# Patient Record
Sex: Female | Born: 1935 | Race: White | Hispanic: No | Smoking: Never smoker
Health system: Southern US, Community
[De-identification: ages and names within clinical notes are randomized; demographics above are authoritative.]

## PROBLEM LIST (undated history)

## (undated) DIAGNOSIS — E039 Hypothyroidism, unspecified: Secondary | ICD-10-CM

## (undated) DIAGNOSIS — I5030 Unspecified diastolic (congestive) heart failure: Secondary | ICD-10-CM

## (undated) DIAGNOSIS — N289 Disorder of kidney and ureter, unspecified: Secondary | ICD-10-CM

## (undated) DIAGNOSIS — I779 Disorder of arteries and arterioles, unspecified: Secondary | ICD-10-CM

## (undated) DIAGNOSIS — J45909 Unspecified asthma, uncomplicated: Secondary | ICD-10-CM

## (undated) DIAGNOSIS — I480 Paroxysmal atrial fibrillation: Secondary | ICD-10-CM

## (undated) DIAGNOSIS — E785 Hyperlipidemia, unspecified: Secondary | ICD-10-CM

## (undated) DIAGNOSIS — I10 Essential (primary) hypertension: Secondary | ICD-10-CM

## (undated) DIAGNOSIS — Z974 Presence of external hearing-aid: Secondary | ICD-10-CM

## (undated) HISTORY — PX: APPENDECTOMY: SHX54

## (undated) HISTORY — DX: Paroxysmal atrial fibrillation: I48.0

## (undated) HISTORY — PX: ABDOMINAL HYSTERECTOMY: SHX81

## (undated) HISTORY — PX: OTHER SURGICAL HISTORY: SHX169

## (undated) HISTORY — DX: Unspecified diastolic (congestive) heart failure: I50.30

## (undated) HISTORY — DX: Disorder of arteries and arterioles, unspecified: I77.9

---

## 2001-08-15 ENCOUNTER — Encounter: Payer: Self-pay | Admitting: Neurosurgery

## 2001-08-18 ENCOUNTER — Encounter: Payer: Self-pay | Admitting: Neurosurgery

## 2001-08-18 ENCOUNTER — Inpatient Hospital Stay (HOSPITAL_COMMUNITY): Admission: RE | Admit: 2001-08-18 | Discharge: 2001-08-19 | Payer: Self-pay | Admitting: Neurosurgery

## 2003-09-11 ENCOUNTER — Other Ambulatory Visit: Payer: Self-pay

## 2004-04-23 ENCOUNTER — Ambulatory Visit: Payer: Self-pay | Admitting: Urology

## 2004-04-30 ENCOUNTER — Ambulatory Visit: Payer: Self-pay | Admitting: Urology

## 2004-06-18 ENCOUNTER — Ambulatory Visit: Payer: Self-pay | Admitting: Urology

## 2004-06-25 ENCOUNTER — Ambulatory Visit: Payer: Self-pay | Admitting: Urology

## 2004-10-12 ENCOUNTER — Emergency Department: Payer: Self-pay | Admitting: Unknown Physician Specialty

## 2004-10-22 ENCOUNTER — Ambulatory Visit: Payer: Self-pay | Admitting: Urology

## 2004-10-22 ENCOUNTER — Other Ambulatory Visit: Payer: Self-pay

## 2004-10-29 ENCOUNTER — Ambulatory Visit: Payer: Self-pay | Admitting: Urology

## 2005-06-16 ENCOUNTER — Ambulatory Visit: Payer: Self-pay | Admitting: Urology

## 2006-06-20 ENCOUNTER — Ambulatory Visit: Payer: Self-pay | Admitting: Urology

## 2007-06-19 ENCOUNTER — Ambulatory Visit: Payer: Self-pay | Admitting: Urology

## 2008-06-24 ENCOUNTER — Ambulatory Visit: Payer: Self-pay | Admitting: Urology

## 2009-07-02 ENCOUNTER — Ambulatory Visit: Payer: Self-pay | Admitting: Urology

## 2009-10-22 ENCOUNTER — Ambulatory Visit: Payer: Self-pay | Admitting: Family Medicine

## 2009-10-28 ENCOUNTER — Ambulatory Visit: Payer: Self-pay | Admitting: Internal Medicine

## 2009-11-11 ENCOUNTER — Ambulatory Visit: Payer: Self-pay | Admitting: Vascular Surgery

## 2009-11-19 ENCOUNTER — Inpatient Hospital Stay: Payer: Self-pay | Admitting: Vascular Surgery

## 2009-11-20 LAB — PATHOLOGY REPORT

## 2011-04-21 ENCOUNTER — Ambulatory Visit: Payer: Self-pay | Admitting: Urology

## 2011-10-27 ENCOUNTER — Ambulatory Visit: Payer: Self-pay | Admitting: Urology

## 2014-01-31 ENCOUNTER — Ambulatory Visit: Payer: Self-pay | Admitting: Unknown Physician Specialty

## 2017-07-12 ENCOUNTER — Emergency Department: Payer: Medicare Other

## 2017-07-12 ENCOUNTER — Other Ambulatory Visit: Payer: Self-pay

## 2017-07-12 ENCOUNTER — Observation Stay
Admission: EM | Admit: 2017-07-12 | Discharge: 2017-07-13 | Disposition: A | Discharge status: Home / Self Care | Payer: Medicare Other | Attending: Internal Medicine | Admitting: Internal Medicine

## 2017-07-12 DIAGNOSIS — Z23 Encounter for immunization: Secondary | ICD-10-CM | POA: Diagnosis not present

## 2017-07-12 DIAGNOSIS — I2721 Secondary pulmonary arterial hypertension: Secondary | ICD-10-CM | POA: Insufficient documentation

## 2017-07-12 DIAGNOSIS — I700 Atherosclerosis of aorta: Secondary | ICD-10-CM | POA: Insufficient documentation

## 2017-07-12 DIAGNOSIS — R0602 Shortness of breath: Secondary | ICD-10-CM

## 2017-07-12 DIAGNOSIS — R0902 Hypoxemia: Secondary | ICD-10-CM | POA: Diagnosis not present

## 2017-07-12 DIAGNOSIS — I110 Hypertensive heart disease with heart failure: Principal | ICD-10-CM | POA: Insufficient documentation

## 2017-07-12 DIAGNOSIS — I351 Nonrheumatic aortic (valve) insufficiency: Secondary | ICD-10-CM | POA: Diagnosis not present

## 2017-07-12 DIAGNOSIS — K8020 Calculus of gallbladder without cholecystitis without obstruction: Secondary | ICD-10-CM | POA: Insufficient documentation

## 2017-07-12 DIAGNOSIS — J209 Acute bronchitis, unspecified: Secondary | ICD-10-CM | POA: Insufficient documentation

## 2017-07-12 DIAGNOSIS — J40 Bronchitis, not specified as acute or chronic: Secondary | ICD-10-CM | POA: Diagnosis present

## 2017-07-12 DIAGNOSIS — K449 Diaphragmatic hernia without obstruction or gangrene: Secondary | ICD-10-CM | POA: Insufficient documentation

## 2017-07-12 DIAGNOSIS — E039 Hypothyroidism, unspecified: Secondary | ICD-10-CM | POA: Diagnosis not present

## 2017-07-12 DIAGNOSIS — I5031 Acute diastolic (congestive) heart failure: Secondary | ICD-10-CM | POA: Insufficient documentation

## 2017-07-12 DIAGNOSIS — Z79899 Other long term (current) drug therapy: Secondary | ICD-10-CM | POA: Diagnosis not present

## 2017-07-12 HISTORY — DX: Essential (primary) hypertension: I10

## 2017-07-12 LAB — CBC
HEMATOCRIT: 40.2 % (ref 35.0–47.0)
HEMOGLOBIN: 13.4 g/dL (ref 12.0–16.0)
MCH: 28.2 pg (ref 26.0–34.0)
MCHC: 33.4 g/dL (ref 32.0–36.0)
MCV: 84.4 fL (ref 80.0–100.0)
Platelets: 247 10*3/uL (ref 150–440)
RBC: 4.76 MIL/uL (ref 3.80–5.20)
RDW: 14.6 % — AB (ref 11.5–14.5)
WBC: 10.6 10*3/uL (ref 3.6–11.0)

## 2017-07-12 LAB — TROPONIN I: Troponin I: <0.03 ng/mL (ref <0.03)

## 2017-07-12 LAB — INFLUENZA PANEL BY PCR (TYPE A & B)
Influenza A By PCR: NEGATIVE
Influenza B By PCR: NEGATIVE

## 2017-07-12 LAB — BASIC METABOLIC PANEL
ANION GAP: 8 (ref 5–15)
BUN: 18 mg/dL (ref 6–20)
CO2: 26 mmol/L (ref 22–32)
Calcium: 9 mg/dL (ref 8.9–10.3)
Chloride: 108 mmol/L (ref 101–111)
Creatinine, Ser: 1.25 mg/dL — ABNORMAL HIGH (ref 0.44–1.00)
GFR calc Af Amer: 44 mL/min — ABNORMAL LOW (ref >60)
GFR calc non Af Amer: 38 mL/min — ABNORMAL LOW (ref >60)
GLUCOSE: 130 mg/dL — AB (ref 65–99)
Potassium: 4.1 mmol/L (ref 3.5–5.1)
Sodium: 142 mmol/L (ref 135–145)

## 2017-07-12 LAB — BRAIN NATRIURETIC PEPTIDE
B Natriuretic Peptide: 406 pg/mL — ABNORMAL HIGH (ref 0.0–100.0)
B Natriuretic Peptide: 435 pg/mL — ABNORMAL HIGH (ref 0.0–100.0)

## 2017-07-12 MED ORDER — ACETAMINOPHEN 650 MG RE SUPP: 650.0000 mg | Freq: Four times a day (QID) | RECTAL | Status: DC | PRN

## 2017-07-12 MED ORDER — CEFTRIAXONE SODIUM 1 G IJ SOLR
1.0000 g | Freq: Once | INTRAMUSCULAR | Status: AC
  Administered 2017-07-12: 1 g via INTRAVENOUS
  Filled 2017-07-12: qty 10

## 2017-07-12 MED ORDER — LEVOTHYROXINE SODIUM 100 MCG PO TABS
100.0000 ug | ORAL_TABLET | Freq: Every day | ORAL | Status: DC
  Administered 2017-07-12 – 2017-07-13 (×2): 100 ug via ORAL
  Filled 2017-07-12: qty 1
  Filled 2017-07-12: qty 2

## 2017-07-12 MED ORDER — HYDRALAZINE HCL 50 MG PO TABS
50.0000 mg | ORAL_TABLET | Freq: Three times a day (TID) | ORAL | Status: DC
  Administered 2017-07-12 – 2017-07-13 (×4): 50 mg via ORAL
  Filled 2017-07-12 (×4): qty 1

## 2017-07-12 MED ORDER — GUAIFENESIN ER 600 MG PO TB12
600.0000 mg | ORAL_TABLET | Freq: Two times a day (BID) | ORAL | Status: DC
  Administered 2017-07-12 – 2017-07-13 (×3): 600 mg via ORAL
  Filled 2017-07-12 (×5): qty 1

## 2017-07-12 MED ORDER — ALBUTEROL SULFATE (2.5 MG/3ML) 0.083% IN NEBU: 5.0000 mg | INHALATION_SOLUTION | Freq: Once | RESPIRATORY_TRACT | Status: DC

## 2017-07-12 MED ORDER — AZITHROMYCIN 500 MG PO TABS
250.0000 mg | ORAL_TABLET | Freq: Every day | ORAL | Status: DC
  Administered 2017-07-13: 250 mg via ORAL
  Filled 2017-07-12 (×2): qty 1

## 2017-07-12 MED ORDER — IPRATROPIUM-ALBUTEROL 0.5-2.5 (3) MG/3ML IN SOLN
RESPIRATORY_TRACT | Status: AC
  Administered 2017-07-12: 3 mL via RESPIRATORY_TRACT
  Filled 2017-07-12: qty 6

## 2017-07-12 MED ORDER — PNEUMOCOCCAL VAC POLYVALENT 25 MCG/0.5ML IJ INJ
0.5000 mL | INJECTION | INTRAMUSCULAR | Status: AC
  Administered 2017-07-13: 0.5 mL via INTRAMUSCULAR
  Filled 2017-07-12: qty 0.5

## 2017-07-12 MED ORDER — METHYLPREDNISOLONE SODIUM SUCC 125 MG IJ SOLR
125.0000 mg | Freq: Once | INTRAMUSCULAR | Status: AC
  Administered 2017-07-12: 125 mg via INTRAVENOUS
  Filled 2017-07-12: qty 2

## 2017-07-12 MED ORDER — IPRATROPIUM-ALBUTEROL 0.5-2.5 (3) MG/3ML IN SOLN
3.0000 mL | Freq: Once | RESPIRATORY_TRACT | Status: AC
Start: 2017-07-12 — End: 2017-07-12
  Administered 2017-07-12: 3 mL via RESPIRATORY_TRACT

## 2017-07-12 MED ORDER — ONDANSETRON HCL 4 MG/2ML IJ SOLN: 4.0000 mg | Freq: Four times a day (QID) | INTRAMUSCULAR | Status: DC | PRN

## 2017-07-12 MED ORDER — LISINOPRIL 20 MG PO TABS
40.0000 mg | ORAL_TABLET | Freq: Every day | ORAL | Status: DC
  Administered 2017-07-12 – 2017-07-13 (×2): 40 mg via ORAL
  Filled 2017-07-12: qty 4
  Filled 2017-07-12: qty 2

## 2017-07-12 MED ORDER — AMLODIPINE BESYLATE 10 MG PO TABS
10.0000 mg | ORAL_TABLET | Freq: Every day | ORAL | Status: DC
  Administered 2017-07-12 – 2017-07-13 (×2): 10 mg via ORAL
  Filled 2017-07-12: qty 1
  Filled 2017-07-12: qty 2

## 2017-07-12 MED ORDER — ACETAMINOPHEN 325 MG PO TABS: 650.0000 mg | ORAL_TABLET | Freq: Four times a day (QID) | ORAL | Status: DC | PRN

## 2017-07-12 MED ORDER — ENOXAPARIN SODIUM 40 MG/0.4ML ~~LOC~~ SOLN
40.0000 mg | SUBCUTANEOUS | Status: DC
  Administered 2017-07-12: 22:00:00 40 mg via SUBCUTANEOUS
  Filled 2017-07-12: qty 0.4

## 2017-07-12 MED ORDER — ATORVASTATIN CALCIUM 20 MG PO TABS
40.0000 mg | ORAL_TABLET | Freq: Every day | ORAL | Status: DC
  Administered 2017-07-13: 09:00:00 40 mg via ORAL
  Filled 2017-07-12: qty 2

## 2017-07-12 MED ORDER — ALBUTEROL SULFATE (2.5 MG/3ML) 0.083% IN NEBU: 2.5000 mg | INHALATION_SOLUTION | RESPIRATORY_TRACT | Status: DC | PRN

## 2017-07-12 MED ORDER — FUROSEMIDE 20 MG PO TABS
20.0000 mg | ORAL_TABLET | Freq: Every day | ORAL | Status: DC
  Administered 2017-07-12 – 2017-07-13 (×2): 20 mg via ORAL
  Filled 2017-07-12 (×2): qty 1

## 2017-07-12 MED ORDER — SODIUM CHLORIDE 0.9 % IV SOLN
500.0000 mg | Freq: Once | INTRAVENOUS | Status: AC
  Administered 2017-07-12: 500 mg via INTRAVENOUS
  Filled 2017-07-12: qty 500

## 2017-07-12 MED ORDER — ONDANSETRON HCL 4 MG PO TABS: 4.0000 mg | ORAL_TABLET | Freq: Four times a day (QID) | ORAL | Status: DC | PRN

## 2017-07-12 MED ORDER — SENNOSIDES-DOCUSATE SODIUM 8.6-50 MG PO TABS: 1.0000 | ORAL_TABLET | Freq: Every evening | ORAL | Status: DC | PRN

## 2017-07-12 MED ORDER — IOHEXOL 350 MG/ML SOLN
60.0000 mL | Freq: Once | INTRAVENOUS | Status: AC | PRN
  Administered 2017-07-12: 60 mL via INTRAVENOUS

## 2017-07-12 MED ORDER — CARVEDILOL 25 MG PO TABS
25.0000 mg | ORAL_TABLET | Freq: Two times a day (BID) | ORAL | Status: DC
  Administered 2017-07-12 – 2017-07-13 (×3): 25 mg via ORAL
  Filled 2017-07-12 (×3): qty 1

## 2017-07-12 NOTE — ED Provider Notes (Signed)
[facility] Emergency Department Provider Note    First MD Initiated Contact with Patient 07/12/17 [phone]     (approximate)  I have reviewed the triage vital signs and the nursing notes.   HISTORY  Chief Complaint Shortness of Breath    HPI Joan Willis is a 84 y.o. female presents to the emergency department 3-day history of progressive dyspnea, orthopnea, nonproductive cough nasal congestion.  Patient presents to the emergency department tachypnea, arrival to room the patient's oxygen saturation was 88% on room air 94% on nasal cannula.  Past Medical History Hypertension  There are no active problems to display for this patient.   Past surgical history Appendectomy Hysterectomy  Prior to Admission medications   Not on File    Allergies No known drug allergies No family history on file.  Social History Social History   Tobacco Use  . Smoking status: Not on file  Substance Use Topics  . Alcohol use: Not on file  . Drug use: Not on file    Review of Systems Constitutional: No fever/chills Eyes: No visual changes. ENT: No sore throat. Cardiovascular: Denies chest pain. Respiratory: Positive for shortness of breath. Gastrointestinal: No abdominal pain.  No nausea, no vomiting.  No diarrhea.  No constipation. Genitourinary: Negative for dysuria. Musculoskeletal: Negative for neck pain.  Negative for back pain. Integumentary: Negative for rash. Neurological: Negative for headaches, focal weakness or numbness.   ____________________________________________   PHYSICAL EXAM:  VITAL SIGNS: ED Triage Vitals [07/12/17 0518]  Enc Vitals Group     BP (!) 188/55     Pulse Rate 62     Resp (!) 22     Temp 98.2 F (36.8 C)     Temp Source Oral     SpO2 94 %     Weight 77.1 kg (170 lb)     Height 1.651 m (5\' 5" )     Head Circumference      Peak Flow      Pain Score 0     Pain Loc      Pain Edu?      Excl. in GC?      Constitutional: Alert and oriented. Well appearing and in no acute distress. Eyes: Conjunctivae are normal.  Head: Atraumatic. Mouth/Throat: Mucous membranes are moist.  Oropharynx non-erythematous. Neck: No stridor.   Cardiovascular: Normal rate, regular rhythm. Good peripheral circulation. Grossly normal heart sounds. Respiratory: Tachypnea, positive accessory respiratory muscle use, diffuse rhonchi and wheezing bilaterally. Gastrointestinal: Soft and nontender. No distention.  Musculoskeletal: No lower extremity tenderness nor edema. No gross deformities of extremities.  1+ bilateral lower extremity pitting edema Neurologic:  Normal speech and language. No gross focal neurologic deficits are appreciated.  Skin:  Skin is warm, dry and intact. No rash noted. Psychiatric: Mood and affect are normal. Speech and behavior are normal.  ____________________________________________   LABS (all labs ordered are listed, but only abnormal results are displayed)  Labs Reviewed  BASIC METABOLIC PANEL - Abnormal; Notable for the following components:      Result Value   Glucose, Bld 130 (*)    Creatinine, Ser 1.25 (*)    GFR calc non Af Amer 38 (*)    GFR calc Af Amer 44 (*)    All other components within normal limits  CBC - Abnormal; Notable for the following components:   RDW 14.6 (*)    All other components within normal limits  TROPONIN I  BRAIN NATRIURETIC PEPTIDE  ____________________________________________  EKG  ED ECG REPORT I, [place] Mand[place] N Beyla Loney, the attending physician, personally viewed and interpreted this ECG.   Date: 07/12/2017  EKG Time: 5:39 AM  Rate: 61  Rhythm: Normal sinus rhythm  Axis: Normal  Intervals: Normal  ST&T Change: None  ____________________________________________  RADIOLOGY I, Conroy N Jonesha Tsuchiya, personally viewed and evaluated these images (plain radiographs) as part of my medical decision making, as well as reviewing the written report by  the radiologist.  ED MD interpretation: Interstitial coarsening concerning for possible pneumonia versus bronchitis  Official radiology report(s): Dg Chest 2 View  Result Date: 07/12/2017 CLINICAL DATA:  84 year old female with shortness of breath. EXAM: CHEST - 2 VIEW COMPARISON:  Chest radiograph dated 11/11/2009 FINDINGS: There is diffuse chronic interstitial coarsening, and bronchitic changes. Mild interstitial and peribronchial nodularity noted which may represent superimposed pneumonia. Clinical correlation is recommended. There is no focal consolidation, pleural effusion, or pneumothorax. There is mild cardiomegaly. Atherosclerotic calcification of the aortic arch. No acute osseous pathology. IMPRESSION: Diffuse interstitial coarsening with possible superimposed infection. Clinical correlation is recommended. Electronically Signed   By: Elgie Collard M.D.   On: 07/12/2017 05:48     Procedures   ____________________________________________   INITIAL IMPRESSION / ASSESSMENT AND PLAN / ED COURSE  As part of my medical decision making, I reviewed the following data within the electronic MEDICAL RECORD NUMBER   Patient received 2 DuoNeb's with minimal improvement in dyspnea however patient's oxygen saturation improved currently 96%.  X-ray concerning for pneumonia versus bronchitis also consider the possibility of a pulmonary emboli and as such CT scan of the chest will be performed.  Patient discussed with Dr. Katheren Shams hospitalist on call.  Patient care transferred to Dr. Mayford Knife who will notify hospitalist when CT scan results are finalized.  ____________________________________________  FINAL CLINICAL IMPRESSION(S) / ED DIAGNOSES  Final diagnoses:  SOB (shortness of breath)     MEDICATIONS GIVEN DURING THIS VISIT:  Medications  cefTRIAXone (ROCEPHIN) 1 g in sodium chloride 0.9 % 100 mL IVPB (1 g Intravenous New Bag/Given 07/12/17 0717)  azithromycin (ZITHROMAX) 500 mg in sodium  chloride 0.9 % 250 mL IVPB (has no administration in time range)  ipratropium-albuterol (DUONEB) 0.5-2.5 (3) MG/3ML nebulizer solution 3 mL (3 mLs Nebulization Given 07/12/17 0544)  ipratropium-albuterol (DUONEB) 0.5-2.5 (3) MG/3ML nebulizer solution 3 mL (3 mLs Nebulization Given 07/12/17 0545)  methylPREDNISolone sodium succinate (SOLU-MEDROL) 125 mg/2 mL injection 125 mg (125 mg Intravenous Given 07/12/17 1610)     ED Discharge Orders    None       Note:  This document was prepared using Dragon voice recognition software and may include unintentional dictation errors.    Darci Current, MD 07/12/17 [phone]

## 2017-07-12 NOTE — ED Notes (Signed)
Attempted to call report on pt, per charge RN pt has not been assigned to a nurse, will have RN call me back in 10 minutes

## 2017-07-12 NOTE — Care Management Obs Status (Signed)
MEDICARE OBSERVATION STATUS NOTIFICATION   Patient Details  Name: Christene Lyeauline S Kinslow MRN: [id number] Date of Birth: Jun 02, 1929   Medicare Observation Status Notification Given:  Yes    Collie Siadngela Tawsha Terrero, RN 07/12/2017, 8:44 AM

## 2017-07-12 NOTE — ED Notes (Signed)
Judeth CornfieldStephanie called to take pt to the floor.

## 2017-07-12 NOTE — ED Triage Notes (Signed)
Pt presents to ED with shortness of breath for the past "couple of days". Pt states last night she was unable to lay down. +nasal congestion. Denies cough and fever. Pt states she feels like she has chest congestion.

## 2017-07-12 NOTE — H&P (Addendum)
[facility] [place] [facility]   PATIENT NAME: Joan Willis    MR#:  [id number]  DATE OF BIRTH:  Oct 30, 1929  DATE OF ADMISSION:  07/12/2017  PRIMARY CARE PHYSICIAN: Leanna SatoMiles, Linda M, MD   REQUESTING/REFERRING PHYSICIAN: Dr. Manson PasseyBrown  CHIEF COMPLAINT:   Cough, nasal congestion and shortness of breath for 2 days HISTORY OF PRESENT ILLNESS:  Joan Willis  is a 84 y.o. female with a known history of hypertension, hypothyroidism comes to the emergency room with increasing shortness of breath, nasal congestion and dry cough.  Workup in the ER showed patient was hypoxic at 88% on room air.  Currently during my evaluation she is 96-97% on room air.    Chest x-ray shows changes of acute bronchitis.  CT chest negative for PE.  No pneumonia.  Some bilateral atelectasis noted. Pt is being admitted for malignant HTN (BP 177/54), hypoxia and SOB. BNP and Flu test pending. Patient received a dose of IV Solu-Medrol, Rocephin and Zithromax and duo nebs.  She is feeling a lot better.  PAST MEDICAL HISTORY:   Past Medical History:  Diagnosis Date  . Hypertension     PAST SURGICAL HISTOIRY:  The histories are not reviewed yet. Please review them in the "History" navigator section and refresh this SmartLink.  SOCIAL HISTORY:   Social History   Tobacco Use  . Smoking status: Not on file  Substance Use Topics  . Alcohol use: Not on file    FAMILY HISTORY:  No family history on file.  DRUG ALLERGIES:  No Known Allergies  REVIEW OF SYSTEMS:  Review of Systems  Constitutional: Negative for chills, fever and weight loss.  HENT: Negative for ear discharge, ear pain and nosebleeds.   Eyes: Negative for blurred vision, pain and discharge.  Respiratory: Positive for cough and shortness of breath. Negative for sputum production, wheezing and stridor.   Cardiovascular: Negative for chest pain, palpitations, orthopnea and PND.  Gastrointestinal: Negative for abdominal  pain, diarrhea, nausea and vomiting.  Genitourinary: Negative for frequency and urgency.  Musculoskeletal: Negative for back pain and joint pain.  Neurological: Negative for sensory change, speech change, focal weakness and weakness.  Psychiatric/Behavioral: Negative for depression and hallucinations. The patient is not nervous/anxious.      MEDICATIONS AT HOME:   Prior to Admission medications   Medication Sig Start Date End Date Taking? Authorizing Provider  amLODipine (NORVASC) 10 MG tablet Take 10 mg by mouth daily. 05/04/17  Yes [provider]  atorvastatin (LIPITOR) 40 MG tablet Take 40 mg by mouth daily. 04/18/17  Yes [provider]  carvedilol (COREG) 25 MG tablet Take 25 mg by mouth 2 (two) times daily. 04/19/17  Yes [provider]  furosemide (LASIX) 20 MG tablet Take 20 mg by mouth daily. 04/29/17  Yes [provider]  hydrALAZINE (APRESOLINE) 50 MG tablet Take 50 mg by mouth 3 (three) times daily. 07/05/17  Yes [provider]  levothyroxine (SYNTHROID, LEVOTHROID) 100 MCG tablet Take 100 mcg by mouth daily. 04/29/17  Yes [provider]  lisinopril (PRINIVIL,ZESTRIL) 40 MG tablet Take 40 mg by mouth daily. 06/28/17  Yes [provider]      VITAL SIGNS:  Blood pressure (!) 170/54, pulse 60, temperature 98.2 F (36.8 C), temperature source Oral, resp. rate 15, height 5\' 5"  (1.651 m), weight 77.1 kg (170 lb), SpO2 95 %.  PHYSICAL EXAMINATION:  GENERAL:  84 y.o.-year-old patient lying in the bed with no acute distress.  EYES: Pupils equal, round, reactive to light and accommodation. No scleral icterus. Extraocular muscles intact.  HEENT: Head atraumatic, normocephalic. Oropharynx and nasopharynx clear.  NECK:  Supple, no jugular venous distention. No thyroid enlargement, no tenderness.  LUNGS: Normal breath sounds bilaterally, no wheezing, rales,rhonchi or crepitation. No use of accessory muscles of respiration.   CARDIOVASCULAR: S1, S2 normal. No murmurs, rubs, or gallops.  ABDOMEN: Soft, nontender, nondistended. Bowel sounds present. No organomegaly or mass.  EXTREMITIES: No pedal edema, cyanosis, or clubbing.  NEUROLOGIC: Cranial nerves II through XII are intact. Muscle strength 5/5 in all extremities. Sensation intact. Gait not checked.  PSYCHIATRIC: The patient is alert and oriented x 3.  SKIN: No obvious rash, lesion, or ulcer.   LABORATORY PANEL:   CBC Recent Labs  Lab 07/12/17 0540  WBC 10.6  HGB 13.4  HCT 40.2  PLT 247   ------------------------------------------------------------------------------------------------------------------  Chemistries  Recent Labs  Lab 07/12/17 0540  NA 142  K 4.1  CL 108  CO2 26  GLUCOSE 130*  BUN 18  CREATININE 1.25*  CALCIUM 9.0   ------------------------------------------------------------------------------------------------------------------  Cardiac Enzymes Recent Labs  Lab 07/12/17 0540  TROPONINI <0.03   ------------------------------------------------------------------------------------------------------------------  RADIOLOGY:  Dg Chest 2 View  Result Date: 07/12/2017 CLINICAL DATA:  84 year old female with shortness of breath. EXAM: CHEST - 2 VIEW COMPARISON:  Chest radiograph dated 11/11/2009 FINDINGS: There is diffuse chronic interstitial coarsening, and bronchitic changes. Mild interstitial and peribronchial nodularity noted which may represent superimposed pneumonia. Clinical correlation is recommended. There is no focal consolidation, pleural effusion, or pneumothorax. There is mild cardiomegaly. Atherosclerotic calcification of the aortic arch. No acute osseous pathology. IMPRESSION: Diffuse interstitial coarsening with possible superimposed infection. Clinical correlation is recommended. Electronically Signed   By: Elgie Collard M.D.   On: 07/12/2017 05:48   Ct Angio Chest Pe W And/or Wo Contrast  Result Date:  07/12/2017 CLINICAL DATA:  Shortness of Breath EXAM: CT ANGIOGRAPHY CHEST WITH CONTRAST TECHNIQUE: Multidetector CT imaging of the chest was performed using the standard protocol during bolus administration of intravenous contrast. Multiplanar CT image reconstructions and MIPs were obtained to evaluate the vascular anatomy. CONTRAST:  60mL OMNIPAQUE IOHEXOL 350 MG/ML SOLN COMPARISON:  Chest radiograph July 12, 2017 FINDINGS: Cardiovascular: There is no demonstrable pulmonary embolus. No thoracic aortic aneurysm or dissection evident. There are scattered foci of calcification in the proximal great vessels. There are multiple foci of calcification in the thoracic aorta. There are scattered foci of coronary artery calcification. There is no appreciable pericardial effusion or pericardial thickening. Main pulmonary outflow tract measures 3.3 cm in diameter, a finding concerning for a degree of pulmonary arterial hypertension. Mediastinum/Nodes: Thyroid appears diminutive. No thyroid lesion evident. There is no appreciable thoracic adenopathy. There is a small hiatal hernia. Lungs/Pleura: There is no edema or consolidation. There is atelectatic change in both lower lobes. There are scattered areas of mild scarring bilaterally, most notably in the right upper lobe medially and anteriorly. No appreciable pleural effusion or pleural thickening. Upper Abdomen: There is cholelithiasis. There is aortic atherosclerosis. There is a cyst arising from the upper pole the right kidney measuring 2.7 x 2.7 cm. Musculoskeletal: There is degenerative change in the thoracic spine. There are no blastic or lytic bone lesions. No evident chest wall lesions. Review of the MIP images confirms the above findings. IMPRESSION: 1. No demonstrable pulmonary embolus. No thoracic aortic aneurysm or dissection. There are foci of atherosclerotic calcification in great vessels and scattered foci of coronary artery calcification. There  is aortic  atherosclerosis. 2. Prominence of the main pulmonary outflow tract, felt to represent a degree of pulmonary arterial hypertension. 3. Areas of atelectatic change in both lower lung zones. Scattered areas of scarring bilaterally. No frank edema or consolidation. 4.  No appreciable thoracic adenopathy. 5.  Small hiatal hernia. 6.  Cholelithiasis. Aortic Atherosclerosis (ICD10-I70.0). Electronically Signed   By: Bretta Bang III M.D.   On: 07/12/2017 08:00    EKG:    IMPRESSION AND PLAN:   Joan Willis  is a 84 y.o. female with a known history of hypertension, hypothyroidism comes to the emergency room with increasing shortness of breath, nasal congestion and dry cough.  Workup in the ER showed patient was hypoxic at 88% on room air.   1.  Acute hypoxia with shortness of breath and acute bronchitis -PRN duo nebs, Zithromax, incentive spirometer. -Hold off on further doses of steroid.  Patient has no underlying history of COPD. -Monitor sats as needed oxygen -Check BMP and flu test  2.  Malignant hypertension Continue all home meds- -PRN IV hydralazine as needed  3.  Hypothyroidism -Continue Synthroid  4.  DVT prophylaxis subcu Lovenox  Above Was discussed with patient patient's daughter in the ER   All the records are reviewed and case discussed with ED provider. Management plans discussed with the patient, family and they are in agreement.  CODE STATUS: full TOTAL TIME TAKING CARE OF THIS PATIENT: 45 minutes.    Enedina Finner M.D on 07/12/2017 at 8:58 AM  Between 7am to 6pm - Pager - [phone]  After 6pm go to [email or web address] - password [email or web address] [facility]  [facility]  Office  [phone]  CC: Primary care physician; Leanna Sato, MD

## 2017-07-12 NOTE — Care Management (Signed)
RNCM met with patient, her daughter and her son at bedside in ED.  Patient is resting and doesn't appear to be in distress. She is not requiring supplemental O2 at this time.  I have explained MOON letter and process. She has Medicaid also.  She is independent from home without any ambulatory assistance; drives.  She does not have any DME to assist with ambulatory needs.  Her PCP is Dr. Delight Stare with [facility].  She uses [facility] in [place] for medications and denies problems obtaining her medications.  She has never used Home health services. She denies RNCM needs.

## 2017-07-12 NOTE — Progress Notes (Signed)
Family Meeting Note  Advance Directive:yes  Today a meeting took place with the patient and daughter in the ER  The following were discussed:Patient's diagnosis: , Patient's progosis: Being admitted with hypoxia, bronchitis and malignant hypertension.  CODE STATUS discussed.  She wants to be full code.   Time spent during Vernelle EmeralddiscussionSona Myalynn Lingle, MD 16 mins

## 2017-07-13 ENCOUNTER — Observation Stay (HOSPITAL_BASED_OUTPATIENT_CLINIC_OR_DEPARTMENT_OTHER)
Admit: 2017-07-13 | Discharge: 2017-07-13 | Disposition: A | Discharge status: Home / Self Care | Payer: Medicare Other | Attending: Internal Medicine | Admitting: Internal Medicine

## 2017-07-13 DIAGNOSIS — I351 Nonrheumatic aortic (valve) insufficiency: Secondary | ICD-10-CM | POA: Diagnosis not present

## 2017-07-13 LAB — ECHOCARDIOGRAM COMPLETE
HEIGHTINCHES: 65 in
WEIGHTICAEL: 2777.6 [oz_av]

## 2017-07-13 MED ORDER — AZITHROMYCIN 250 MG PO TABS: 250.0000 mg | ORAL_TABLET | Freq: Every day | ORAL | 0 refills | Status: DC

## 2017-07-13 MED ORDER — FUROSEMIDE 10 MG/ML IJ SOLN
20.0000 mg | Freq: Once | INTRAMUSCULAR | Status: AC
  Administered 2017-07-13: 20 mg via INTRAVENOUS
  Filled 2017-07-13: qty 2

## 2017-07-13 MED ORDER — FUROSEMIDE 10 MG/ML IJ SOLN: 40.0000 mg | Freq: Once | INTRAMUSCULAR | Status: DC

## 2017-07-13 NOTE — Progress Notes (Signed)
Chaplain responded to an OR for and AD. Pt said didn't need on. Her husband had taken care of it 11 years ago.   07/13/17 1100  Clinical Encounter Type  Visited With Patient and family together  Visit Type Initial  Referral From Family;Nurse  Spiritual Encounters  Spiritual Needs Brochure;Prayer

## 2017-07-13 NOTE — Progress Notes (Signed)
Received MD order to discharge patient to home, reviewed homes meds discharge instructions and  And follow up appointments with patient and daughter and both verbalized understanding

## 2017-07-13 NOTE — Discharge Summary (Signed)
[facility] - [place] [facility]   PATIENT NAME: Joan Willis    MR#:  [id number]  DATE OF BIRTH:  [birth year]  DATE OF ADMISSION:  07/12/2017 ADMITTING PHYSICIAN: Joan Finner, MD  DATE OF DISCHARGE: 07/13/2017  PRIMARY CARE PHYSICIAN: Joan Sato, MD    ADMISSION DIAGNOSIS:  SOB (shortness of breath) [R06.02]  DISCHARGE DIAGNOSIS:  Acute hypoxic respiratory failure secondary to CHF exacerbation acute diastolic Mild bronchitis Malignant hypertension improved  SECONDARY DIAGNOSIS:   Past Medical History:  Diagnosis Date  . Hypertension     HOSPITAL COURSE:   Joan Willis  is a 84 y.o. female with a known history of hypertension, hypothyroidism comes to the emergency room with increasing shortness of breath, nasal congestion and dry cough.  Workup in the ER showed patient was hypoxic at 88% on room air.   1.  Acute hypoxia with shortness of breath secondary to acute diastolic congestive heart failure and acute bronchitis -PRN duo nebs, Zithromax, incentive spirometer. -Hold off on further doses of steroid.  Patient has no underlying history of COPD. -Monitor sats as needed oxygen -Influenza negative -Patient received IV Lasix.  She felt much better.  Sats 95% on room air -BNP elevated at 465.  Echo shows EF of 60-65%  2.  Malignant hypertension Continue all home meds- -PRN IV hydralazine as needed  3.  Hypothyroidism -Continue Synthroid  4.  DVT prophylaxis subcu Lovenox  Probably improved.  Sats stable.  Discussed with patient and daughter.  Patient will discharge to home with outpatient follow-up with Joan Willis mild  CONSULTS OBTAINED:    DRUG ALLERGIES:  No Known Allergies  DISCHARGE MEDICATIONS:   Allergies as of 07/13/2017   No Known Allergies     Medication List    TAKE these medications   amLODipine 10 MG tablet Commonly known as:  NORVASC Take 10 mg by mouth daily.   atorvastatin 40 MG tablet Commonly known as:   LIPITOR Take 40 mg by mouth daily.   azithromycin 250 MG tablet Commonly known as:  ZITHROMAX Take 1 tablet (250 mg total) by mouth daily. Start taking on:  07/14/2017   carvedilol 25 MG tablet Commonly known as:  COREG Take 25 mg by mouth 2 (two) times daily.   furosemide 20 MG tablet Commonly known as:  LASIX Take 20 mg by mouth daily.   hydrALAZINE 50 MG tablet Commonly known as:  APRESOLINE Take 50 mg by mouth 3 (three) times daily.   levothyroxine 100 MCG tablet Commonly known as:  SYNTHROID, LEVOTHROID Take 100 mcg by mouth daily.   lisinopril 40 MG tablet Commonly known as:  PRINIVIL,ZESTRIL Take 40 mg by mouth daily.       If you experience worsening of your admission symptoms, develop shortness of breath, life threatening emergency, suicidal or homicidal thoughts you must seek medical attention immediately by calling 911 or calling your MD immediately  if symptoms less severe.  You Must read complete instructions/literature along with all the possible adverse reactions/side effects for all the Medicines you take and that have been prescribed to you. Take any new Medicines after you have completely understood and accept all the possible adverse reactions/side effects.   Please note  You were cared for by a hospitalist during your hospital stay. If you have any questions about your discharge medications or the care you received while you were in the hospital after you are discharged, you can call the unit and asked to speak with  the hospitalist on call if the hospitalist that took care of you is not available. Once you are discharged, your primary care physician will handle any further medical issues. Please note that NO REFILLS for any discharge medications will be authorized once you are discharged, as it is imperative that you return to your primary care physician (or establish a relationship with a primary care physician if you do not have one) for your aftercare needs  so that they can reassess your need for medications and monitor your lab values. Today   SUBJECTIVE  I slept good and I am breathing much better   VITAL SIGNS:  Blood pressure (!) 140/54, pulse 63, temperature 98.6 F (37 C), temperature source Oral, resp. rate 16, height 5\' 5"  (1.651 m), weight 78.7 kg (173 lb 9.6 oz), SpO2 95 %.  I/O:    Intake/Output Summary (Last 24 hours) at 07/13/2017 1306 Last data filed at 07/13/2017 0900 Gross per 24 hour  Intake 720 ml  Output 0 ml  Net 720 ml    PHYSICAL EXAMINATION:  GENERAL:  84 y.o.-year-old patient lying in the bed with no acute distress.  EYES: Pupils equal, round, reactive to light and accommodation. No scleral icterus. Extraocular muscles intact.  HEENT: Head atraumatic, normocephalic. Oropharynx and nasopharynx clear.  NECK:  Supple, no jugular venous distention. No thyroid enlargement, no tenderness.  LUNGS: Normal breath sounds bilaterally, no wheezing, rales,rhonchi or crepitation. No use of accessory muscles of respiration.  CARDIOVASCULAR: S1, S2 normal. No murmurs, rubs, or gallops.  ABDOMEN: Soft, non-tender, non-distended. Bowel sounds present. No organomegaly or mass.  EXTREMITIES: No pedal edema, cyanosis, or clubbing.  NEUROLOGIC: Cranial nerves II through XII are intact. Muscle strength 5/5 in all extremities. Sensation intact. Gait not checked.  PSYCHIATRIC: The patient is alert and oriented x 3.  SKIN: No obvious rash, lesion, or ulcer.   DATA REVIEW:   CBC  Recent Labs  Lab 07/12/17 0540  WBC 10.6  HGB 13.4  HCT 40.2  PLT 247    Chemistries  Recent Labs  Lab 07/12/17 0540  NA 142  K 4.1  CL 108  CO2 26  GLUCOSE 130*  BUN 18  CREATININE 1.25*  CALCIUM 9.0    Microbiology Results   No results found for this or any previous visit (from the past 240 hour(s)).  RADIOLOGY:  Dg Chest 2 View  Result Date: 07/12/2017 CLINICAL DATA:  84 year old female with shortness of breath. EXAM: CHEST - 2  VIEW COMPARISON:  Chest radiograph dated 11/11/2009 FINDINGS: There is diffuse chronic interstitial coarsening, and bronchitic changes. Mild interstitial and peribronchial nodularity noted which may represent superimposed pneumonia. Clinical correlation is recommended. There is no focal consolidation, pleural effusion, or pneumothorax. There is mild cardiomegaly. Atherosclerotic calcification of the aortic arch. No acute osseous pathology. IMPRESSION: Diffuse interstitial coarsening with possible superimposed infection. Clinical correlation is recommended. Electronically Signed   By: Elgie CollardArash  Radparvar M.D.   On: 07/12/2017 05:48   Ct Angio Chest Pe W And/or Wo Contrast  Result Date: 07/12/2017 CLINICAL DATA:  Shortness of Breath EXAM: CT ANGIOGRAPHY CHEST WITH CONTRAST TECHNIQUE: Multidetector CT imaging of the chest was performed using the standard protocol during bolus administration of intravenous contrast. Multiplanar CT image reconstructions and MIPs were obtained to evaluate the vascular anatomy. CONTRAST:  60mL OMNIPAQUE IOHEXOL 350 MG/ML SOLN COMPARISON:  Chest radiograph July 12, 2017 FINDINGS: Cardiovascular: There is no demonstrable pulmonary embolus. No thoracic aortic aneurysm or dissection evident. There are scattered foci  of calcification in the proximal great vessels. There are multiple foci of calcification in the thoracic aorta. There are scattered foci of coronary artery calcification. There is no appreciable pericardial effusion or pericardial thickening. Main pulmonary outflow tract measures 3.3 cm in diameter, a finding concerning for a degree of pulmonary arterial hypertension. Mediastinum/Nodes: Thyroid appears diminutive. No thyroid lesion evident. There is no appreciable thoracic adenopathy. There is a small hiatal hernia. Lungs/Pleura: There is no edema or consolidation. There is atelectatic change in both lower lobes. There are scattered areas of mild scarring bilaterally, most notably  in the right upper lobe medially and anteriorly. No appreciable pleural effusion or pleural thickening. Upper Abdomen: There is cholelithiasis. There is aortic atherosclerosis. There is a cyst arising from the upper pole the right kidney measuring 2.7 x 2.7 cm. Musculoskeletal: There is degenerative change in the thoracic spine. There are no blastic or lytic bone lesions. No evident chest wall lesions. Review of the MIP images confirms the above findings. IMPRESSION: 1. No demonstrable pulmonary embolus. No thoracic aortic aneurysm or dissection. There are foci of atherosclerotic calcification in great vessels and scattered foci of coronary artery calcification. There is aortic atherosclerosis. 2. Prominence of the main pulmonary outflow tract, felt to represent a degree of pulmonary arterial hypertension. 3. Areas of atelectatic change in both lower lung zones. Scattered areas of scarring bilaterally. No frank edema or consolidation. 4.  No appreciable thoracic adenopathy. 5.  Small hiatal hernia. 6.  Cholelithiasis. Aortic Atherosclerosis (ICD10-I70.0). Electronically Signed   By: Bretta Bang III M.D.   On: 07/12/2017 08:00     Management plans discussed with the patient, family and they are in agreement.  CODE STATUS:     Code Status Orders  (From admission, onward)        Start     Ordered   07/12/17 1345  Full code  Continuous     07/12/17 1344    Code Status History    This patient has a current code status but no historical code status.      TOTAL TIME TAKING CARE OF THIS PATIENT: 40 minutes.    Joan Willis M.D on 07/13/2017 at 1:06 PM  Between 7am to 6pm - Pager - [phone] After 6pm go to [email or web address] - Social research officer, government  [facility]  Office  [phone]  CC: Primary care physician; Joan Sato, MD

## 2017-07-13 NOTE — Progress Notes (Signed)
*  PRELIMINARY RESULTS* Echocardiogram 2D Echocardiogram has been performed.  Joan Willis, Joan Willis 07/13/2017, 11:35 AM

## 2017-07-13 NOTE — Plan of Care (Signed)
  Problem: Education: Goal: Knowledge of General Education information will improve Outcome: Progressing   Problem: Health Behavior/Discharge Planning: Goal: Ability to manage health-related needs will improve Outcome: Progressing   Problem: Clinical Measurements: Goal: Ability to maintain clinical measurements within normal limits will improve Outcome: Progressing Goal: Will remain free from infection Outcome: Progressing Goal: Respiratory complications will improve Outcome: Progressing Goal: Cardiovascular complication will be avoided Outcome: Progressing   Problem: Activity: Goal: Risk for activity intolerance will decrease Outcome: Progressing   Problem: Coping: Goal: Level of anxiety will decrease Outcome: Progressing   Problem: Elimination: Goal: Will not experience complications related to urinary retention Outcome: Progressing   Problem: Safety: Goal: Ability to remain free from injury will improve Outcome: Progressing   Problem: Skin Integrity: Goal: Risk for impaired skin integrity will decrease Outcome: Progressing

## 2017-09-15 ENCOUNTER — Emergency Department: Payer: Medicare Other

## 2017-09-15 ENCOUNTER — Encounter: Payer: Self-pay | Admitting: Emergency Medicine

## 2017-09-15 ENCOUNTER — Other Ambulatory Visit: Payer: Self-pay

## 2017-09-15 ENCOUNTER — Inpatient Hospital Stay
Admission: EM | Admit: 2017-09-15 | Discharge: 2017-09-17 | DRG: 202 | Disposition: A | Discharge status: Home / Self Care | Payer: Medicare Other | Attending: Internal Medicine | Admitting: Internal Medicine

## 2017-09-15 DIAGNOSIS — Z90710 Acquired absence of both cervix and uterus: Secondary | ICD-10-CM | POA: Diagnosis not present

## 2017-09-15 DIAGNOSIS — N189 Chronic kidney disease, unspecified: Secondary | ICD-10-CM | POA: Diagnosis present

## 2017-09-15 DIAGNOSIS — Z79890 Hormone replacement therapy (postmenopausal): Secondary | ICD-10-CM

## 2017-09-15 DIAGNOSIS — I5030 Unspecified diastolic (congestive) heart failure: Secondary | ICD-10-CM | POA: Diagnosis present

## 2017-09-15 DIAGNOSIS — I130 Hypertensive heart and chronic kidney disease with heart failure and stage 1 through stage 4 chronic kidney disease, or unspecified chronic kidney disease: Secondary | ICD-10-CM | POA: Diagnosis present

## 2017-09-15 DIAGNOSIS — N179 Acute kidney failure, unspecified: Secondary | ICD-10-CM | POA: Diagnosis present

## 2017-09-15 DIAGNOSIS — Z87891 Personal history of nicotine dependence: Secondary | ICD-10-CM

## 2017-09-15 DIAGNOSIS — J302 Other seasonal allergic rhinitis: Secondary | ICD-10-CM | POA: Diagnosis present

## 2017-09-15 DIAGNOSIS — J45901 Unspecified asthma with (acute) exacerbation: Principal | ICD-10-CM | POA: Diagnosis present

## 2017-09-15 DIAGNOSIS — E039 Hypothyroidism, unspecified: Secondary | ICD-10-CM | POA: Diagnosis present

## 2017-09-15 DIAGNOSIS — R0603 Acute respiratory distress: Secondary | ICD-10-CM

## 2017-09-15 DIAGNOSIS — R062 Wheezing: Secondary | ICD-10-CM | POA: Diagnosis not present

## 2017-09-15 DIAGNOSIS — J40 Bronchitis, not specified as acute or chronic: Secondary | ICD-10-CM

## 2017-09-15 DIAGNOSIS — E785 Hyperlipidemia, unspecified: Secondary | ICD-10-CM | POA: Diagnosis present

## 2017-09-15 HISTORY — DX: Unspecified asthma, uncomplicated: J45.909

## 2017-09-15 HISTORY — DX: Hypothyroidism, unspecified: E03.9

## 2017-09-15 HISTORY — DX: Hyperlipidemia, unspecified: E78.5

## 2017-09-15 LAB — CBC WITH DIFFERENTIAL/PLATELET
BASOS ABS: 0 10*3/uL (ref 0–0.1)
BASOS PCT: 0 %
Eosinophils Absolute: 0 10*3/uL (ref 0–0.7)
Eosinophils Relative: 0 %
HEMATOCRIT: 38.1 % (ref 35.0–47.0)
Hemoglobin: 12.9 g/dL (ref 12.0–16.0)
LYMPHS PCT: 21 %
Lymphs Abs: 2.1 10*3/uL (ref 1.0–3.6)
MCH: 28.5 pg (ref 26.0–34.0)
MCHC: 33.8 g/dL (ref 32.0–36.0)
MCV: 84.5 fL (ref 80.0–100.0)
Monocytes Absolute: 0.7 10*3/uL (ref 0.2–0.9)
Monocytes Relative: 7 %
NEUTROS ABS: 7.2 10*3/uL — AB (ref 1.4–6.5)
Neutrophils Relative %: 72 %
Platelets: 249 10*3/uL (ref 150–440)
RBC: 4.51 MIL/uL (ref 3.80–5.20)
RDW: 15.6 % — AB (ref 11.5–14.5)
WBC: 10 10*3/uL (ref 3.6–11.0)

## 2017-09-15 LAB — BASIC METABOLIC PANEL
ANION GAP: 10 (ref 5–15)
BUN: 29 mg/dL — ABNORMAL HIGH (ref 6–20)
CALCIUM: 8.9 mg/dL (ref 8.9–10.3)
CO2: 23 mmol/L (ref 22–32)
Chloride: 107 mmol/L (ref 101–111)
Creatinine, Ser: 1.43 mg/dL — ABNORMAL HIGH (ref 0.44–1.00)
GFR, EST AFRICAN AMERICAN: 38 mL/min — AB (ref >60)
GFR, EST NON AFRICAN AMERICAN: 32 mL/min — AB (ref >60)
GLUCOSE: 104 mg/dL — AB (ref 65–99)
POTASSIUM: 3.8 mmol/L (ref 3.5–5.1)
Sodium: 140 mmol/L (ref 135–145)

## 2017-09-15 LAB — BLOOD GAS, ARTERIAL
ACID-BASE EXCESS: 2.9 mmol/L — AB (ref 0.0–2.0)
BICARBONATE: 25.7 mmol/L (ref 20.0–28.0)
FIO2: 0.21
MECHANICAL RATE: 14
O2 Saturation: 97.4 %
PATIENT TEMPERATURE: 37
PH ART: 7.5 — AB (ref 7.350–7.450)
pCO2 arterial: 33 mmHg (ref 32.0–48.0)
pO2, Arterial: 87 mmHg (ref 83.0–108.0)

## 2017-09-15 LAB — FIBRIN DERIVATIVES D-DIMER (ARMC ONLY): FIBRIN DERIVATIVES D-DIMER (ARMC): 855.06 ng{FEU}/mL — AB (ref 0.00–499.00)

## 2017-09-15 LAB — PROCALCITONIN

## 2017-09-15 LAB — BRAIN NATRIURETIC PEPTIDE: B Natriuretic Peptide: 266 pg/mL — ABNORMAL HIGH (ref 0.0–100.0)

## 2017-09-15 LAB — TROPONIN I

## 2017-09-15 MED ORDER — IPRATROPIUM-ALBUTEROL 0.5-2.5 (3) MG/3ML IN SOLN
3.0000 mL | Freq: Once | RESPIRATORY_TRACT | Status: AC
  Administered 2017-09-15: 3 mL via RESPIRATORY_TRACT

## 2017-09-15 MED ORDER — SODIUM CHLORIDE 0.9% FLUSH: 3.0000 mL | INTRAVENOUS | Status: DC | PRN

## 2017-09-15 MED ORDER — SODIUM CHLORIDE 0.9% FLUSH
3.0000 mL | Freq: Two times a day (BID) | INTRAVENOUS | Status: DC
  Administered 2017-09-15 – 2017-09-17 (×4): 3 mL via INTRAVENOUS

## 2017-09-15 MED ORDER — ACETAMINOPHEN 650 MG RE SUPP: 650.0000 mg | Freq: Four times a day (QID) | RECTAL | Status: DC | PRN

## 2017-09-15 MED ORDER — METHYLPREDNISOLONE SODIUM SUCC 125 MG IJ SOLR
60.0000 mg | Freq: Four times a day (QID) | INTRAMUSCULAR | Status: DC
  Administered 2017-09-15 – 2017-09-17 (×8): 60 mg via INTRAVENOUS
  Filled 2017-09-15 (×8): qty 2

## 2017-09-15 MED ORDER — ATORVASTATIN CALCIUM 20 MG PO TABS
40.0000 mg | ORAL_TABLET | Freq: Every day | ORAL | Status: DC
  Administered 2017-09-15 – 2017-09-17 (×3): 40 mg via ORAL
  Filled 2017-09-15 (×3): qty 2

## 2017-09-15 MED ORDER — CARVEDILOL 25 MG PO TABS
25.0000 mg | ORAL_TABLET | Freq: Two times a day (BID) | ORAL | Status: DC
  Administered 2017-09-15 – 2017-09-16 (×3): 25 mg via ORAL
  Filled 2017-09-15 (×3): qty 1

## 2017-09-15 MED ORDER — IOPAMIDOL (ISOVUE-370) INJECTION 76%
60.0000 mL | Freq: Once | INTRAVENOUS | Status: AC | PRN
  Administered 2017-09-15: 60 mL via INTRAVENOUS

## 2017-09-15 MED ORDER — GUAIFENESIN ER 600 MG PO TB12
600.0000 mg | ORAL_TABLET | Freq: Two times a day (BID) | ORAL | Status: DC
  Administered 2017-09-15 – 2017-09-17 (×5): 600 mg via ORAL
  Filled 2017-09-15 (×5): qty 1

## 2017-09-15 MED ORDER — MONTELUKAST SODIUM 10 MG PO TABS
10.0000 mg | ORAL_TABLET | Freq: Every day | ORAL | Status: DC
  Administered 2017-09-15 – 2017-09-17 (×3): 10 mg via ORAL
  Filled 2017-09-15 (×3): qty 1

## 2017-09-15 MED ORDER — HYDRALAZINE HCL 50 MG PO TABS
50.0000 mg | ORAL_TABLET | Freq: Three times a day (TID) | ORAL | Status: DC
  Administered 2017-09-15 – 2017-09-17 (×7): 50 mg via ORAL
  Filled 2017-09-15 (×7): qty 1

## 2017-09-15 MED ORDER — SODIUM CHLORIDE 0.9 % IV SOLN: 250.0000 mL | INTRAVENOUS | Status: DC | PRN

## 2017-09-15 MED ORDER — ONDANSETRON HCL 4 MG/2ML IJ SOLN: 4.0000 mg | Freq: Four times a day (QID) | INTRAMUSCULAR | Status: DC | PRN

## 2017-09-15 MED ORDER — DOXYCYCLINE HYCLATE 100 MG PO TABS
100.0000 mg | ORAL_TABLET | Freq: Two times a day (BID) | ORAL | Status: DC
  Administered 2017-09-15 (×2): 100 mg via ORAL
  Filled 2017-09-15 (×3): qty 1

## 2017-09-15 MED ORDER — ENOXAPARIN SODIUM 40 MG/0.4ML ~~LOC~~ SOLN
40.0000 mg | SUBCUTANEOUS | Status: DC
  Administered 2017-09-15: 20:00:00 40 mg via SUBCUTANEOUS
  Filled 2017-09-15: qty 0.4

## 2017-09-15 MED ORDER — ACETAMINOPHEN 325 MG PO TABS: 650.0000 mg | ORAL_TABLET | Freq: Four times a day (QID) | ORAL | Status: DC | PRN

## 2017-09-15 MED ORDER — AMLODIPINE BESYLATE 10 MG PO TABS
10.0000 mg | ORAL_TABLET | Freq: Every day | ORAL | Status: DC
  Administered 2017-09-15 – 2017-09-17 (×3): 10 mg via ORAL
  Filled 2017-09-15 (×3): qty 1

## 2017-09-15 MED ORDER — FUROSEMIDE 20 MG PO TABS
20.0000 mg | ORAL_TABLET | Freq: Every day | ORAL | Status: DC
  Administered 2017-09-15 – 2017-09-16 (×2): 20 mg via ORAL
  Filled 2017-09-15 (×2): qty 1

## 2017-09-15 MED ORDER — METHYLPREDNISOLONE SODIUM SUCC 125 MG IJ SOLR
125.0000 mg | Freq: Once | INTRAMUSCULAR | Status: AC
  Administered 2017-09-15: 125 mg via INTRAVENOUS
  Filled 2017-09-15: qty 2

## 2017-09-15 MED ORDER — ONDANSETRON HCL 4 MG PO TABS: 4.0000 mg | ORAL_TABLET | Freq: Four times a day (QID) | ORAL | Status: DC | PRN

## 2017-09-15 MED ORDER — LISINOPRIL 20 MG PO TABS
40.0000 mg | ORAL_TABLET | Freq: Every day | ORAL | Status: DC
  Administered 2017-09-15 – 2017-09-17 (×3): 40 mg via ORAL
  Filled 2017-09-15 (×3): qty 2

## 2017-09-15 MED ORDER — HYDROCODONE-ACETAMINOPHEN 5-325 MG PO TABS: 1.0000 | ORAL_TABLET | ORAL | Status: DC | PRN

## 2017-09-15 MED ORDER — IPRATROPIUM-ALBUTEROL 0.5-2.5 (3) MG/3ML IN SOLN
3.0000 mL | Freq: Four times a day (QID) | RESPIRATORY_TRACT | Status: DC
  Administered 2017-09-15 – 2017-09-17 (×7): 3 mL via RESPIRATORY_TRACT
  Filled 2017-09-15 (×8): qty 3

## 2017-09-15 MED ORDER — IPRATROPIUM-ALBUTEROL 0.5-2.5 (3) MG/3ML IN SOLN
RESPIRATORY_TRACT | Status: AC
  Administered 2017-09-15: 3 mL via RESPIRATORY_TRACT
  Filled 2017-09-15: qty 6

## 2017-09-15 MED ORDER — LEVOTHYROXINE SODIUM 100 MCG PO TABS
100.0000 ug | ORAL_TABLET | Freq: Every day | ORAL | Status: DC
  Administered 2017-09-16 – 2017-09-17 (×2): 100 ug via ORAL
  Filled 2017-09-15 (×4): qty 1

## 2017-09-15 MED ORDER — BUDESONIDE 0.25 MG/2ML IN SUSP
0.2500 mg | Freq: Two times a day (BID) | RESPIRATORY_TRACT | Status: DC
  Administered 2017-09-15 – 2017-09-17 (×4): 0.25 mg via RESPIRATORY_TRACT
  Filled 2017-09-15 (×4): qty 2

## 2017-09-15 NOTE — ED Triage Notes (Signed)
Patient ambulatory to triage with steady gait, without difficulty, audible wheezing and congested cough; family reports seen recently by PCP and rx prednisone and antbiotics for bronchitis

## 2017-09-15 NOTE — H&P (Addendum)
[facility] - [place] [facility]   PATIENT NAME: Joan Willis    MR#:  [id number]  DATE OF BIRTH:  [birth year]  DATE OF ADMISSION:  09/15/2017  PRIMARY CARE PHYSICIAN: Leanna SatoMiles, Linda M, MD   REQUESTING/REFERRING PHYSICIAN: Darci CurrentBrown, Goodhue N, MD  CHIEF COMPLAINT:   Chief Complaint  Patient presents with  . Cough  . Wheezing    HISTORY OF PRESENT ILLNESS: Joan Baarsauline Edmonds  is a 84 y.o. female with a known history of asthma, essential hypertension presenting with complaint of cough shortness of breath ongoing for the past few days.  Patient states that she was seen by her primary care provider and was given some breathing treatments in the office.  She was prescribed prednisone orally however she states that pharmacy said that that was not prescribed.  She continued to be very short of breath and continued to have wheezing and coughing.      PAST MEDICAL HISTORY:   Past Medical History:  Diagnosis Date  . Asthma   . Hyperlipemia   . Hypertension   . Hypothyroid     PAST SURGICAL HISTORY:  Past Surgical History:  Procedure Laterality Date  . ABDOMINAL HYSTERECTOMY    . APPENDECTOMY    . arm surgery    . cartotid endaractomy      SOCIAL HISTORY:  Social History   Tobacco Use  . Smoking status: Former Games developermoker  . Smokeless tobacco: Former Engineer, waterUser  Substance Use Topics  . Alcohol use: Not on file    FAMILY HISTORY:  Family History  Problem Relation Age of Onset  . Hypertension Mother     DRUG ALLERGIES: No Known Allergies  REVIEW OF SYSTEMS:   CONSTITUTIONAL: No fever, fatigue or weakness.  EYES: No blurred or double vision.  EARS, NOSE, AND THROAT: No tinnitus or ear pain.  RESPIRATORY: Positive cough, positive shortness of breath, positive wheezing or hemoptysis.  CARDIOVASCULAR: No chest pain, orthopnea, edema.  GASTROINTESTINAL: No nausea, vomiting, diarrhea or abdominal pain.  GENITOURINARY: No dysuria, hematuria.  ENDOCRINE: No polyuria,  nocturia,  HEMATOLOGY: No anemia, easy bruising or bleeding SKIN: No rash or lesion. MUSCULOSKELETAL: No joint pain or arthritis.   NEUROLOGIC: No tingling, numbness, weakness.  PSYCHIATRY: No anxiety or depression.   MEDICATIONS AT HOME:  Prior to Admission medications   Medication Sig Start Date End Date Taking? Authorizing Provider  amLODipine (NORVASC) 10 MG tablet Take 10 mg by mouth daily. 05/04/17  Yes [provider]  atorvastatin (LIPITOR) 40 MG tablet Take 40 mg by mouth daily. 04/18/17  Yes [provider]  carvedilol (COREG) 25 MG tablet Take 25 mg by mouth 2 (two) times daily. 04/19/17  Yes [provider]  furosemide (LASIX) 20 MG tablet Take 20 mg by mouth daily. 04/29/17  Yes [provider]  hydrALAZINE (APRESOLINE) 50 MG tablet Take 50 mg by mouth 3 (three) times daily. 07/05/17  Yes [provider]  levofloxacin (LEVAQUIN) 500 MG tablet Take 500 mg by mouth daily. 09/13/17  Yes [provider]  levothyroxine (SYNTHROID, LEVOTHROID) 100 MCG tablet Take 100 mcg by mouth daily. 04/29/17  Yes [provider]  lisinopril (PRINIVIL,ZESTRIL) 40 MG tablet Take 40 mg by mouth daily. 06/28/17  Yes [provider]  montelukast (SINGULAIR) 10 MG tablet Take 10 mg by mouth daily. 07/19/17  Yes [provider]  azithromycin (ZITHROMAX) 250 MG tablet Take 1 tablet (250 mg total) by mouth daily. Patient not taking: Reported on 09/15/2017 07/14/17  Enedina Finner, MD      PHYSICAL EXAMINATION:   VITAL SIGNS: Blood pressure (!) 174/60, pulse (!) 59, temperature 98 F (36.7 C), temperature source Oral, resp. rate 14, height 5\' 5"  (1.651 m), weight 77.1 kg (170 lb), SpO2 98 %.  GENERAL:  84 y.o.-year-old patient lying in the bed with no acute distress.  EYES: Pupils equal, round, reactive to light and accommodation. No scleral icterus. Extraocular muscles intact.  HEENT: Head atraumatic, normocephalic. Oropharynx and  nasopharynx clear.  NECK:  Supple, no jugular venous distention. No thyroid enlargement, no tenderness.  LUNGS: Bilateral wheezing throughout both lungs, no accessory muscle usage CARDIOVASCULAR: S1, S2 normal. No murmurs, rubs, or gallops.  ABDOMEN: Soft, nontender, nondistended. Bowel sounds present. No organomegaly or mass.  EXTREMITIES: No pedal edema, cyanosis, or clubbing.  NEUROLOGIC: Cranial nerves II through XII are intact. Muscle strength 5/5 in all extremities. Sensation intact. Gait not checked.  PSYCHIATRIC: The patient is alert and oriented x 3.  SKIN: No obvious rash, lesion, or ulcer.   LABORATORY PANEL:   CBC Recent Labs  Lab 09/15/17 0609  WBC 10.0  HGB 12.9  HCT 38.1  PLT 249  MCV 84.5  MCH 28.5  MCHC 33.8  RDW 15.6*  LYMPHSABS 2.1  MONOABS 0.7  EOSABS 0.0  BASOSABS 0.0   ------------------------------------------------------------------------------------------------------------------  Chemistries  Recent Labs  Lab 09/15/17 0609  NA 140  K 3.8  CL 107  CO2 23  GLUCOSE 104*  BUN 29*  CREATININE 1.43*  CALCIUM 8.9   ------------------------------------------------------------------------------------------------------------------ estimated creatinine clearance is 29.5 mL/min (A) (by C-G formula based on SCr of 1.43 mg/dL (H)). ------------------------------------------------------------------------------------------------------------------ No results for input(s): TSH, T4TOTAL, T3FREE, THYROIDAB in the last 72 hours.  Invalid input(s): FREET3   Coagulation profile No results for input(s): INR, PROTIME in the last 168 hours. ------------------------------------------------------------------------------------------------------------------- No results for input(s): DDIMER in the last 72 hours. -------------------------------------------------------------------------------------------------------------------  Cardiac Enzymes Recent Labs  Lab  09/15/17 0609  TROPONINI <0.03   ------------------------------------------------------------------------------------------------------------------ Invalid input(s): POCBNP  ---------------------------------------------------------------------------------------------------------------  Urinalysis No results found for: COLORURINE, APPEARANCEUR, LABSPEC, PHURINE, GLUCOSEU, HGBUR, BILIRUBINUR, KETONESUR, PROTEINUR, UROBILINOGEN, NITRITE, LEUKOCYTESUR   RADIOLOGY: Dg Chest 2 View  Result Date: 09/15/2017 CLINICAL DATA:  Acute onset of wheezing and congested cough. EXAM: CHEST - 2 VIEW COMPARISON:  Chest radiograph and CT of the chest performed 07/12/2017 FINDINGS: The lungs are well-aerated. Vascular congestion is noted. Peribronchial thickening is seen. Mild bibasilar scarring is noted. There is no evidence of pleural effusion or pneumothorax. The heart is mildly enlarged. No acute osseous abnormalities are seen. IMPRESSION: Vascular congestion and mild cardiomegaly. Peribronchial thickening seen. Mild bibasilar scarring noted. No focal airspace consolidation identified. Electronically Signed   By: Roanna Raider M.D.   On: 09/15/2017 06:30   Ct Angio Chest Pe W And/or Wo Contrast  Result Date: 09/15/2017 CLINICAL DATA:  Elevated D-dimer.  Dyspnea.  Cough. EXAM: CT ANGIOGRAPHY CHEST WITH CONTRAST TECHNIQUE: Multidetector CT imaging of the chest was performed using the standard protocol during bolus administration of intravenous contrast. Multiplanar CT image reconstructions and MIPs were obtained to evaluate the vascular anatomy. CONTRAST:  60mL ISOVUE-370 IOPAMIDOL (ISOVUE-370) INJECTION 76% COMPARISON:  Chest radiograph from earlier today. 07/12/2017 chest CT angiogram. FINDINGS: Cardiovascular: The study is high quality for the evaluation of pulmonary embolism. There are no filling defects in the central, lobar, segmental or subsegmental pulmonary artery branches to suggest acute pulmonary  embolism. Atherosclerotic nonaneurysmal thoracic aorta. Stable dilated main pulmonary artery (3.5 cm diameter). Stable mild  cardiomegaly. No significant pericardial fluid/thickening. Left anterior descending and right coronary atherosclerosis. Mediastinum/Nodes: No discrete thyroid nodules. Unremarkable esophagus. No pathologically enlarged axillary, mediastinal or hilar lymph nodes. Lungs/Pleura: No pneumothorax. No pleural effusion. Medial right lower lobe 4 mm solid pulmonary nodule, decreased from 7 mm on 07/12/2017. Left upper lobe 3 mm solid pulmonary nodule (series 6/image 27), stable since 07/12/2017. No acute consolidative airspace disease, lung masses or new significant pulmonary nodules. Upper abdomen: Small hiatal hernia. Cholelithiasis. Asymmetric right renal atrophy. Partially visualized exophytic hypodense 2.8 cm renal cortical lesion in posterior upper medial right kidney (series 4/image 99) with density 21 HU, stable since 07/12/2017. Musculoskeletal: No aggressive appearing focal osseous lesions. Moderate thoracic spondylosis. Review of the MIP images confirms the above findings. IMPRESSION: 1. No pulmonary embolism.  No acute pulmonary disease. 2. 2 tiny solid pulmonary nodules, largest 4 mm. No follow-up needed if patient is low-risk (and has no known or suspected primary neoplasm). Non-contrast chest CT can be considered in 12 months if patient is high-risk. This recommendation follows the consensus statement: Guidelines for Management of Incidental Pulmonary Nodules Detected on CT Images:From the Fleischner Society 2017; published online before print (10.1148/radiol.[id number]). 3. Indeterminate exophytic 2.8 cm renal cortical mass in the posterior upper right kidney, stable in size since 07/12/2017 CT. Recommend outpatient MRI (preferred) or CT abdomen without and with IV contrast for further characterization. 4. Mild cardiomegaly, stable. Stable dilated main pulmonary artery, suggesting  chronic pulmonary arterial hypertension. 5. Small hiatal hernia. 6. Cholelithiasis. 7. Two-vessel coronary atherosclerosis. Aortic Atherosclerosis (ICD10-I70.0). Electronically Signed   By: Delbert Phenix M.D.   On: 09/15/2017 08:21    EKG: Orders placed or performed during the hospital encounter of 09/15/17  . ED EKG  . ED EKG  . EKG 12-Lead  . EKG 12-Lead    IMPRESSION AND PLAN: Patient is 84 year old with history of asthma presenting with worsening shortness of breath wheezing  1.  Acute shortness of breath with wheezing This is related to acute asthma exasperation We will treat with nebulizer, steroids, placed on Pulmicort   2.  Essential hypertension continue amlodipine, Coreg and hydralazine   3. Hyperlipidemia continue Lipitor  4.  Seasonal allergies continue Singulair I will add Flonase  5.  Hypothyroidism continue Synthroid  All the records are reviewed and case discussed with ED provider. Management plans discussed with the patient, family and they are in agreement.  CODE STATUS: Code Status History    Date Active Date Inactive Code Status Order ID Comments User Context   07/12/2017 1344 07/13/2017 1900 Full Code [id number]  Enedina Finner, MD Inpatient       TOTAL TIME TAKING CARE OF THIS PATIENT:57minutes.    Auburn Bilberry M.D on 09/15/2017 at 9:35 AM  Between 7am to 6pm - Pager - [phone]  After 6pm go to [email or web address] - password [email or web address]  [facility] Office  [phone]  CC: Primary care physician; Leanna Sato, MD

## 2017-09-15 NOTE — ED Provider Notes (Signed)
She has persistent wheezing and shortness of breath and does not appear well enough to go home.  I will discuss with the hospitalist for admission.   Joan Willis, Mika Griffitts E, MD 09/15/17 [phone]

## 2017-09-15 NOTE — ED Notes (Signed)
Patient transported to CT 

## 2017-09-15 NOTE — ED Provider Notes (Signed)
[facility] Emergency Department Provider Note ______________________________________   First MD Initiated Contact with Patient 09/15/17 [phone]     (approximate)  I have reviewed the triage vital signs and the nursing notes.   HISTORY  Chief Complaint Cough and Wheezing    HPI Joan Willis is a 84 y.o. female with below list of chronic medical conditions presents emergency department with progressive dyspnea, cough times x3 days.  Patient denies any fever.  Patient states that she was seen by her primary care provider 2 days ago at which point IM steroids, antibiotics and multiple breathing treatments were administered with improvement of symptoms.  Patient states that she was prescribed prednisone however the pharmacy did not have 20 mg tablets and stock and as such she has not received any additional steroid treatment since seen her primary care provider.  Patient denies any chest pain.  Patient denies any lower extremity pain or swelling.   Past Medical History:  Diagnosis Date  . Asthma   . Hypertension     Patient Active Problem List   Diagnosis Date Noted  . Bronchitis 07/12/2017    History reviewed. No pertinent surgical history.  Prior to Admission medications   Medication Sig Start Date End Date Taking? Authorizing Provider  amLODipine (NORVASC) 10 MG tablet Take 10 mg by mouth daily. 05/04/17   [provider]  atorvastatin (LIPITOR) 40 MG tablet Take 40 mg by mouth daily. 04/18/17   [provider]  azithromycin (ZITHROMAX) 250 MG tablet Take 1 tablet (250 mg total) by mouth daily. 07/14/17   Enedina Finner, MD  carvedilol (COREG) 25 MG tablet Take 25 mg by mouth 2 (two) times daily. 04/19/17   [provider]  furosemide (LASIX) 20 MG tablet Take 20 mg by mouth daily. 04/29/17   [provider]  hydrALAZINE (APRESOLINE) 50 MG tablet Take 50 mg by mouth 3 (three) times daily. 07/05/17   [provider]    levothyroxine (SYNTHROID, LEVOTHROID) 100 MCG tablet Take 100 mcg by mouth daily. 04/29/17   [provider]  lisinopril (PRINIVIL,ZESTRIL) 40 MG tablet Take 40 mg by mouth daily. 06/28/17   [provider]    Allergies Patient has no known allergies.  No family history on file.  Social History Social History   Tobacco Use  . Smoking status: Former Games developer  . Smokeless tobacco: Former Engineer, water Use Topics  . Alcohol use: Not on file  . Drug use: Not on file    Review of Systems Constitutional: No fever/chills Eyes: No visual changes. ENT: No sore throat. Cardiovascular: Denies chest pain. Respiratory: Positive for shortness of breath and cough. Gastrointestinal: No abdominal pain.  No nausea, no vomiting.  No diarrhea.  No constipation. Genitourinary: Negative for dysuria. Musculoskeletal: Negative for neck pain.  Negative for back pain. Integumentary: Negative for rash. Neurological: Negative for headaches, focal weakness or numbness.   ____________________________________________   PHYSICAL EXAM:  VITAL SIGNS: ED Triage Vitals  Enc Vitals Group     BP 09/15/17 0555 (!) 187/69     Pulse Rate 09/15/17 0555 61     Resp 09/15/17 0555 (!) 24     Temp 09/15/17 0555 98 F (36.7 C)     Temp Source 09/15/17 0555 Oral     SpO2 09/15/17 0555 97 %     Weight 09/15/17 0550 77.1 kg (170 lb)     Height 09/15/17 0550 1.651 m (5\' 5" )     Head Circumference --  Peak Flow --      Pain Score 09/15/17 0550 0     Pain Loc --      Pain Edu? --      Excl. in GC? --     Constitutional: Alert and oriented. Well appearing and in no acute distress. Eyes: Conjunctivae are normal.  Head: Atraumatic. Mouth/Throat: Mucous membranes are moist.  Oropharynx non-erythematous. Neck: No stridor.   Cardiovascular: Normal rate, regular rhythm. Good peripheral circulation. Grossly normal heart sounds. Respiratory: Normal respiratory effort.  No retractions. Lungs  CTAB. Gastrointestinal: Soft and nontender. No distention.  Musculoskeletal: No lower extremity tenderness nor edema. No gross deformities of extremities. Neurologic:  Normal speech and language. No gross focal neurologic deficits are appreciated.  Skin:  Skin is warm, dry and intact. No rash noted. Psychiatric: Mood and affect are normal. Speech and behavior are normal.  ____________________________________________   LABS (all labs ordered are listed, but only abnormal results are displayed)  Labs Reviewed  CBC WITH DIFFERENTIAL/PLATELET - Abnormal; Notable for the following components:      Result Value   RDW 15.6 (*)    Neutro Abs 7.2 (*)    All other components within normal limits  BASIC METABOLIC PANEL  BRAIN NATRIURETIC PEPTIDE  TROPONIN I  BLOOD GAS, ARTERIAL  FIBRIN DERIVATIVES D-DIMER ([facility] ONLY)   ____________________________________________  EKG  ED ECG REPORT I, Manderson N BROWN, the attending physician, personally viewed and interpreted this ECG.   Date: 09/15/2017  EKG Time: 6:18 AM  Rate: 53  Rhythm: Sinus bradycardia  Axis: Normal  Intervals: Normal  ST&T Change: None  ____________________________________________  RADIOLOGY I, [place]al[place] N BROWN, personally viewed and evaluated these images (plain radiographs) as part of my medical decision making, as well as reviewing the written report by the radiologist.  ED MD interpretation: Vascular congestion and mild cardiomegaly along with peribronchial thickening per radiologist  Official radiology report(s): Dg Chest 2 View  Result Date: 09/15/2017 CLINICAL DATA:  Acute onset of wheezing and congested cough. EXAM: CHEST - 2 VIEW COMPARISON:  Chest radiograph and CT of the chest performed 07/12/2017 FINDINGS: The lungs are well-aerated. Vascular congestion is noted. Peribronchial thickening is seen. Mild bibasilar scarring is noted. There is no evidence of pleural effusion or pneumothorax. The heart is mildly  enlarged. No acute osseous abnormalities are seen. IMPRESSION: Vascular congestion and mild cardiomegaly. Peribronchial thickening seen. Mild bibasilar scarring noted. No focal airspace consolidation identified. Electronically Signed   By: Roanna Raider M.D.   On: 09/15/2017 06:30     .Critical Care Performed by: Darci Current, MD Authorized by: Darci Current, MD   Critical care provider statement:    Critical care time (minutes):  30   Critical care time was exclusive of:  Separately billable procedures and treating other patients   Critical care was necessary to treat or prevent imminent or life-threatening deterioration of the following conditions:  Respiratory failure   Critical care was time spent personally by me on the following activities:  Development of treatment plan with patient or surrogate, discussions with consultants, evaluation of patient's response to treatment, examination of patient, obtaining history from patient or surrogate, ordering and performing treatments and interventions, ordering and review of laboratory studies, ordering and review of radiographic studies, pulse oximetry, re-evaluation of patient's condition and review of old charts   I assumed direction of critical care for this patient from another provider in my specialty: no       ____________________________________________  INITIAL IMPRESSION / ASSESSMENT AND PLAN / ED COURSE  As part of my medical decision making, I reviewed the following data within the electronic MEDICAL RECORD NUMBER 84 year old female presented with above-stated history and physical exam secondary to dyspnea and cough.  Concern for possible chronic bronchitis, pneumonia versus CHF as such chest x-ray was performed which revealed above findings of peribronchial thickening secondary to possible bronchitis as well as mild cardiomegaly.  Patient given 2 DuoNeb's and IV Solu-Medrol with improvement of symptoms at this point.   Awaiting additional laboratory data.  Patient's care transferred to Dr. Mayford KnifeWilliams ____________________________________________  FINAL CLINICAL IMPRESSION(S) / ED DIAGNOSES  Final diagnoses:  Acute respiratory distress  Bronchitis  Diastolic congestive heart failure, unspecified HF chronicity (HCC)     MEDICATIONS GIVEN DURING THIS VISIT:  Medications  ipratropium-albuterol (DUONEB) 0.5-2.5 (3) MG/3ML nebulizer solution 3 mL (3 mLs Nebulization Given 09/15/17 0603)  ipratropium-albuterol (DUONEB) 0.5-2.5 (3) MG/3ML nebulizer solution 3 mL (3 mLs Nebulization Given 09/15/17 0603)  methylPREDNISolone sodium succinate (SOLU-MEDROL) 125 mg/2 mL injection 125 mg (125 mg Intravenous Given 09/15/17 [id number])     ED Discharge Orders    None       Note:  This document was prepared using Dragon voice recognition software and may include unintentional dictation errors.    Darci CurrentBrown, [place] N, MD 09/16/17 [phone]

## 2017-09-15 NOTE — Progress Notes (Signed)
Advanced care plan.  Purpose of the Encounter: CODE STATUS  Parties in Attendance:patient and daughter  Patient's Decision Capacity:intact  Subjective/Patient's story: Patient is a 84 year old female with asthma presenting with worsening shortness of breath cough and asthma exasperation   Objective/Medical story  I discussed with the patient regarding CODE STATUS explained her what involve CPR and intubation.  Patient wishes to be a full code  Goals of care determination:   Full code  CODE STATUS: Full code   Time spent discussing advanced care planning: 16 minutes

## 2017-09-15 NOTE — ED Notes (Signed)
Pt ambulated to the bathroom without difficulty.  

## 2017-09-15 NOTE — ED Provider Notes (Signed)
CT results IMPRESSION: 1. No pulmonary embolism.  No acute pulmonary disease. 2. 2 tiny solid pulmonary nodules, largest 4 mm. No follow-up needed if patient is low-risk (and has no known or suspected primary neoplasm). Non-contrast chest CT can be considered in 12 months if patient is high-risk. This recommendation follows the consensus statement: Guidelines for Management of Incidental Pulmonary Nodules Detected on CT Images:From the Fleischner Society 2017; published online before print (10.1148/[id number]). 3. Indeterminate exophytic 2.8 cm renal cortical mass in the posterior upper right kidney, stable in size since 07/12/2017 CT. Recommend outpatient MRI (preferred) or CT abdomen without and with IV contrast for further characterization. 4. Mild cardiomegaly, stable. Stable dilated main pulmonary artery, suggesting chronic pulmonary arterial hypertension. 5. Small hiatal hernia. 6. Cholelithiasis. 7. Two-vessel coronary atherosclerosis.  Aortic Atherosclerosis (ICD10-I70.0).     Emily FilbertWilliams, Jonathan E, MD 09/15/17 [phone]

## 2017-09-16 LAB — BASIC METABOLIC PANEL
Anion gap: 10 (ref 5–15)
BUN: 36 mg/dL — ABNORMAL HIGH (ref 6–20)
CALCIUM: 8.7 mg/dL — AB (ref 8.9–10.3)
CO2: 23 mmol/L (ref 22–32)
CREATININE: 1.63 mg/dL — AB (ref 0.44–1.00)
Chloride: 107 mmol/L (ref 101–111)
GFR calc Af Amer: 32 mL/min — ABNORMAL LOW (ref >60)
GFR calc non Af Amer: 28 mL/min — ABNORMAL LOW (ref >60)
GLUCOSE: 155 mg/dL — AB (ref 65–99)
Potassium: 4.1 mmol/L (ref 3.5–5.1)
Sodium: 140 mmol/L (ref 135–145)

## 2017-09-16 LAB — CBC
HCT: 37.6 % (ref 35.0–47.0)
Hemoglobin: 12.6 g/dL (ref 12.0–16.0)
MCH: 28.2 pg (ref 26.0–34.0)
MCHC: 33.5 g/dL (ref 32.0–36.0)
MCV: 84.3 fL (ref 80.0–100.0)
Platelets: 245 10*3/uL (ref 150–440)
RBC: 4.46 MIL/uL (ref 3.80–5.20)
RDW: 15.4 % — AB (ref 11.5–14.5)
WBC: 13.4 10*3/uL — ABNORMAL HIGH (ref 3.6–11.0)

## 2017-09-16 MED ORDER — CARVEDILOL 25 MG PO TABS
25.0000 mg | ORAL_TABLET | Freq: Two times a day (BID) | ORAL | Status: DC
  Administered 2017-09-16 – 2017-09-17 (×2): 25 mg via ORAL
  Filled 2017-09-16 (×2): qty 1

## 2017-09-16 MED ORDER — ENOXAPARIN SODIUM 30 MG/0.3ML ~~LOC~~ SOLN
30.0000 mg | SUBCUTANEOUS | Status: DC
  Administered 2017-09-16: 30 mg via SUBCUTANEOUS
  Filled 2017-09-16: qty 0.3

## 2017-09-16 MED ORDER — DOXYCYCLINE HYCLATE 100 MG PO TABS
100.0000 mg | ORAL_TABLET | Freq: Two times a day (BID) | ORAL | Status: DC
  Administered 2017-09-16 (×2): 100 mg via ORAL
  Filled 2017-09-16 (×4): qty 1

## 2017-09-16 NOTE — Progress Notes (Signed)
Lovenox dose adjustment. Patient ordered lovenox 40 mg subq daily. CrCl 29.6 ml/min  Will decrease lovenox to 30 mg subq daily per patient's renal function.  Thomasene Rippleavid Ladarren Steiner, PharmD, BCPS Clinical Pharmacist 09/16/2017

## 2017-09-16 NOTE — Progress Notes (Signed)
[employer] - [place] [facility]                                                                                                                                                                                  Patient Demographics   Joan Willis, is a 84 y.o. female, DOB - 10/02/1929, EAV:[id number]  Admit date - 09/15/2017   Admitting Physician Auburn BilberryShreyang Jenna Ardoin, MD  Outpatient Primary MD for the patient is Leanna SatoMiles, Linda M, MD   LOS - 1  Subjective: Patient still having cough and wheezing shortness of breath improved    Review of Systems:   CONSTITUTIONAL: No documented fever. No fatigue, weakness. No weight gain, no weight loss.  EYES: No blurry or double vision.  ENT: No tinnitus. No postnasal drip. No redness of the oropharynx.  RESPIRATORY: Positive cough, positive wheeze, no hemoptysis.  Positive dyspnea.  CARDIOVASCULAR: No chest pain. No orthopnea. No palpitations. No syncope.  GASTROINTESTINAL: No nausea, no vomiting or diarrhea. No abdominal pain. No melena or hematochezia.  GENITOURINARY: No dysuria or hematuria.  ENDOCRINE: No polyuria or nocturia. No heat or cold intolerance.  HEMATOLOGY: No anemia. No bruising. No bleeding.  INTEGUMENTARY: No rashes. No lesions.  MUSCULOSKELETAL: No arthritis. No swelling. No gout.  NEUROLOGIC: No numbness, tingling, or ataxia. No seizure-type activity.  PSYCHIATRIC: No anxiety. No insomnia. No ADD.    Vitals:   Vitals:   09/15/17 1931 09/15/17 2123 09/16/17 0511 09/16/17 0744  BP:  (!) 142/53 (!) 164/66   Pulse: (!) 54 (!) 55 65   Resp: 18 15 17    Temp:  98.6 F (37 C) 98.7 F (37.1 C)   TempSrc:  Oral Oral   SpO2: 94% 96% 95% 94%  Weight:      Height:        Wt Readings from Last 3 Encounters:  09/15/17 77.2 kg (170 lb 4.8 oz)  07/12/17 78.7 kg (173 lb 9.6 oz)    No intake or output data in the 24 hours ending 09/16/17 1203  Physical Exam:   GENERAL: Pleasant-appearing in no apparent distress.   HEAD, EYES, EARS, NOSE AND THROAT: Atraumatic, normocephalic. Extraocular muscles are intact. Pupils equal and reactive to light. Sclerae anicteric. No conjunctival injection. No oro-pharyngeal erythema.  NECK: Supple. There is no jugular venous distention. No bruits, no lymphadenopathy, no thyromegaly.  HEART: Regular rate and rhythm,. No murmurs, no rubs, no clicks.  LUNGS: Bilateral wheezing throughout both lungs no accessory muscle use.  ABDOMEN: Soft, flat, nontender, nondistended. Has good bowel sounds. No hepatosplenomegaly appreciated.  EXTREMITIES: No evidence of any cyanosis, clubbing, or peripheral edema.  +2 pedal and radial pulses bilaterally.  NEUROLOGIC:  The patient is alert, awake, and oriented x3 with no focal motor or sensory deficits appreciated bilaterally.  SKIN: Moist and warm with no rashes appreciated.  Psych: Not anxious, depressed LN: No inguinal LN enlargement    Antibiotics   Anti-infectives (From admission, onward)   Start     Dose/Rate Route Frequency Ordered Stop   09/16/17 0900  doxycycline (VIBRA-TABS) tablet 100 mg     100 mg Oral 2 times daily with meals 09/16/17 0848     09/15/17 1000  doxycycline (VIBRA-TABS) tablet 100 mg  Status:  Discontinued     100 mg Oral Every 12 hours 09/15/17 0924 09/16/17 0848      Medications   Scheduled Meds: . amLODipine  10 mg Oral Daily  . atorvastatin  40 mg Oral Daily  . budesonide (PULMICORT) nebulizer solution  0.25 mg Nebulization BID  . carvedilol  25 mg Oral BID WC  . doxycycline  100 mg Oral BID WC  . enoxaparin (LOVENOX) injection  30 mg Subcutaneous Q24H  . guaiFENesin  600 mg Oral BID  . hydrALAZINE  50 mg Oral TID  . ipratropium-albuterol  3 mL Nebulization Q6H  . levothyroxine  100 mcg Oral Daily  . lisinopril  40 mg Oral Daily  . methylPREDNISolone (SOLU-MEDROL) injection  60 mg Intravenous Q6H  . montelukast  10 mg Oral Daily  . sodium chloride flush  3 mL Intravenous Q12H   Continuous  Infusions: . sodium chloride     PRN Meds:.sodium chloride, acetaminophen **OR** acetaminophen, HYDROcodone-acetaminophen, ondansetron **OR** ondansetron (ZOFRAN) IV, sodium chloride flush   Data Review:   Micro Results No results found for this or any previous visit (from the past 240 hour(s)).  Radiology Reports Dg Chest 2 View  Result Date: 09/15/2017 CLINICAL DATA:  Acute onset of wheezing and congested cough. EXAM: CHEST - 2 VIEW COMPARISON:  Chest radiograph and CT of the chest performed 07/12/2017 FINDINGS: The lungs are well-aerated. Vascular congestion is noted. Peribronchial thickening is seen. Mild bibasilar scarring is noted. There is no evidence of pleural effusion or pneumothorax. The heart is mildly enlarged. No acute osseous abnormalities are seen. IMPRESSION: Vascular congestion and mild cardiomegaly. Peribronchial thickening seen. Mild bibasilar scarring noted. No focal airspace consolidation identified. Electronically Signed   By: Roanna Raider M.D.   On: 09/15/2017 06:30   Ct Angio Chest Pe W And/or Wo Contrast  Result Date: 09/15/2017 CLINICAL DATA:  Elevated D-dimer.  Dyspnea.  Cough. EXAM: CT ANGIOGRAPHY CHEST WITH CONTRAST TECHNIQUE: Multidetector CT imaging of the chest was performed using the standard protocol during bolus administration of intravenous contrast. Multiplanar CT image reconstructions and MIPs were obtained to evaluate the vascular anatomy. CONTRAST:  60mL ISOVUE-370 IOPAMIDOL (ISOVUE-370) INJECTION 76% COMPARISON:  Chest radiograph from earlier today. 07/12/2017 chest CT angiogram. FINDINGS: Cardiovascular: The study is high quality for the evaluation of pulmonary embolism. There are no filling defects in the central, lobar, segmental or subsegmental pulmonary artery branches to suggest acute pulmonary embolism. Atherosclerotic nonaneurysmal thoracic aorta. Stable dilated main pulmonary artery (3.5 cm diameter). Stable mild cardiomegaly. No significant  pericardial fluid/thickening. Left anterior descending and right coronary atherosclerosis. Mediastinum/Nodes: No discrete thyroid nodules. Unremarkable esophagus. No pathologically enlarged axillary, mediastinal or hilar lymph nodes. Lungs/Pleura: No pneumothorax. No pleural effusion. Medial right lower lobe 4 mm solid pulmonary nodule, decreased from 7 mm on 07/12/2017. Left upper lobe 3 mm solid pulmonary nodule (series 6/image 27), stable since 07/12/2017. No acute consolidative airspace disease, lung masses or new  significant pulmonary nodules. Upper abdomen: Small hiatal hernia. Cholelithiasis. Asymmetric right renal atrophy. Partially visualized exophytic hypodense 2.8 cm renal cortical lesion in posterior upper medial right kidney (series 4/image 99) with density 21 HU, stable since 07/12/2017. Musculoskeletal: No aggressive appearing focal osseous lesions. Moderate thoracic spondylosis. Review of the MIP images confirms the above findings. IMPRESSION: 1. No pulmonary embolism.  No acute pulmonary disease. 2. 2 tiny solid pulmonary nodules, largest 4 mm. No follow-up needed if patient is low-risk (and has no known or suspected primary neoplasm). Non-contrast chest CT can be considered in 12 months if patient is high-risk. This recommendation follows the consensus statement: Guidelines for Management of Incidental Pulmonary Nodules Detected on CT Images:From the Fleischner Society 2017; published online before print (10.1148/radiol.[id number]). 3. Indeterminate exophytic 2.8 cm renal cortical mass in the posterior upper right kidney, stable in size since 07/12/2017 CT. Recommend outpatient MRI (preferred) or CT abdomen without and with IV contrast for further characterization. 4. Mild cardiomegaly, stable. Stable dilated main pulmonary artery, suggesting chronic pulmonary arterial hypertension. 5. Small hiatal hernia. 6. Cholelithiasis. 7. Two-vessel coronary atherosclerosis. Aortic Atherosclerosis  (ICD10-I70.0). Electronically Signed   By: Delbert Phenix M.D.   On: 09/15/2017 08:21     CBC Recent Labs  Lab 09/15/17 0609 09/16/17 0558  WBC 10.0 13.4*  HGB 12.9 12.6  HCT 38.1 37.6  PLT 249 245  MCV 84.5 84.3  MCH 28.5 28.2  MCHC 33.8 33.5  RDW 15.6* 15.4*  LYMPHSABS 2.1  --   MONOABS 0.7  --   EOSABS 0.0  --   BASOSABS 0.0  --     Chemistries  Recent Labs  Lab 09/15/17 0609 09/16/17 0558  NA 140 140  K 3.8 4.1  CL 107 107  CO2 23 23  GLUCOSE 104* 155*  BUN 29* 36*  CREATININE 1.43* 1.63*  CALCIUM 8.9 8.7*   ------------------------------------------------------------------------------------------------------------------ estimated creatinine clearance is 25.9 mL/min (A) (by C-G formula based on SCr of 1.63 mg/dL (H)). ------------------------------------------------------------------------------------------------------------------ No results for input(s): HGBA1C in the last 72 hours. ------------------------------------------------------------------------------------------------------------------ No results for input(s): CHOL, HDL, LDLCALC, TRIG, CHOLHDL, LDLDIRECT in the last 72 hours. ------------------------------------------------------------------------------------------------------------------ No results for input(s): TSH, T4TOTAL, T3FREE, THYROIDAB in the last 72 hours.  Invalid input(s): FREET3 ------------------------------------------------------------------------------------------------------------------ No results for input(s): VITAMINB12, FOLATE, FERRITIN, TIBC, IRON, RETICCTPCT in the last 72 hours.  Coagulation profile No results for input(s): INR, PROTIME in the last 168 hours.  No results for input(s): DDIMER in the last 72 hours.  Cardiac Enzymes Recent Labs  Lab 09/15/17 0609  TROPONINI <0.03   ------------------------------------------------------------------------------------------------------------------ Invalid input(s):  POCBNP    Assessment & Plan   IMPRESSION AND PLAN: Patient is 84 year old with history of asthma presenting with worsening shortness of breath wheezing  1.  Acute shortness of breath with wheezing This is related to acute asthma exasperation Continue therapy with nebulizers steroids and oral antibiotics Patient with some improvement  2.  Essential hypertension continue amlodipine, Coreg and hydralazine  3. Hyperlipidemia continue Lipitor  4.  Seasonal allergies continue Singulair I will add Flonase  5.  Hypothyroidism continue Synthroid  6.  Acute kidney injury on chronic kidney disease Lasix monitor renal function     Code Status Orders  (From admission, onward)        Start     Ordered   09/15/17 1115  Full code  Continuous     09/15/17 1114    Code Status History    Date Active Date Inactive Code Status Order  ID Comments User Context   07/12/2017 1344 07/13/2017 1900 Full Code [id number]  Enedina Finner, MD Inpatient    Advance Directive Documentation     Most Recent Value  Type of Advance Directive  Healthcare Power of Attorney, Living will  Pre-existing out of facility DNR order (yellow form or pink MOST form)  -  "MOST" Form in Place?  -           Consults none  DVT Prophylaxis  Lovenox  Lab Results  Component Value Date   PLT 245 09/16/2017     Time Spent in minutes 35-minute greater than 50% of time spent in care coordination and counseling patient regarding the condition and plan of care.   Auburn Bilberry M.D on 09/16/2017 at 12:03 PM  Between 7am to 6pm - Pager - [phone]  After 6pm go to [email or web address] - Social research officer, government  [employer]   Office  [phone]

## 2017-09-17 LAB — BASIC METABOLIC PANEL
Anion gap: 7 (ref 5–15)
BUN: 44 mg/dL — ABNORMAL HIGH (ref 6–20)
CALCIUM: 8.3 mg/dL — AB (ref 8.9–10.3)
CO2: 23 mmol/L (ref 22–32)
CREATININE: 1.48 mg/dL — AB (ref 0.44–1.00)
Chloride: 107 mmol/L (ref 101–111)
GFR, EST AFRICAN AMERICAN: 36 mL/min — AB (ref >60)
GFR, EST NON AFRICAN AMERICAN: 31 mL/min — AB (ref >60)
Glucose, Bld: 138 mg/dL — ABNORMAL HIGH (ref 65–99)
Potassium: 4.3 mmol/L (ref 3.5–5.1)
Sodium: 137 mmol/L (ref 135–145)

## 2017-09-17 MED ORDER — PREDNISONE 10 MG (21) PO TBPK: ORAL_TABLET | ORAL | 0 refills | Status: DC

## 2017-09-17 MED ORDER — DOXYCYCLINE HYCLATE 100 MG PO TABS: 100.0000 mg | ORAL_TABLET | Freq: Two times a day (BID) | ORAL | 0 refills | Status: AC

## 2017-09-17 NOTE — Discharge Summary (Signed)
[facility] - [place] at [facility]   PATIENT NAME: Joan Willis    MR#:  [id number]  DATE OF BIRTH:  1930-02-10  DATE OF ADMISSION:  09/15/2017 ADMITTING PHYSICIAN: Auburn Bilberry, MD  DATE OF DISCHARGE: 09/17/2017   PRIMARY CARE PHYSICIAN: Leanna Sato, MD    ADMISSION DIAGNOSIS:  Acute respiratory distress [R06.03] Bronchitis [J40] Diastolic congestive heart failure, unspecified HF chronicity (HCC) [I50.30]  DISCHARGE DIAGNOSIS:  Active Problems:   Asthma flare   SECONDARY DIAGNOSIS:   Past Medical History:  Diagnosis Date  . Asthma   . Hyperlipemia   . Hypertension   . Hypothyroid     HOSPITAL COURSE:   Patient is 84 year old with history of asthma presenting with worsening shortness of breath wheezing  1.Acute shortness of breath with wheezing This is related to acute asthma exasperation Continue therapy with nebulizers steroids and oral antibiotics Patient with much improvement, walked in room without any oxygen or wheezing. D/c with oral taper steroids and Abx,.  2.Essential hypertension continue amlodipine, Coreg and hydralazine  3.Hyperlipidemia continue Lipitor  4.Seasonal allergies continue Singulair add Flonase  5.Hypothyroidism continue Synthroid  6.  Acute kidney injury on chronic kidney disease Lasix monitor renal function  7. Incidental findings on CT- Lung nodules and possible renal mass   Advised to repeat CT lung in 1 year - discuss this with PMD and Pulm clinic- educated daughter in room also.   Renal mass- As per daughter, pt have long standing hx of renal issues.She will discuss this finding with PMD and do further plans.   DISCHARGE CONDITIONS:   Stable.  CONSULTS OBTAINED:    DRUG ALLERGIES:  No Known Allergies  DISCHARGE MEDICATIONS:   Allergies as of 09/17/2017   No Known Allergies     Medication List    STOP taking these medications   azithromycin 250 MG tablet Commonly  known as:  ZITHROMAX   levofloxacin 500 MG tablet Commonly known as:  LEVAQUIN     TAKE these medications   amLODipine 10 MG tablet Commonly known as:  NORVASC Take 10 mg by mouth daily.   atorvastatin 40 MG tablet Commonly known as:  LIPITOR Take 40 mg by mouth daily.   carvedilol 25 MG tablet Commonly known as:  COREG Take 25 mg by mouth 2 (two) times daily.   doxycycline 100 MG tablet Commonly known as:  VIBRA-TABS Take 1 tablet (100 mg total) by mouth 2 (two) times daily with a meal for 3 days.   furosemide 20 MG tablet Commonly known as:  LASIX Take 20 mg by mouth daily.   hydrALAZINE 50 MG tablet Commonly known as:  APRESOLINE Take 50 mg by mouth 3 (three) times daily.   levothyroxine 100 MCG tablet Commonly known as:  SYNTHROID, LEVOTHROID Take 100 mcg by mouth daily.   lisinopril 40 MG tablet Commonly known as:  PRINIVIL,ZESTRIL Take 40 mg by mouth daily.   montelukast 10 MG tablet Commonly known as:  SINGULAIR Take 10 mg by mouth daily.   predniSONE 10 MG (21) Tbpk tablet Commonly known as:  STERAPRED UNI-PAK 21 TAB Take 6 tabs first day, 5 tab on day 2, then 4 on day 3rd, 3 tabs on day 4th , 2 tab on day 5th, and 1 tab on 6th day.        DISCHARGE INSTRUCTIONS:    Follow with PMD in 1 week,.  If you experience worsening of your admission symptoms, develop shortness of breath, life threatening emergency,  suicidal or homicidal thoughts you must seek medical attention immediately by calling 911 or calling your MD immediately  if symptoms less severe.  You Must read complete instructions/literature along with all the possible adverse reactions/side effects for all the Medicines you take and that have been prescribed to you. Take any new Medicines after you have completely understood and accept all the possible adverse reactions/side effects.   Please note  You were cared for by a hospitalist during your hospital stay. If you have any questions about  your discharge medications or the care you received while you were in the hospital after you are discharged, you can call the unit and asked to speak with the hospitalist on call if the hospitalist that took care of you is not available. Once you are discharged, your primary care physician will handle any further medical issues. Please note that NO REFILLS for any discharge medications will be authorized once you are discharged, as it is imperative that you return to your primary care physician (or establish a relationship with a primary care physician if you do not have one) for your aftercare needs so that they can reassess your need for medications and monitor your lab values.    Today   CHIEF COMPLAINT:   Chief Complaint  Patient presents with  . Cough  . Wheezing    HISTORY OF PRESENT ILLNESS:  Joan Willis  is a 84 y.o. female with a known history of asthma, essential hypertension presenting with complaint of cough shortness of breath ongoing for the past few days.  Patient states that she was seen by her primary care provider and was given some breathing treatments in the office.  She was prescribed prednisone orally however she states that pharmacy said that that was not prescribed.  She continued to be very short of breath and continued to have wheezing and coughing.   VITAL SIGNS:  Blood pressure (!) 155/62, pulse 61, temperature 98.1 F (36.7 C), temperature source Oral, resp. rate 18, height 5\' 5"  (1.651 m), weight 77.2 kg (170 lb 4.8 oz), SpO2 93 %.  I/O:    Intake/Output Summary (Last 24 hours) at 09/17/2017 1002 Last data filed at 09/16/2017 1700 Gross per 24 hour  Intake 600 ml  Output -  Net 600 ml    PHYSICAL EXAMINATION:  GENERAL:  84 y.o.-year-old patient lying in the bed with no acute distress.  EYES: Pupils equal, round, reactive to light and accommodation. No scleral icterus. Extraocular muscles intact.  HEENT: Head atraumatic, normocephalic. Oropharynx and  nasopharynx clear.  NECK:  Supple, no jugular venous distention. No thyroid enlargement, no tenderness.  LUNGS: Normal breath sounds bilaterally, no wheezing, rales,rhonchi or crepitation. No use of accessory muscles of respiration.  CARDIOVASCULAR: S1, S2 normal. No murmurs, rubs, or gallops.  ABDOMEN: Soft, non-tender, non-distended. Bowel sounds present. No organomegaly or mass.  EXTREMITIES: No pedal edema, cyanosis, or clubbing.  NEUROLOGIC: Cranial nerves II through XII are intact. Muscle strength 5/5 in all extremities. Sensation intact. Gait not checked.  PSYCHIATRIC: The patient is alert and oriented x 3.  SKIN: No obvious rash, lesion, or ulcer.   DATA REVIEW:   CBC Recent Labs  Lab 09/16/17 0558  WBC 13.4*  HGB 12.6  HCT 37.6  PLT 245    Chemistries  Recent Labs  Lab 09/17/17 0421  NA 137  K 4.3  CL 107  CO2 23  GLUCOSE 138*  BUN 44*  CREATININE 1.48*  CALCIUM 8.3*  Cardiac Enzymes Recent Labs  Lab 09/15/17 0609  TROPONINI <0.03    Microbiology Results  No results found for this or any previous visit.  RADIOLOGY:  No results found.  EKG:   Orders placed or performed during the hospital encounter of 09/15/17  . ED EKG  . ED EKG  . EKG 12-Lead  . EKG 12-Lead      Management plans discussed with the patient, family and they are in agreement.  CODE STATUS:     Code Status Orders  (From admission, onward)        Start     Ordered   09/15/17 1115  Full code  Continuous     09/15/17 1114    Code Status History    Date Active Date Inactive Code Status Order ID Comments User Context   07/12/2017 1344 07/13/2017 1900 Full Code [id number]  Enedina Finner, MD Inpatient    Advance Directive Documentation     Most Recent Value  Type of Advance Directive  Healthcare Power of Attorney, Living will  Pre-existing out of facility DNR order (yellow form or pink MOST form)  -  "MOST" Form in Place?  -      TOTAL TIME TAKING CARE OF THIS PATIENT:  35 minutes.    Altamese Dilling M.D on 09/17/2017 at 10:02 AM  Between 7am to 6pm - Pager - [phone]  After 6pm go to [email or web address] - password [email or web address]  [facility]  Office  [phone]  CC: Primary care physician; Leanna Sato, MD   Note: This dictation was prepared with Dragon dictation along with smaller phrase technology. Any transcriptional errors that result from this process are unintentional.

## 2017-09-17 NOTE — Progress Notes (Signed)
Patient discharged with daughter. Patient verbalized understanding of education. Patient with no complaints.

## 2017-09-26 ENCOUNTER — Other Ambulatory Visit: Payer: Self-pay | Admitting: Family Medicine

## 2017-09-26 DIAGNOSIS — N2889 Other specified disorders of kidney and ureter: Secondary | ICD-10-CM

## 2017-10-11 ENCOUNTER — Ambulatory Visit: Payer: Medicare Other | Admitting: Pulmonary Disease

## 2017-10-11 ENCOUNTER — Encounter: Payer: Self-pay | Admitting: Pulmonary Disease

## 2017-10-11 VITALS — BP 140/86 | HR 51 | Resp 16 | Ht 65.0 in | Wt 174.0 lb

## 2017-10-11 DIAGNOSIS — I5032 Chronic diastolic (congestive) heart failure: Secondary | ICD-10-CM

## 2017-10-11 DIAGNOSIS — R0609 Other forms of dyspnea: Secondary | ICD-10-CM

## 2017-10-11 DIAGNOSIS — J4530 Mild persistent asthma, uncomplicated: Secondary | ICD-10-CM

## 2017-10-11 MED ORDER — ALBUTEROL SULFATE HFA 108 (90 BASE) MCG/ACT IN AERS: 2.0000 | INHALATION_SPRAY | Freq: Four times a day (QID) | RESPIRATORY_TRACT | 3 refills | Status: AC | PRN

## 2017-10-11 MED ORDER — FLOVENT HFA 220 MCG/ACT IN AERO
2.0000 | INHALATION_SPRAY | Freq: Every day | RESPIRATORY_TRACT | 3 refills | Status: DC
Start: 2017-10-11 — End: 2018-01-17

## 2017-10-11 NOTE — Progress Notes (Signed)
PULMONARY CONSULT NOTE  Requesting MD/Service: [facility] hospitalists Date of initial consultation: 10/11/17 Reason for consultation: Post hospitalization for acute asthmatic bronchitis  PT PROFILE: 84 y.o. female never smoker hospitalized 06/06-06/08/19 with respiratory distress and DC dx of asthma exacerbation  DATA: CTA chest 07/12/17: No acute pulmonary problems. There is a cyst arising from the upper pole the right kidney measuring 2.7 x 2.7 cm. CTA chest 09/15/17: No acute pulmonary problems. Small hiatal hernia. Cholelithiasis. Partially visualized exophytic hypodense 2.8 cm renal cortical lesion in posterior upper medial right kidney stable since 07/12/2017  INTERVAL:  HPI:  Recent hospitalization as documented above. Was discharged home with a diagnosis of asthma flare. Had similar presentation in April when she was discharged with a dx of CHF. She has never smoked. She was employed in [employer], [employer] until retirement in 1998. She has no prior diagnosis of asthma. She does report prior episodes of "bronchitis" without prior hospitalizations. She was most recently discharged on Flovent and PRN albuterol which she has rarely used. Presently, she reports minimal DOE and denies significant cough, CP, sputum, hemoptysis. She has chronic LE edema.   Past Medical History:  Diagnosis Date  . Asthma   . Hyperlipemia   . Hypertension   . Hypothyroid     Past Surgical History:  Procedure Laterality Date  . ABDOMINAL HYSTERECTOMY    . APPENDECTOMY    . arm surgery    . cartotid endaractomy      MEDICATIONS: I have reviewed all medications and confirmed regimen as documented  Social History   Socioeconomic History  . Marital status: Widowed    Spouse name: Not on file  . Number of children: Not on file  . Years of education: Not on file  . Highest education level: Not on file  Occupational History  . Not on file  Social Needs  . Financial resource strain: Not on file  . Food  insecurity:    Worry: Not on file    Inability: Not on file  . Transportation needs:    Medical: Not on file    Non-medical: Not on file  Tobacco Use  . Smoking status: Never Smoker  . Smokeless tobacco: Never Used  . Tobacco comment: pt states she has never smoked  Substance and Sexual Activity  . Alcohol use: Never    Frequency: Never  . Drug use: Never  . Sexual activity: Not on file  Lifestyle  . Physical activity:    Days per week: Not on file    Minutes per session: Not on file  . Stress: Not on file  Relationships  . Social connections:    Talks on phone: Not on file    Gets together: Not on file    Attends religious service: Not on file    Active member of club or organization: Not on file    Attends meetings of clubs or organizations: Not on file    Relationship status: Not on file  . Intimate partner violence:    Fear of current or ex partner: Not on file    Emotionally abused: Not on file    Physically abused: Not on file    Forced sexual activity: Not on file  Other Topics Concern  . Not on file  Social History Narrative  . Not on file    Family History  Problem Relation Age of Onset  . Hypertension Mother     ROS: No fever, myalgias/arthralgias, unexplained weight loss or weight gain No new focal  weakness or sensory deficits No otalgia, hearing loss, visual changes, nasal and sinus symptoms, mouth and throat problems No neck pain or adenopathy No abdominal pain, N/V/D, diarrhea, change in bowel pattern No dysuria, change in urinary pattern   Vitals:   10/11/17 1410 10/11/17 1414  BP:  140/86  Pulse:  (!) 51  Resp: 16   SpO2:  97%  Weight: 174 lb (78.9 kg)   Height: 5\' 5"  (1.651 m)   RA   EXAM:  Gen: WDWN, No overt respiratory distress HEENT: NCAT, sclera white, oropharynx normal Neck: Supple without LAN, thyromegaly, JVD Lungs: breath sounds full, percussion normal, adventitious sounds: nnone Cardiovascular: RRR, no murmurs  noted Abdomen: Soft, nontender, normal BS Ext: without clubbing, cyanosis. 1+ symmetric ankle edema Neuro: CNs grossly intact, motor and sensory intact Skin: Limited exam, no lesions noted  DATA:   BMP Latest Ref Rng & Units 09/17/2017 09/16/2017 09/15/2017  Glucose 65 - 99 mg/dL 914(N138(H) 829(F155(H) 621(H104(H)  BUN 6 - 20 mg/dL 08(M44(H) 57(Q36(H) 46(N29(H)  Creatinine 0.44 - 1.00 mg/dL 6.29(B1.48(H) 2.84(X1.63(H) 3.24(M1.43(H)  Sodium 135 - 145 mmol/L 137 140 140  Potassium 3.5 - 5.1 mmol/L 4.3 4.1 3.8  Chloride 101 - 111 mmol/L 107 107 107  CO2 22 - 32 mmol/L 23 23 23   Calcium 8.9 - 10.3 mg/dL 8.3(L) 8.7(L) 8.9    CBC Latest Ref Rng & Units 09/16/2017 09/15/2017 07/12/2017  WBC 3.6 - 11.0 K/uL 13.4(H) 10.0 10.6  Hemoglobin 12.0 - 16.0 g/dL 01.012.6 27.212.9 53.613.4  Hematocrit 35.0 - 47.0 % 37.6 38.1 40.2  Platelets 150 - 440 K/uL 245 249 247    CXR:  Vascular congestion and mild cardiomegaly. Peribronchial thickening seen. Mild bibasilar scarring noted. No focal airspace consolidation identified  I have personally reviewed all chest radiographs reported above including CXRs and CT chest unless otherwise indicated  IMPRESSION:     ICD-10-CM   1. Probable mild persistent asthma without complication J45.30 Pulmonary Function Test [facility] Only  2. Episodic dyspnea R06.09 Pulmonary Function Test [facility] Only  3. Suspect a component of chronic diastolic congestive heart failure I50.32      PLAN:  Continue Flovent inhaler, 1 inhalation twice a day.  Rinse mouth after use  Continue albuterol inhaler as needed for increased shortness of breath, wheezing, chest tightness, cough  Continue nebulizer as second line rescue medicine if albuterol inhaler does not provide relief  Follow-up in 3 months with lung function tests (PFTs) prior to that visit  If at any time in the interim she develops acute respiratory or breathing problems, she is instructed to contact us.  If she is hospitalized for any respiratory problems, pulmonary medicine service  should be involved to help discern the precise diagnosis   Billy Fischeravid Simonds, MD PCCM service Mobile [phone] Pager [phone] 10/11/2017 2:42 PM

## 2017-10-11 NOTE — Patient Instructions (Signed)
Continue Flovent inhaler, 1 inhalation twice a day.  Rinse mouth after use  Continue albuterol inhaler as needed for increased shortness of breath, wheezing, chest tightness, cough  Continue nebulizer as second line rescue medicine if albuterol inhaler does not provide relief  Follow-up in 3 months with lung function tests (PFTs) prior to that visit  If at any time in the interim you develop acute respiratory or breathing problems.  Please contact us.  If you are hospitalized for any breathing problems, ask the hospital doctors to get pulmonary medicine service involved

## 2018-01-10 ENCOUNTER — Ambulatory Visit: Payer: Medicare Other | Attending: Pulmonary Disease

## 2018-01-10 DIAGNOSIS — J4530 Mild persistent asthma, uncomplicated: Secondary | ICD-10-CM | POA: Diagnosis not present

## 2018-01-10 DIAGNOSIS — J449 Chronic obstructive pulmonary disease, unspecified: Secondary | ICD-10-CM | POA: Insufficient documentation

## 2018-01-10 DIAGNOSIS — R0609 Other forms of dyspnea: Secondary | ICD-10-CM | POA: Diagnosis not present

## 2018-01-10 MED ORDER — ALBUTEROL SULFATE (2.5 MG/3ML) 0.083% IN NEBU
2.5000 mg | INHALATION_SOLUTION | Freq: Once | RESPIRATORY_TRACT | Status: AC
  Administered 2018-01-10: 2.5 mg via RESPIRATORY_TRACT
  Filled 2018-01-10: qty 3

## 2018-01-17 ENCOUNTER — Encounter: Payer: Self-pay | Admitting: Pulmonary Disease

## 2018-01-17 ENCOUNTER — Ambulatory Visit: Payer: Medicare Other | Admitting: Pulmonary Disease

## 2018-01-17 VITALS — BP 132/72 | HR 96 | Ht 60.0 in | Wt 176.4 lb

## 2018-01-17 DIAGNOSIS — J4540 Moderate persistent asthma, uncomplicated: Secondary | ICD-10-CM | POA: Diagnosis not present

## 2018-01-17 MED ORDER — FLOVENT HFA 220 MCG/ACT IN AERO
2.0000 | INHALATION_SPRAY | Freq: Every day | RESPIRATORY_TRACT | 10 refills | Status: AC
Start: 2018-01-17 — End: ?

## 2018-01-17 NOTE — Patient Instructions (Signed)
Continue Flovent 220 mcg inhaler.  You may continue to use this 1 puff twice a day or 2 puffs once a day, whichever is simpler for you  Continue albuterol inhaler as needed  Follow-up as needed

## 2018-01-19 NOTE — Progress Notes (Signed)
PULMONARY CONSULT NOTE  Primary provider: Darreld Mclean, [facility] health Date of initial consultation: 10/11/17 Reason for consultation: Post hospitalization for acute asthmatic bronchitis  PT PROFILE: 84 y.o. female never smoker hospitalized 06/06-06/08/19 with respiratory distress and DC dx of asthma exacerbation  DATA: CTA chest 07/12/17: No acute pulmonary problems. There is a cyst arising from the upper pole the right kidney measuring 2.7 x 2.7 cm. CTA chest 09/15/17: No acute pulmonary problems. Small hiatal hernia. Cholelithiasis. Partially visualized exophytic hypodense 2.8 cm renal cortical lesion in posterior upper medial right kidney stable since 07/12/2017 PFTs 01/10/18: FVC: 1.47 > 1.64 L (58 > 65 %pred), FEV1: 1.07 > 1.19 L (56 > 63 %pred), FEV1/FVC: 73%, TLC: 4.22 L (81 %pred), DLCO 78 %pred    INTERVAL: Last visit 10/11/17.  No major events since that time.  SUBJ:. This is a scheduled follow-up for persistent asthma to review results of PFTs.  These are documented above..  She has no new complaints.  She states that she is "a whole lot better".  She reports that she has "no wheezing".  She remains on Flovent inhaler and is using it compliantly.  She is rarely requiring albuterol rescue inhaler.  She has no new complaints.  She denies CP, fever, purulent sputum, hemoptysis, LE edema and calf tenderness.    Vitals:   01/17/18 1040  BP: 132/72  Pulse: 96  SpO2: (!) 46%  Weight: 176 lb 6.4 oz (80 kg)  Height: 5' (1.524 m)  RA   EXAM:  Gen: NAD HEENT: NCAT, sclera white Neck: No JVD Lungs: breath sounds full, no wheezes or other adventitious sounds Cardiovascular: RRR, no murmurs Abdomen: Soft, nontender, normal BS Ext: without clubbing, cyanosis, edema Neuro: grossly intact Skin: Limited exam, no lesions noted   DATA:   BMP Latest Ref Rng & Units 09/17/2017 09/16/2017 09/15/2017  Glucose 65 - 99 mg/dL 161(W) 960(A) 540(J)  BUN 6 - 20 mg/dL 81(X) 91(Y) 78(G)   Creatinine 0.44 - 1.00 mg/dL 9.56(O) 1.30(Q) 6.57(Q)  Sodium 135 - 145 mmol/L 137 140 140  Potassium 3.5 - 5.1 mmol/L 4.3 4.1 3.8  Chloride 101 - 111 mmol/L 107 107 107  CO2 22 - 32 mmol/L 23 23 23   Calcium 8.9 - 10.3 mg/dL 8.3(L) 8.7(L) 8.9    CBC Latest Ref Rng & Units 09/16/2017 09/15/2017 07/12/2017  WBC 3.6 - 11.0 K/uL 13.4(H) 10.0 10.6  Hemoglobin 12.0 - 16.0 g/dL 46.9 62.9 52.8  Hematocrit 35.0 - 47.0 % 37.6 38.1 40.2  Platelets 150 - 440 K/uL 245 249 247    CXR: No new film  I have personally reviewed all chest radiographs reported above including CXRs and CT chest unless otherwise indicated  IMPRESSION:     ICD-10-CM   1. Mild-moderate persistent asthma, well controlled J45.40    Her PFTs demonstrate mild to moderate obstruction.  Her asthma control is excellent on ICS only  PLAN:  Continue Flovent 220 mcg inhaler.  She is instructed that she may continue to use this 1 puff twice a day or 2 puffs once a day, whichever is simpler for her  Continue albuterol inhaler as needed  Follow-up as needed for any pulmonary or respiratory problems  Further prescriptions for Flovent can be filled through her primary care provider.   Billy Fischer, MD PCCM service Mobile [phone] Pager [phone] 01/19/2018 11:38 AM

## 2018-12-31 ENCOUNTER — Observation Stay
Admission: EM | Admit: 2018-12-31 | Discharge: 2019-01-01 | Disposition: A | Discharge status: Home / Self Care | Payer: Medicare Other | Attending: Internal Medicine | Admitting: Internal Medicine

## 2018-12-31 ENCOUNTER — Other Ambulatory Visit: Payer: Self-pay

## 2018-12-31 ENCOUNTER — Emergency Department: Payer: Medicare Other

## 2018-12-31 DIAGNOSIS — I4891 Unspecified atrial fibrillation: Secondary | ICD-10-CM | POA: Diagnosis not present

## 2018-12-31 DIAGNOSIS — E039 Hypothyroidism, unspecified: Secondary | ICD-10-CM | POA: Diagnosis not present

## 2018-12-31 DIAGNOSIS — I083 Combined rheumatic disorders of mitral, aortic and tricuspid valves: Secondary | ICD-10-CM | POA: Diagnosis not present

## 2018-12-31 DIAGNOSIS — Z20828 Contact with and (suspected) exposure to other viral communicable diseases: Secondary | ICD-10-CM | POA: Diagnosis not present

## 2018-12-31 DIAGNOSIS — I420 Dilated cardiomyopathy: Secondary | ICD-10-CM | POA: Insufficient documentation

## 2018-12-31 DIAGNOSIS — Z23 Encounter for immunization: Secondary | ICD-10-CM | POA: Insufficient documentation

## 2018-12-31 DIAGNOSIS — Z90710 Acquired absence of both cervix and uterus: Secondary | ICD-10-CM | POA: Diagnosis not present

## 2018-12-31 DIAGNOSIS — E785 Hyperlipidemia, unspecified: Secondary | ICD-10-CM | POA: Diagnosis present

## 2018-12-31 DIAGNOSIS — Z7951 Long term (current) use of inhaled steroids: Secondary | ICD-10-CM | POA: Insufficient documentation

## 2018-12-31 DIAGNOSIS — J45909 Unspecified asthma, uncomplicated: Secondary | ICD-10-CM | POA: Insufficient documentation

## 2018-12-31 DIAGNOSIS — R7989 Other specified abnormal findings of blood chemistry: Secondary | ICD-10-CM | POA: Diagnosis not present

## 2018-12-31 DIAGNOSIS — Z8249 Family history of ischemic heart disease and other diseases of the circulatory system: Secondary | ICD-10-CM | POA: Insufficient documentation

## 2018-12-31 DIAGNOSIS — I110 Hypertensive heart disease with heart failure: Secondary | ICD-10-CM | POA: Diagnosis not present

## 2018-12-31 DIAGNOSIS — R0602 Shortness of breath: Secondary | ICD-10-CM | POA: Diagnosis present

## 2018-12-31 DIAGNOSIS — I5032 Chronic diastolic (congestive) heart failure: Secondary | ICD-10-CM | POA: Insufficient documentation

## 2018-12-31 DIAGNOSIS — Z79890 Hormone replacement therapy (postmenopausal): Secondary | ICD-10-CM | POA: Insufficient documentation

## 2018-12-31 DIAGNOSIS — Z79899 Other long term (current) drug therapy: Secondary | ICD-10-CM | POA: Diagnosis not present

## 2018-12-31 DIAGNOSIS — I10 Essential (primary) hypertension: Secondary | ICD-10-CM | POA: Diagnosis present

## 2018-12-31 DIAGNOSIS — R079 Chest pain, unspecified: Secondary | ICD-10-CM

## 2018-12-31 LAB — CBC WITH DIFFERENTIAL/PLATELET
Abs Immature Granulocytes: 0.03 10*3/uL (ref 0.00–0.07)
Basophils Absolute: 0.1 10*3/uL (ref 0.0–0.1)
Basophils Relative: 1 %
Eosinophils Absolute: 0.3 10*3/uL (ref 0.0–0.5)
Eosinophils Relative: 4 %
HCT: 41.6 % (ref 36.0–46.0)
Hemoglobin: 13.4 g/dL (ref 12.0–15.0)
Immature Granulocytes: 0 %
Lymphocytes Relative: 25 %
Lymphs Abs: 2 10*3/uL (ref 0.7–4.0)
MCH: 28.5 pg (ref 26.0–34.0)
MCHC: 32.2 g/dL (ref 30.0–36.0)
MCV: 88.5 fL (ref 80.0–100.0)
Monocytes Absolute: 0.5 10*3/uL (ref 0.1–1.0)
Monocytes Relative: 6 %
Neutro Abs: 5.2 10*3/uL (ref 1.7–7.7)
Neutrophils Relative %: 64 %
Platelets: 243 10*3/uL (ref 150–400)
RBC: 4.7 MIL/uL (ref 3.87–5.11)
RDW: 14.6 % (ref 11.5–15.5)
WBC: 8.2 10*3/uL (ref 4.0–10.5)
nRBC: 0 % (ref 0.0–0.2)

## 2018-12-31 LAB — URINALYSIS, COMPLETE (UACMP) WITH MICROSCOPIC
Bacteria, UA: NONE SEEN
Bilirubin Urine: NEGATIVE
Glucose, UA: NEGATIVE mg/dL
Hgb urine dipstick: NEGATIVE
Ketones, ur: NEGATIVE mg/dL
Leukocytes,Ua: NEGATIVE
Nitrite: NEGATIVE
Protein, ur: 30 mg/dL — AB
Specific Gravity, Urine: 1.009 (ref 1.005–1.030)
pH: 8 (ref 5.0–8.0)

## 2018-12-31 LAB — APTT: aPTT: 29 seconds (ref 24–36)

## 2018-12-31 LAB — BASIC METABOLIC PANEL
Anion gap: 9 (ref 5–15)
BUN: 26 mg/dL — ABNORMAL HIGH (ref 8–23)
CO2: 27 mmol/L (ref 22–32)
Calcium: 8.7 mg/dL — ABNORMAL LOW (ref 8.9–10.3)
Chloride: 106 mmol/L (ref 98–111)
Creatinine, Ser: 1.71 mg/dL — ABNORMAL HIGH (ref 0.44–1.00)
GFR calc Af Amer: 31 mL/min — ABNORMAL LOW (ref >60)
GFR calc non Af Amer: 27 mL/min — ABNORMAL LOW (ref >60)
Glucose, Bld: 192 mg/dL — ABNORMAL HIGH (ref 70–99)
Potassium: 3.9 mmol/L (ref 3.5–5.1)
Sodium: 142 mmol/L (ref 135–145)

## 2018-12-31 LAB — PROTIME-INR
INR: 1 (ref 0.8–1.2)
Prothrombin Time: 13 seconds (ref 11.4–15.2)

## 2018-12-31 LAB — BRAIN NATRIURETIC PEPTIDE: B Natriuretic Peptide: 357 pg/mL — ABNORMAL HIGH (ref 0.0–100.0)

## 2018-12-31 LAB — TROPONIN I (HIGH SENSITIVITY): Troponin I (High Sensitivity): 11 ng/L (ref <18)

## 2018-12-31 LAB — TSH: TSH: 2.805 u[IU]/mL (ref 0.350–4.500)

## 2018-12-31 LAB — MAGNESIUM: Magnesium: 2.2 mg/dL (ref 1.7–2.4)

## 2018-12-31 MED ORDER — HEPARIN (PORCINE) 25000 UT/250ML-% IV SOLN
1000.0000 [IU]/h | INTRAVENOUS | Status: DC
  Administered 2018-12-31: 1000 [IU]/h via INTRAVENOUS
  Filled 2018-12-31: qty 250

## 2018-12-31 MED ORDER — METOPROLOL TARTRATE 5 MG/5ML IV SOLN
5.0000 mg | INTRAVENOUS | Status: DC | PRN
  Administered 2018-12-31 (×2): 5 mg via INTRAVENOUS
  Filled 2018-12-31 (×2): qty 5

## 2018-12-31 MED ORDER — METOPROLOL TARTRATE 25 MG PO TABS
25.0000 mg | ORAL_TABLET | Freq: Once | ORAL | Status: AC
  Administered 2018-12-31: 25 mg via ORAL
  Filled 2018-12-31: qty 1

## 2018-12-31 MED ORDER — HEPARIN BOLUS VIA INFUSION
3650.0000 [IU] | Freq: Once | INTRAVENOUS | Status: AC
  Administered 2018-12-31: 3650 [IU] via INTRAVENOUS
  Filled 2018-12-31: qty 3650

## 2018-12-31 NOTE — H&P (Signed)
[facility] -  at [facility]   PATIENT NAME: Joan Willis    MR#:  [id number]  DATE OF BIRTH:  Aug 07, 1929  DATE OF ADMISSION:  12/31/2018  PRIMARY CARE PHYSICIAN: Leanna SatoMiles, Linda M, MD   REQUESTING/REFERRING PHYSICIAN: Larinda ButteryJessup, MD  CHIEF COMPLAINT:   Chief Complaint  Patient presents with  . Chest Pain    HISTORY OF PRESENT ILLNESS:  Joan Baarsauline Jacquet  is a 85 y.o. female who presents with chief complaint as above.  Patient presents the ED with a complaint of chest discomfort and some shortness of breath.  She states that she did not have overt palpitations at any time.  She was at home when she developed some progressive shortness of breath and then some chest tightness.  Here in the ED she was found to be in A. fib with RVR, with initial rate up into the 140s.  She was given beta-blockers which brought her rate down fairly well, and her symptoms improved.  She does not have a prior diagnosis of A. fib, and hospitalist were called for admission  PAST MEDICAL HISTORY:   Past Medical History:  Diagnosis Date  . Asthma   . Hyperlipemia   . Hypertension   . Hypothyroid      PAST SURGICAL HISTORY:   Past Surgical History:  Procedure Laterality Date  . ABDOMINAL HYSTERECTOMY    . APPENDECTOMY    . arm surgery    . cartotid endaractomy       SOCIAL HISTORY:   Social History   Tobacco Use  . Smoking status: Never Smoker  . Smokeless tobacco: Never Used  . Tobacco comment: pt states she has never smoked  Substance Use Topics  . Alcohol use: Never    Frequency: Never     FAMILY HISTORY:   Family History  Problem Relation Age of Onset  . Hypertension Mother      DRUG ALLERGIES:  No Known Allergies  MEDICATIONS AT HOME:   Prior to Admission medications   Medication Sig Start Date End Date Taking? Authorizing Provider  albuterol (PROVENTIL) (2.5 MG/3ML) 0.083% nebulizer solution Inhale 2.5 mLs into the lungs every 4 (four) hours as  needed for wheezing or shortness of breath.  09/13/17  Yes [provider]  amLODipine (NORVASC) 10 MG tablet Take 10 mg by mouth daily. 05/04/17  Yes [provider]  atorvastatin (LIPITOR) 40 MG tablet Take 40 mg by mouth daily. 04/18/17  Yes [provider]  carvedilol (COREG) 25 MG tablet Take 25 mg by mouth 2 (two) times daily. 04/19/17  Yes [provider]  FLOVENT HFA 220 MCG/ACT inhaler Inhale 2 puffs into the lungs daily. 01/17/18  Yes Merwyn KatosSimonds, Miyoshi Ligas B, MD  furosemide (LASIX) 20 MG tablet Take 20 mg by mouth daily. 04/29/17  Yes [provider]  hydrALAZINE (APRESOLINE) 50 MG tablet Take 50 mg by mouth 3 (three) times daily. 07/05/17  Yes [provider]  levothyroxine (SYNTHROID, LEVOTHROID) 100 MCG tablet Take 100 mcg by mouth daily. 04/29/17  Yes [provider]  lisinopril (PRINIVIL,ZESTRIL) 40 MG tablet Take 40 mg by mouth daily. 06/28/17  Yes [provider]  montelukast (SINGULAIR) 10 MG tablet Take 10 mg by mouth daily. 07/19/17  Yes [provider]  albuterol (PROVENTIL HFA;VENTOLIN HFA) 108 (90 Base) MCG/ACT inhaler Inhale 2 puffs into the lungs every 6 (six) hours as needed for wheezing or shortness of breath. 10/11/17   Merwyn KatosSimonds, Hanalei Glace B, MD    REVIEW  OF SYSTEMS:  Review of Systems  Constitutional: Negative for chills, fever, malaise/fatigue and weight loss.  HENT: Negative for ear pain, hearing loss and tinnitus.   Eyes: Negative for blurred vision, double vision, pain and redness.  Respiratory: Positive for shortness of breath. Negative for cough and hemoptysis.   Cardiovascular: Positive for chest pain. Negative for palpitations, orthopnea and leg swelling.  Gastrointestinal: Negative for abdominal pain, constipation, diarrhea, nausea and vomiting.  Genitourinary: Negative for dysuria, frequency and hematuria.  Musculoskeletal: Negative for back pain, joint pain and neck pain.  Skin:       No acne, rash,  or lesions  Neurological: Negative for dizziness, tremors, focal weakness and weakness.  Endo/Heme/Allergies: Negative for polydipsia. Does not bruise/bleed easily.  Psychiatric/Behavioral: Negative for depression. The patient is not nervous/anxious and does not have insomnia.      VITAL SIGNS:   Vitals:   12/31/18 2051 12/31/18 2100 12/31/18 2130 12/31/18 2145  BP: (!) 157/82 136/89 125/71 130/63  Pulse: (!) 123 100    Resp: 18 18 (!) 21 18  Temp:      TempSrc:      SpO2: 95% 94%    Weight:      Height:       Wt Readings from Last 3 Encounters:  12/31/18 80.1 kg  01/17/18 80 kg  10/11/17 78.9 kg    PHYSICAL EXAMINATION:  Physical Exam  Vitals reviewed. Constitutional: She is oriented to person, place, and time. She appears well-developed and well-nourished. No distress.  HENT:  Head: Normocephalic and atraumatic.  Mouth/Throat: Oropharynx is clear and moist.  Eyes: Pupils are equal, round, and reactive to light. Conjunctivae and EOM are normal. No scleral icterus.  Neck: Normal range of motion. Neck supple. No JVD present. No thyromegaly present.  Cardiovascular: Intact distal pulses. Exam reveals no gallop and no friction rub.  No murmur heard. Tachycardic, irregular rhythm  Respiratory: Effort normal and breath sounds normal. No respiratory distress. She has no wheezes. She has no rales.  GI: Soft. Bowel sounds are normal. She exhibits no distension. There is no abdominal tenderness.  Musculoskeletal: Normal range of motion.        General: No edema.     Comments: No arthritis, no gout  Lymphadenopathy:    She has no cervical adenopathy.  Neurological: She is alert and oriented to person, place, and time. No cranial nerve deficit.  No dysarthria, no aphasia  Skin: Skin is warm and dry. No rash noted. No erythema.  Psychiatric: She has a normal mood and affect. Her behavior is normal. Judgment and thought content normal.    LABORATORY PANEL:   CBC Recent Labs   Lab 12/31/18 2046  WBC 8.2  HGB 13.4  HCT 41.6  PLT 243   ------------------------------------------------------------------------------------------------------------------  Chemistries  Recent Labs  Lab 12/31/18 2046  NA 142  K 3.9  CL 106  CO2 27  GLUCOSE 192*  BUN 26*  CREATININE 1.71*  CALCIUM 8.7*  MG 2.2   ------------------------------------------------------------------------------------------------------------------  Cardiac Enzymes No results for input(s): TROPONINI in the last 168 hours. ------------------------------------------------------------------------------------------------------------------  RADIOLOGY:  Dg Chest Portable 1 View  Result Date: 12/31/2018 CLINICAL DATA:  Chest pain EXAM: PORTABLE CHEST 1 VIEW COMPARISON:  September 15, 2017 FINDINGS: There is cardiomegaly. Pulmonary vascular congestion and mildly increased interstitial markings seen throughout both lungs. No pleural effusion. There is hyperinflation of the lung zones. No acute osseous abnormality. IMPRESSION: Cardiomegaly with pulmonary vascular congestion/interstitial edema. Electronically Signed   By: Kandis Fantasia  Avutu M.D.   On: 12/31/2018 21:18    EKG:   Orders placed or performed during the hospital encounter of 12/31/18  . EKG 12-Lead  . EKG 12-Lead    IMPRESSION AND PLAN:  Principal Problem:   Atrial fibrillation with RVR (HCC) -patient achieved relatively decent rate control with a few doses of IV push Lopressor, followed by p.o. Lopressor.  Will admit her to telemetry, with cardiac monitoring, with echocardiogram and cardiology consult in the morning.  We have started on a heparin drip Active Problems:   HTN (hypertension) -home dose antihypertensives   HLD (hyperlipidemia) -home dose antilipid   Hypothyroidism -home dose thyroid replacement  Chart review performed and case discussed with ED provider. Labs, imaging and/or ECG reviewed by provider and discussed with  patient/family. Management plans discussed with the patient and/or family.  COVID-19 status: Pending  DVT PROPHYLAXIS: Systemic anticoagulation  GI PROPHYLAXIS:  None  ADMISSION STATUS: Observation  CODE STATUS: Full Code Status History    Date Active Date Inactive Code Status Order ID Comments User Context   09/15/2017 1115 09/17/2017 1724 Full Code [id number]  Dustin Flock, MD Inpatient   07/12/2017 1344 07/13/2017 1900 Full Code [id number]  Fritzi Mandes, MD Inpatient   Advance Care Planning Activity    Advance Directive Documentation     Most Recent Value  Type of Advance Directive  Healthcare Power of [place], Living will  Pre-existing out of facility DNR order (yellow form or pink MOST form)  -  "MOST" Form in Place?  -      TOTAL TIME TAKING CARE OF THIS PATIENT: 40 minutes.   This patient was evaluated in the context of the global COVID-19 pandemic, which necessitated consideration that the patient might be at risk for infection with the SARS-CoV-2 virus that causes COVID-19. Institutional protocols and algorithms that pertain to the evaluation of patients at risk for COVID-19 are in a state of rapid change based on information released by regulatory bodies including the CDC and federal and state organizations. These policies and algorithms were followed to the best of this provider's knowledge to date during the patient's care at this facility.  Ethlyn Daniels 12/31/2018, 10:55 PM  [facility]  Office  [phone]  CC: Primary care physician; Marguerita Merles, MD  Note:  This document was prepared using Dragon voice recognition software and may include unintentional dictation errors.

## 2018-12-31 NOTE — Consult Note (Signed)
ANTICOAGULATION CONSULT NOTE   Pharmacy Consult for Heparin dosing  Indication: atrial fibrillation  No Known Allergies  Patient Measurements: Height: 5\' 5"  (165.1 cm) Weight: 176 lb 8 oz (80.1 kg) IBW/kg (Calculated) : 57 Heparin Dosing Weight: 73.9 kg   Vital Signs: Temp: 98.2 F (36.8 C) (09/20 2043) Temp Source: Oral (09/20 2043) BP: 170/71 (09/20 2043) Pulse Rate: 129 (09/20 2043)  Labs: Recent Labs    12/31/18 2046  HGB 13.4  HCT 41.6  PLT 243  CREATININE 1.71*  TROPONINIHS 11    Estimated Creatinine Clearance: 24.7 mL/min (A) (by C-G formula based on SCr of 1.71 mg/dL (H)).   Medications:  No PTA anticoagulants confirmed with patient.   Assessment: Pharmacy consulted for heparin dosing for atrial fibrillation in a patient who presented with CP that radiated to left arm.    Goal of Therapy:  Heparin level 0.3-0.7 units/ml Monitor platelets by anticoagulation protocol: Yes   Plan:  Baseline labs have been ordered  Heparin DW: 73.9 kg  Give 3650 units bolus x 1 Start heparin infusion at 1000 units/hr Check anti-Xa level in 8 hours and daily while on heparin, per protocol. Continue to monitor H&H and platelets   Joan Willis 12/31/2018,9:49 PM

## 2018-12-31 NOTE — ED Triage Notes (Signed)
Reports chest pain that radiates into left arm for couple hours.  Patient denies any shortness of breath or sweatiness.

## 2018-12-31 NOTE — ED Provider Notes (Addendum)
[facility] Emergency Department Provider Note   ____________________________________________   First MD Initiated Contact with Patient 12/31/18 2039     (approximate)  I have reviewed the triage vital signs and the nursing notes.   HISTORY  Chief Complaint Chest Pain    HPI Joan Willis is a 85 y.o. female with past medical history of hypertension, asthma, diastolic CHF who presents to the ED complaining of chest pain.  Patient reports a couple hours ago she had acute onset of feeling of tightness in her chest along with difficulty catching her breath.  She thought it might be her asthma and took a couple puffs off of her inhaler with no improvement.  Symptoms have been constant since then and not exacerbated or alleviated by anything.  She was sitting still with onset of symptoms.  She denies any palpitations, has not had any nausea or vomiting.  She states she was feeling well earlier in the day with no recent fevers, chills, cough, dysuria, or hematuria.        Past Medical History:  Diagnosis Date  . Asthma   . Hyperlipemia   . Hypertension   . Hypothyroid     Patient Active Problem List   Diagnosis Date Noted  . Atrial fibrillation with RVR (HCC) 12/31/2018  . HTN (hypertension) 12/31/2018  . HLD (hyperlipidemia) 12/31/2018  . Hypothyroidism 12/31/2018  . Asthma flare 09/15/2017  . Bronchitis 07/12/2017    Past Surgical History:  Procedure Laterality Date  . ABDOMINAL HYSTERECTOMY    . APPENDECTOMY    . arm surgery    . cartotid endaractomy      Prior to Admission medications   Medication Sig Start Date End Date Taking? Authorizing Provider  albuterol (PROVENTIL HFA;VENTOLIN HFA) 108 (90 Base) MCG/ACT inhaler Inhale 2 puffs into the lungs every 6 (six) hours as needed for wheezing or shortness of breath. 10/11/17  Yes Merwyn Katos, MD  albuterol (PROVENTIL) (2.5 MG/3ML) 0.083% nebulizer solution Inhale 2.5 mLs into the lungs  every 4 (four) hours as needed for wheezing or shortness of breath.  09/13/17  Yes [provider]  amLODipine (NORVASC) 10 MG tablet Take 10 mg by mouth daily. 05/04/17  Yes [provider]  atorvastatin (LIPITOR) 40 MG tablet Take 40 mg by mouth daily. 04/18/17  Yes [provider]  carvedilol (COREG) 25 MG tablet Take 25 mg by mouth 2 (two) times daily. 04/19/17  Yes [provider]  FLOVENT HFA 220 MCG/ACT inhaler Inhale 2 puffs into the lungs daily. 01/17/18  Yes Merwyn Katos, MD  furosemide (LASIX) 20 MG tablet Take 20 mg by mouth daily. 04/29/17  Yes [provider]  hydrALAZINE (APRESOLINE) 50 MG tablet Take 50 mg by mouth 3 (three) times daily. 07/05/17  Yes [provider]  levothyroxine (SYNTHROID, LEVOTHROID) 100 MCG tablet Take 100 mcg by mouth daily. 04/29/17  Yes [provider]  lisinopril (PRINIVIL,ZESTRIL) 40 MG tablet Take 40 mg by mouth daily. 06/28/17  Yes [provider]  montelukast (SINGULAIR) 10 MG tablet Take 10 mg by mouth daily. 07/19/17  Yes [provider]    Allergies Patient has no known allergies.  Family History  Problem Relation Age of Onset  . Hypertension Mother     Social History Social History   Tobacco Use  . Smoking status: Never Smoker  . Smokeless tobacco: Never Used  . Tobacco comment: pt states she has never smoked  Substance Use Topics  .  Alcohol use: Never    Frequency: Never  . Drug use: Never    Review of Systems  Constitutional: No fever/chills Eyes: No visual changes. ENT: No sore throat. Cardiovascular: Positive for chest pain. Respiratory: Positive for shortness of breath. Gastrointestinal: No abdominal pain.  No nausea, no vomiting.  No diarrhea.  No constipation. Genitourinary: Negative for dysuria. Musculoskeletal: Negative for back pain. Skin: Negative for rash. Neurological: Negative for headaches, focal weakness or numbness.   ____________________________________________   PHYSICAL EXAM:  VITAL SIGNS: ED Triage Vitals  Enc Vitals Group     BP --      Pulse --      Resp --      Temp --      Temp src --      SpO2 --      Weight 12/31/18 2039 176 lb 8 oz (80.1 kg)     Height 12/31/18 2039 5\' 5"  (1.651 m)     Head Circumference --      Peak Flow --      Pain Score 12/31/18 2038 6     Pain Loc --      Pain Edu? --      Excl. in GC? --     Constitutional: Alert and oriented. Eyes: Conjunctivae are normal. Head: Atraumatic. Nose: No congestion/rhinnorhea. Mouth/Throat: Mucous membranes are moist. Neck: Normal ROM Cardiovascular: Tachycardic, irregularly irregular. Grossly normal heart sounds. Respiratory: Normal respiratory effort.  No retractions. Lungs CTAB. Gastrointestinal: Soft and nontender. No distention. Genitourinary: deferred Musculoskeletal: No lower extremity tenderness nor edema. Neurologic:  Normal speech and language. No gross focal neurologic deficits are appreciated. Skin:  Skin is warm, dry and intact. No rash noted. Psychiatric: Mood and affect are normal. Speech and behavior are normal.  ____________________________________________   LABS (all labs ordered are listed, but only abnormal results are displayed)  Labs Reviewed  BASIC METABOLIC PANEL - Abnormal; Notable for the following components:      Result Value   Glucose, Bld 192 (*)    BUN 26 (*)    Creatinine, Ser 1.71 (*)    Calcium 8.7 (*)    GFR calc non Af Amer 27 (*)    GFR calc Af Amer 31 (*)    All other components within normal limits  BRAIN NATRIURETIC PEPTIDE - Abnormal; Notable for the following components:   B Natriuretic Peptide 357.0 (*)    All other components within normal limits  URINALYSIS, COMPLETE (UACMP) WITH MICROSCOPIC - Abnormal; Notable for the following components:   Color, Urine STRAW (*)    APPearance HAZY (*)    Protein, ur 30 (*)    All other components within normal limits  SARS  CORONAVIRUS 2 (TAT 6-24 HRS)  CBC WITH DIFFERENTIAL/PLATELET  MAGNESIUM  TSH  APTT  PROTIME-INR  CBC  HEPARIN LEVEL (UNFRACTIONATED)  BASIC METABOLIC PANEL  TROPONIN I (HIGH SENSITIVITY)  TROPONIN I (HIGH SENSITIVITY)   ____________________________________________  EKG  ED ECG REPORT I, Chesley Noonharles Alayna Mabe, the attending physician, personally viewed and interpreted this ECG.   Date: 12/31/2018  EKG Time: 40:40  Rate: 123  Rhythm: atrial fibrillation, rate 123  Axis: LAD  Intervals:none  ST&T Change: ST depressions laterally    PROCEDURES  Procedure(s) performed (including Critical Care):  .Critical Care Performed by: Chesley NoonJessup, Myrical Andujo, MD Authorized by: Chesley NoonJessup, Mayrene Bastarache, MD   Critical care provider statement:    Critical care time (minutes):  45   Critical care time was exclusive of:  Separately billable procedures  and treating other patients and teaching time   Critical care was necessary to treat or prevent imminent or life-threatening deterioration of the following conditions:  Circulatory failure   Critical care was time spent personally by me on the following activities:  Discussions with consultants, evaluation of patient's response to treatment, examination of patient, ordering and performing treatments and interventions, ordering and review of laboratory studies, ordering and review of radiographic studies, pulse oximetry, re-evaluation of patient's condition, obtaining history from patient or surrogate and review of old charts   I assumed direction of critical care for this patient from another provider in my specialty: no       ____________________________________________   INITIAL IMPRESSION / ASSESSMENT AND PLAN / ED COURSE       85 year old female presents to the ED with approximately 2 hours of chest tightness and difficulty catching her breath.  She is in no respiratory distress on initial evaluation, maintaining O2 sats on room air.  She does appear to be  in atrial fibrillation with rapid ventricular response, which is new for her.  Given her history of CHF, will attempt to control rate with metoprolol.  She does have some apparent ST depressions on EKG, no ST elevation.  This may be rate related, will check troponin.  Patient's rate now improved following 2 IV doses of metoprolol will give p.o. dose of metoprolol as well.  Troponin within normal limits, suspect her chest pain is related to atrial fibrillation rather than ACS.  Case discussed with hospitalist, who accepts patient for admission.  Given new onset atrial fibrillation, will start patient on heparin drip until she is able to be transitioned to oral anticoagulation.      ____________________________________________   FINAL CLINICAL IMPRESSION(S) / ED DIAGNOSES  Final diagnoses:  Atrial fibrillation, unspecified type ([place])  Chest pain, unspecified type  Shortness of breath     ED Discharge Orders    None       Note:  This document was prepared using Dragon voice recognition software and may include unintentional dictation errors.   Blake Divine, MD 01/01/19 Lynnell Catalan    Blake Divine, MD 01/01/19 [phone]

## 2019-01-01 ENCOUNTER — Telehealth: Payer: Self-pay | Admitting: Cardiovascular Disease

## 2019-01-01 ENCOUNTER — Observation Stay (HOSPITAL_BASED_OUTPATIENT_CLINIC_OR_DEPARTMENT_OTHER)
Admit: 2019-01-01 | Discharge: 2019-01-01 | Disposition: A | Discharge status: Home / Self Care | Payer: Medicare Other | Attending: Internal Medicine | Admitting: Internal Medicine

## 2019-01-01 ENCOUNTER — Observation Stay (HOSPITAL_BASED_OUTPATIENT_CLINIC_OR_DEPARTMENT_OTHER): Payer: Medicare Other

## 2019-01-01 DIAGNOSIS — I4891 Unspecified atrial fibrillation: Secondary | ICD-10-CM | POA: Diagnosis not present

## 2019-01-01 DIAGNOSIS — R0789 Other chest pain: Secondary | ICD-10-CM

## 2019-01-01 DIAGNOSIS — E876 Hypokalemia: Secondary | ICD-10-CM

## 2019-01-01 DIAGNOSIS — I361 Nonrheumatic tricuspid (valve) insufficiency: Secondary | ICD-10-CM | POA: Diagnosis not present

## 2019-01-01 DIAGNOSIS — I10 Essential (primary) hypertension: Secondary | ICD-10-CM | POA: Diagnosis not present

## 2019-01-01 DIAGNOSIS — R7989 Other specified abnormal findings of blood chemistry: Secondary | ICD-10-CM | POA: Diagnosis not present

## 2019-01-01 DIAGNOSIS — R0602 Shortness of breath: Secondary | ICD-10-CM | POA: Diagnosis not present

## 2019-01-01 DIAGNOSIS — I340 Nonrheumatic mitral (valve) insufficiency: Secondary | ICD-10-CM

## 2019-01-01 DIAGNOSIS — I5030 Unspecified diastolic (congestive) heart failure: Secondary | ICD-10-CM

## 2019-01-01 LAB — CBC
HCT: 39 % (ref 36.0–46.0)
Hemoglobin: 12.6 g/dL (ref 12.0–15.0)
MCH: 28.6 pg (ref 26.0–34.0)
MCHC: 32.3 g/dL (ref 30.0–36.0)
MCV: 88.6 fL (ref 80.0–100.0)
Platelets: 220 10*3/uL (ref 150–400)
RBC: 4.4 MIL/uL (ref 3.87–5.11)
RDW: 14.6 % (ref 11.5–15.5)
WBC: 8.3 10*3/uL (ref 4.0–10.5)
nRBC: 0 % (ref 0.0–0.2)

## 2019-01-01 LAB — BASIC METABOLIC PANEL
Anion gap: 7 (ref 5–15)
BUN: 20 mg/dL (ref 8–23)
CO2: 25 mmol/L (ref 22–32)
Calcium: 8.4 mg/dL — ABNORMAL LOW (ref 8.9–10.3)
Chloride: 110 mmol/L (ref 98–111)
Creatinine, Ser: 1.17 mg/dL — ABNORMAL HIGH (ref 0.44–1.00)
GFR calc Af Amer: 49 mL/min — ABNORMAL LOW (ref >60)
GFR calc non Af Amer: 42 mL/min — ABNORMAL LOW (ref >60)
Glucose, Bld: 103 mg/dL — ABNORMAL HIGH (ref 70–99)
Potassium: 3.4 mmol/L — ABNORMAL LOW (ref 3.5–5.1)
Sodium: 142 mmol/L (ref 135–145)

## 2019-01-01 LAB — NM MYOCAR MULTI W/SPECT W/WALL MOTION / EF
Estimated workload: 1 METS
Exercise duration (min): 0 min
Exercise duration (sec): 0 s
LV dias vol: 64 mL (ref 46–106)
LV sys vol: 24 mL
MPHR: 134 {beats}/min
Peak HR: 134 {beats}/min
Percent HR: 61 %
Rest HR: 59 {beats}/min
SDS: 1
SRS: 4
SSS: 8
TID: 0.89

## 2019-01-01 LAB — TROPONIN I (HIGH SENSITIVITY): Troponin I (High Sensitivity): 163 ng/L (ref <18)

## 2019-01-01 LAB — HEMOGLOBIN A1C
Hgb A1c MFr Bld: 5.6 % (ref 4.8–5.6)
Mean Plasma Glucose: 114.02 mg/dL

## 2019-01-01 LAB — ECHOCARDIOGRAM COMPLETE
Height: 65 in
Weight: 2824 oz

## 2019-01-01 LAB — HEPARIN LEVEL (UNFRACTIONATED): Heparin Unfractionated: 0.68 IU/mL (ref 0.30–0.70)

## 2019-01-01 LAB — SARS CORONAVIRUS 2 (TAT 6-24 HRS): SARS Coronavirus 2: NEGATIVE

## 2019-01-01 MED ORDER — FUROSEMIDE 20 MG PO TABS
20.0000 mg | ORAL_TABLET | Freq: Every day | ORAL | Status: DC
  Administered 2019-01-01: 20 mg via ORAL
  Filled 2019-01-01: qty 1

## 2019-01-01 MED ORDER — HYDRALAZINE HCL 50 MG PO TABS
50.0000 mg | ORAL_TABLET | Freq: Three times a day (TID) | ORAL | Status: DC
  Administered 2019-01-01: 50 mg via ORAL
  Filled 2019-01-01: qty 1

## 2019-01-01 MED ORDER — LISINOPRIL 20 MG PO TABS
40.0000 mg | ORAL_TABLET | Freq: Every day | ORAL | Status: DC
  Administered 2019-01-01: 40 mg via ORAL
  Filled 2019-01-01: qty 2

## 2019-01-01 MED ORDER — ONDANSETRON HCL 4 MG PO TABS: 4.0000 mg | ORAL_TABLET | Freq: Four times a day (QID) | ORAL | Status: DC | PRN

## 2019-01-01 MED ORDER — INFLUENZA VAC A&B SA ADJ QUAD 0.5 ML IM PRSY
0.5000 mL | PREFILLED_SYRINGE | INTRAMUSCULAR | Status: AC
  Administered 2019-01-01: 0.5 mL via INTRAMUSCULAR
  Filled 2019-01-01 (×2): qty 0.5

## 2019-01-01 MED ORDER — REGADENOSON 0.4 MG/5ML IV SOLN
0.4000 mg | Freq: Once | INTRAVENOUS | Status: AC
  Administered 2019-01-01: 0.4 mg via INTRAVENOUS

## 2019-01-01 MED ORDER — LISINOPRIL 20 MG PO TABS: 40.0000 mg | ORAL_TABLET | Freq: Every day | ORAL | Status: DC

## 2019-01-01 MED ORDER — ACETAMINOPHEN 650 MG RE SUPP: 650.0000 mg | Freq: Four times a day (QID) | RECTAL | Status: DC | PRN

## 2019-01-01 MED ORDER — BUDESONIDE 0.5 MG/2ML IN SUSP
0.5000 mg | Freq: Two times a day (BID) | RESPIRATORY_TRACT | Status: DC
  Administered 2019-01-01: 0.5 mg via RESPIRATORY_TRACT

## 2019-01-01 MED ORDER — APIXABAN 5 MG PO TABS: 5.0000 mg | ORAL_TABLET | Freq: Two times a day (BID) | ORAL | 2 refills | Status: DC

## 2019-01-01 MED ORDER — APIXABAN 5 MG PO TABS
5.0000 mg | ORAL_TABLET | Freq: Two times a day (BID) | ORAL | Status: DC
  Administered 2019-01-01: 5 mg via ORAL
  Filled 2019-01-01 (×2): qty 1

## 2019-01-01 MED ORDER — FLUTICASONE PROPIONATE HFA 220 MCG/ACT IN AERO: 2.0000 | INHALATION_SPRAY | Freq: Every day | RESPIRATORY_TRACT | Status: DC

## 2019-01-01 MED ORDER — CARVEDILOL 12.5 MG PO TABS: 12.5000 mg | ORAL_TABLET | Freq: Two times a day (BID) | ORAL | 2 refills | Status: DC

## 2019-01-01 MED ORDER — MONTELUKAST SODIUM 10 MG PO TABS
10.0000 mg | ORAL_TABLET | Freq: Every day | ORAL | Status: DC
  Administered 2019-01-01: 10 mg via ORAL
  Filled 2019-01-01 (×2): qty 1

## 2019-01-01 MED ORDER — METOPROLOL TARTRATE 5 MG/5ML IV SOLN: 5.0000 mg | Freq: Four times a day (QID) | INTRAVENOUS | Status: DC | PRN

## 2019-01-01 MED ORDER — ATORVASTATIN CALCIUM 20 MG PO TABS
40.0000 mg | ORAL_TABLET | Freq: Every day | ORAL | Status: DC
  Administered 2019-01-01: 40 mg via ORAL
  Filled 2019-01-01 (×2): qty 2

## 2019-01-01 MED ORDER — TECHNETIUM TC 99M TETROFOSMIN IV KIT
32.6200 | PACK | Freq: Once | INTRAVENOUS | Status: AC | PRN
  Administered 2019-01-01: 32.62 via INTRAVENOUS

## 2019-01-01 MED ORDER — LEVOTHYROXINE SODIUM 100 MCG PO TABS
100.0000 ug | ORAL_TABLET | Freq: Every day | ORAL | Status: DC
  Administered 2019-01-01: 100 ug via ORAL
  Filled 2019-01-01: qty 1

## 2019-01-01 MED ORDER — INFLUENZA VAC A&B SA ADJ QUAD 0.5 ML IM PRSY: 0.5000 mL | PREFILLED_SYRINGE | INTRAMUSCULAR | Status: DC

## 2019-01-01 MED ORDER — ACETAMINOPHEN 325 MG PO TABS: 650.0000 mg | ORAL_TABLET | Freq: Four times a day (QID) | ORAL | Status: DC | PRN

## 2019-01-01 MED ORDER — CARVEDILOL 12.5 MG PO TABS: 12.5000 mg | ORAL_TABLET | Freq: Two times a day (BID) | ORAL | Status: DC

## 2019-01-01 MED ORDER — AMLODIPINE BESYLATE 10 MG PO TABS
10.0000 mg | ORAL_TABLET | Freq: Every day | ORAL | Status: DC
  Administered 2019-01-01: 10 mg via ORAL
  Filled 2019-01-01: qty 1

## 2019-01-01 MED ORDER — POTASSIUM CHLORIDE CRYS ER 20 MEQ PO TBCR
40.0000 meq | EXTENDED_RELEASE_TABLET | Freq: Once | ORAL | Status: AC
  Administered 2019-01-01: 40 meq via ORAL
  Filled 2019-01-01: qty 2

## 2019-01-01 MED ORDER — ONDANSETRON HCL 4 MG/2ML IJ SOLN: 4.0000 mg | Freq: Four times a day (QID) | INTRAMUSCULAR | Status: DC | PRN

## 2019-01-01 MED ORDER — TECHNETIUM TC 99M TETROFOSMIN IV KIT
10.0000 | PACK | Freq: Once | INTRAVENOUS | Status: AC | PRN
  Administered 2019-01-01: 10.26 via INTRAVENOUS

## 2019-01-01 MED ORDER — CARVEDILOL 25 MG PO TABS
25.0000 mg | ORAL_TABLET | Freq: Two times a day (BID) | ORAL | Status: DC
  Filled 2019-01-01: qty 1

## 2019-01-01 NOTE — Consult Note (Signed)
ANTICOAGULATION CONSULT NOTE   Pharmacy Consult for Heparin dosing  Indication: atrial fibrillation  No Known Allergies  Patient Measurements: Height: 5\' 5"  (165.1 cm) Weight: 176 lb 8 oz (80.1 kg) IBW/kg (Calculated) : 57 Heparin Dosing Weight: 73.9 kg   Vital Signs: Temp: 97.7 F (36.5 C) (09/21 0357) Temp Source: Oral (09/21 0357) BP: 156/70 (09/21 0357) Pulse Rate: 58 (09/21 0357)  Labs: Recent Labs    12/31/18 2046 01/01/19 0520  HGB 13.4 12.6  HCT 41.6 39.0  PLT 243 220  APTT 29  --   LABPROT 13.0  --   INR 1.0  --   HEPARINUNFRC  --  0.68  CREATININE 1.71* 1.17*  TROPONINIHS 11  --     Estimated Creatinine Clearance: 36.1 mL/min (A) (by C-G formula based on SCr of 1.17 mg/dL (H)).   Medications:  No PTA anticoagulants confirmed with patient.   Assessment: Pharmacy consulted for heparin dosing for atrial fibrillation in a patient who presented with CP that radiated to left arm.   9/21 @ 0520 HL = 0.68, therapeutic x 1 (drawn 70 minutes early)  Goal of Therapy:  Heparin level 0.3-0.7 units/ml Monitor platelets by anticoagulation protocol: Yes   Plan:  Baseline labs have been ordered  Heparin DW: 73.9 kg  Continue heparin infusion at 1000 units/hr Check anti-Xa level in 8 hours to confirm and daily while on heparin, per protocol. Continue to monitor H&H and platelets   [place] Crane, Jodi Criscuolo A 01/01/2019,6:30 AM

## 2019-01-01 NOTE — TOC Initial Note (Signed)
Transition of Care [facility]) - Initial/Assessment Note    Patient Details  Name: Joan Willis MRN: [id number] Date of Birth: [birth year]  Transition of Care [facility]) CM/SW Contact:    Elza Rafter, RN Phone Number: 01/01/2019, 2:58 PM  Clinical Narrative:                 Independent in all adls, denies issues accessing medical care, obtaining medications or with transportation.  Current with PCP.  30 day free Eliquis coupon given-pt. States she has prescription coverage.  No discharge needs identified at present by care manager or members of care team          Patient Goals and CMS Choice        Expected Discharge Plan and Services                                                Prior Living Arrangements/Services                       Activities of Daily Living Home Assistive Devices/Equipment: None ADL Screening (condition at time of admission) Patient's cognitive ability adequate to safely complete daily activities?: Yes Is the patient deaf or have difficulty hearing?: Yes Does the patient have difficulty seeing, even when wearing glasses/contacts?: No Does the patient have difficulty concentrating, remembering, or making decisions?: No Patient able to express need for assistance with ADLs?: Yes Does the patient have difficulty dressing or bathing?: No Independently performs ADLs?: Yes (appropriate for developmental age) Does the patient have difficulty walking or climbing stairs?: Yes Weakness of Legs: None Weakness of Arms/Hands: None  Permission Sought/Granted                  Emotional Assessment              Admission diagnosis:  Shortness of breath [R06.02] Atrial fibrillation, unspecified type ([place]) [I48.91] Chest pain, unspecified type [R07.9] Patient Active Problem List   Diagnosis Date Noted  . Atrial fibrillation with RVR ([place]) 12/31/2018  . HTN (hypertension) 12/31/2018  . HLD (hyperlipidemia) 12/31/2018  .  Hypothyroidism 12/31/2018  . Asthma flare 09/15/2017  . Bronchitis 07/12/2017   PCP:  Marguerita Merles, MD Pharmacy:   [id number] - [place], [place] - [address] [place] [address] Phone: [phone] Fax: [phone]  [id number] - [place], [place] [address] [place] [address] Phone: [phone] Fax: [phone]     Social Determinants of Health (SDOH) Interventions    Readmission Risk Interventions No flowsheet data found.

## 2019-01-01 NOTE — Consult Note (Signed)
[place] for apixaban Indication: atrial fibrillation  No Known Allergies  Patient Measurements: Height: 5\' 5"  (165.1 cm) Weight: 176 lb 8 oz (80.1 kg) IBW/kg (Calculated) : 57  Vital Signs: Temp: 97.6 F (36.4 C) (09/21 0746) Temp Source: Oral (09/21 0746) BP: 165/64 (09/21 0746) Pulse Rate: 57 (09/21 0746)  Labs: Recent Labs    12/31/18 2046 01/01/19 0520  HGB 13.4 12.6  HCT 41.6 39.0  PLT 243 220  APTT 29  --   LABPROT 13.0  --   INR 1.0  --   HEPARINUNFRC  --  0.68  CREATININE 1.71* 1.17*  TROPONINIHS 11 163*    Estimated Creatinine Clearance: 36.1 mL/min (A) (by C-G formula based on SCr of 1.17 mg/dL (H)).   Medical History: Past Medical History:  Diagnosis Date  . Asthma   . Hyperlipemia   . Hypertension   . Hypothyroid     Medications:  Medications Prior to Admission  Medication Sig Dispense Refill Last Dose  . albuterol (PROVENTIL HFA;VENTOLIN HFA) 108 (90 Base) MCG/ACT inhaler Inhale 2 puffs into the lungs every 6 (six) hours as needed for wheezing or shortness of breath. 1 Inhaler 3 prn at prn  . albuterol (PROVENTIL) (2.5 MG/3ML) 0.083% nebulizer solution Inhale 2.5 mLs into the lungs every 4 (four) hours as needed for wheezing or shortness of breath.   3 prn at prn  . amLODipine (NORVASC) 10 MG tablet Take 10 mg by mouth daily.  4 12/31/2018 at Unknown time  . atorvastatin (LIPITOR) 40 MG tablet Take 40 mg by mouth daily.  3 12/31/2018 at Unknown time  . carvedilol (COREG) 25 MG tablet Take 25 mg by mouth 2 (two) times daily.  4 12/31/2018 at 1800  . FLOVENT HFA 220 MCG/ACT inhaler Inhale 2 puffs into the lungs daily. 1 Inhaler 10 12/31/2018 at Unknown time  . furosemide (LASIX) 20 MG tablet Take 20 mg by mouth daily.  4 12/31/2018 at Unknown time  . hydrALAZINE (APRESOLINE) 50 MG tablet Take 50 mg by mouth 3 (three) times daily.  2 12/31/2018 at 1800  . levothyroxine (SYNTHROID, LEVOTHROID) 100 MCG tablet Take 100  mcg by mouth daily.  4 12/31/2018 at Unknown time  . lisinopril (PRINIVIL,ZESTRIL) 40 MG tablet Take 40 mg by mouth daily.  1 12/31/2018 at Unknown time  . montelukast (SINGULAIR) 10 MG tablet Take 10 mg by mouth daily.  4 12/31/2018 at Unknown time   Scheduled:  . atorvastatin  40 mg Oral Daily  . budesonide (PULMICORT) nebulizer solution  0.5 mg Nebulization BID  . carvedilol  25 mg Oral BID  . [START ON 01/02/2019] influenza vaccine adjuvanted  0.5 mL Intramuscular Tomorrow-1000  . levothyroxine  100 mcg Oral Daily  . montelukast  10 mg Oral Daily  . potassium chloride  40 mEq Oral Once   Infusions:   PRN: acetaminophen **OR** acetaminophen, metoprolol tartrate, ondansetron **OR** ondansetron (ZOFRAN) IV Anti-infectives (From admission, onward)   None      Assessment: Pharmacy was consulted to start apixaban for afib. Pt was on heparin gtt - d/c'ed. CHADSVASc score of 3.   Goal of Therapy:  Monitor platelets by anticoagulation protocol: Yes   Plan:  Will start apixaban 5 mg BID. Pt does not qualify for a dose reduction at this point. If Scr > 1.5 to Wt < 60 kg - decrease dose to 2.5 mg BID.   Oswald Hillock, PharmD, BCPS 01/01/2019,8:50 AM

## 2019-01-01 NOTE — Consult Note (Signed)
Cardiology Consultation:   Patient ID: Joan Willis MRN: [id number]; DOB: [birth year]  Admit date: 12/31/2018 Date of Consult: 01/01/2019  Primary Care Provider: Leanna SatoMiles, Linda M, MD [facility], Dr. Kirke CorinArida Primary Electrophysiologist:  None    Patient Profile:   Joan Willis is a 85 y.o. female with a hx of hypertension, asthma, diastolic CHF, s/p carotid endaractomy and who is being seen today for the evaluation of chest pain in the setting of new onset atrial fibrillation at the request of Dr. Nemiah CommanderKalisetti.  History of Present Illness:   Joan Willis is an 85 yo female with PMH as above and no previously known history of heart disease. She does not drink and has no past history of smoking or illegal drug use. No significant family history of heart disease other than her mother than passed away from heart disease in her mid 870s.  On 12/31/2018, she reportedly had some vegetable soup and within 1 hour felt substernal chest pressure that radiated down the left arm and lasted approximately 2 hours and until the [facility] department and received 2 doses of IV metoprolol, resulting in rate control.  Associated symptoms included shortness of breath.  She denied any feelings of racing heart rate, palpitations, diaphoresis, nausea, emesis, presyncope, or syncope.  She reportedly initially thought that her pressure and SOB was 2/2 breathing and attempted to use her inhalers without relief. She then thought that her symptoms might be due to indigestion and attempted to have a sip of seltzer water without relief. She lives with her daughter and, per patient, her daughter suggested she [facility] when she was still experiencing chest pressure. In the emergency department, EKG significant for atrial fibrillation with rapid ventricular response. Vitals significant for BP elevated at 170/71 with rates initially elevated into the high 120s to low 130s. HS Tn mildly  elevated 11  163. As above, patient reportely felt relief with administration of IV metoprolol and further rate control. She converted to SR on her own early this AM and around 5:40AM.  Heart Pathway Score:     Past Medical History:  Diagnosis Date   Asthma    Hyperlipemia    Hypertension    Hypothyroid     Past Surgical History:  Procedure Laterality Date   ABDOMINAL HYSTERECTOMY     APPENDECTOMY     arm surgery     cartotid endaractomy       Home Medications:  Prior to Admission medications   Medication Sig Start Date End Date Taking? Authorizing Provider  albuterol (PROVENTIL HFA;VENTOLIN HFA) 108 (90 Base) MCG/ACT inhaler Inhale 2 puffs into the lungs every 6 (six) hours as needed for wheezing or shortness of breath. 10/11/17  Yes Merwyn KatosSimonds, David B, MD  albuterol (PROVENTIL) (2.5 MG/3ML) 0.083% nebulizer solution Inhale 2.5 mLs into the lungs every 4 (four) hours as needed for wheezing or shortness of breath.  09/13/17  Yes [provider]  amLODipine (NORVASC) 10 MG tablet Take 10 mg by mouth daily. 05/04/17  Yes [provider]  atorvastatin (LIPITOR) 40 MG tablet Take 40 mg by mouth daily. 04/18/17  Yes [provider]  carvedilol (COREG) 25 MG tablet Take 25 mg by mouth 2 (two) times daily. 04/19/17  Yes [provider]  FLOVENT HFA 220 MCG/ACT inhaler Inhale 2 puffs into the lungs daily. 01/17/18  Yes Merwyn KatosSimonds, David B, MD  furosemide (LASIX) 20 MG tablet Take 20 mg by mouth daily. 04/29/17  Yes [provider]  hydrALAZINE (APRESOLINE) 50 MG tablet Take 50 mg by mouth 3 (three) times daily. 07/05/17  Yes [provider]  levothyroxine (SYNTHROID, LEVOTHROID) 100 MCG tablet Take 100 mcg by mouth daily. 04/29/17  Yes [provider]  lisinopril (PRINIVIL,ZESTRIL) 40 MG tablet Take 40 mg by mouth daily. 06/28/17  Yes [provider]  montelukast (SINGULAIR) 10 MG tablet Take 10 mg by mouth daily. 07/19/17  Yes  [provider]    Inpatient Medications: Scheduled Meds:  atorvastatin  40 mg Oral Daily   budesonide (PULMICORT) nebulizer solution  0.5 mg Nebulization BID   carvedilol  25 mg Oral BID   [START ON 01/02/2019] influenza vaccine adjuvanted  0.5 mL Intramuscular Tomorrow-1000   levothyroxine  100 mcg Oral Daily   montelukast  10 mg Oral Daily   Continuous Infusions:  heparin 1,000 Units/hr (12/31/18 2215)   PRN Meds: acetaminophen **OR** acetaminophen, metoprolol tartrate, ondansetron **OR** ondansetron (ZOFRAN) IV  Allergies:   No Known Allergies  Social History:   Social History   Socioeconomic History   Marital status: Widowed    Spouse name: Not on file   Number of children: Not on file   Years of education: Not on file   Highest education level: Not on file  Occupational History   Not on file  Social Needs   Financial resource strain: Not on file   Food insecurity    Worry: Not on file    Inability: Not on file   Transportation needs    Medical: Not on file    Non-medical: Not on file  Tobacco Use   Smoking status: Never Smoker   Smokeless tobacco: Never Used   Tobacco comment: pt states she has never smoked  Substance and Sexual Activity   Alcohol use: Never    Frequency: Never   Drug use: Never   Sexual activity: Not on file  Lifestyle   Physical activity    Days per week: Not on file    Minutes per session: Not on file   Stress: Not on file  Relationships   Social connections    Talks on phone: Not on file    Gets together: Not on file    Attends religious service: Not on file    Active member of club or organization: Not on file    Attends meetings of clubs or organizations: Not on file    Relationship status: Not on file   Intimate partner violence    Fear of current or ex partner: Not on file    Emotionally abused: Not on file    Physically abused: Not on file    Forced sexual activity: Not on file  Other  Topics Concern   Not on file  Social History Narrative   Not on file    Family History:     Family History  Problem Relation Age of Onset   Hypertension Mother      ROS:  Please see the history of present illness.  Review of Systems  Constitutional: Negative for diaphoresis.  Respiratory: Positive for shortness of breath. Negative for cough.   Cardiovascular: Positive for chest pain. Negative for palpitations, orthopnea, claudication and leg swelling.  Gastrointestinal: Negative for abdominal pain, blood in stool, constipation, diarrhea, melena, nausea and vomiting.  Genitourinary: Negative for hematuria.  Musculoskeletal: Negative for falls.  Neurological: Negative for dizziness.    All other ROS reviewed and negative.     Physical Exam/Data:  Vitals:   12/31/18 2315 12/31/18 2359 01/01/19 0357 01/01/19 0746  BP: 130/74 (!) 141/75 (!) 156/70 (!) 165/64  Pulse:  (!) 48 (!) 58 (!) 57  Resp: 14 17 17    Temp:  98.4 F (36.9 C) 97.7 F (36.5 C) 97.6 F (36.4 C)  TempSrc:  Oral Oral Oral  SpO2:  97% 97% 96%  Weight:      Height:        Intake/Output Summary (Last 24 hours) at 01/01/2019 0811 Last data filed at 01/01/2019 0358 Gross per 24 hour  Intake 31.67 ml  Output 600 ml  Net -568.33 ml   Last 3 Weights 12/31/2018 01/17/2018 10/11/2017  Weight (lbs) 176 lb 8 oz 176 lb 6.4 oz 174 lb  Weight (kg) 80.06 kg 80.015 kg 78.926 kg     Body mass index is 29.37 kg/m.  General:  Well nourished, well developed, in no acute distress. Getting echo performed HEENT: normal Neck: no JVD Vascular: No carotid bruits; radial pulses 2+ bilaterally Cardiac:  normal S1, S2; RRR; no murmur  Lungs:  clear to auscultation bilaterally, no wheezing, rhonchi or rales  Abd: soft, nontender, no hepatomegaly  Ext: no edema Musculoskeletal:  No deformities, BUE and BLE strength normal and equal Skin: warm and dry  Neuro:  No focal abnormalities noted Psych:  Normal affect   EKG:  The  EKG was personally reviewed and demonstrates: Atrial fibrillation with rapid ventricular response, ventricular rate 123 bpm, left axis deviation, IVCD, no acute ST or T changes. Telemetry:  Telemetry was personally reviewed and demonstrates:  IRIR / Afib and converted around 5:40AM-6AM this morning. Now SR to SB with intermittent ectopy  Relevant CV Studies:  Pending updated echo  Echo 07/14/2018 - Left ventricle: The cavity size was at the upper limits of   normal. Wall thickness was increased in a pattern of moderate   LVH. Systolic function was normal. The estimated ejection   fraction was in the range of 60% to 65%. Features are consistent   with a pseudonormal left ventricular filling pattern, with   concomitant abnormal relaxation and increased filling pressure   (grade 2 diastolic dysfunction). Doppler parameters are   consistent with high ventricular filling pressure. - Aortic valve: Poorly visualized. Sclerosis without stenosis.   There was mild regurgitation. - Mitral valve: Calcified annulus. Mildly thickened leaflets . - Left atrium: The atrium was mildly dilated. - Right ventricle: The cavity size was at the upper limits of   normal. Wall thickness was increased. Systolic function was   normal. - Right atrium: The atrium was mildly dilated. - Pulmonary arteries: Systolic pressure was mildly to moderately   increased, in the range of 35 mm Hg to 40 mm Hg plus central   venous/right atrial pressure.   Laboratory Data:  High Sensitivity Troponin:   Recent Labs  Lab 12/31/18 2046 01/01/19 0520  TROPONINIHS 11 163*     Cardiac EnzymesNo results for input(s): TROPONINI in the last 168 hours. No results for input(s): TROPIPOC in the last 168 hours.  Chemistry Recent Labs  Lab 12/31/18 2046 01/01/19 0520  NA 142 142  K 3.9 3.4*  CL 106 110  CO2 27 25  GLUCOSE 192* 103*  BUN 26* 20  CREATININE 1.71* 1.17*  CALCIUM 8.7* 8.4*  GFRNONAA 27* 42*  GFRAA 31* 49*   ANIONGAP 9 7    No results for input(s): PROT, ALBUMIN, AST, ALT, ALKPHOS, BILITOT in the last 168 hours. Hematology Recent Labs  Lab 12/31/18 2046 01/01/19 0520  WBC 8.2 8.3  RBC 4.70 4.40  HGB 13.4 12.6  HCT 41.6 39.0  MCV 88.5 88.6  MCH 28.5 28.6  MCHC 32.2 32.3  RDW 14.6 14.6  PLT 243 220   BNP Recent Labs  Lab 12/31/18 2046  BNP 357.0*    DDimer No results for input(s): DDIMER in the last 168 hours.   Radiology/Studies:  Dg Chest Portable 1 View  Result Date: 12/31/2018 CLINICAL DATA:  Chest pain EXAM: PORTABLE CHEST 1 VIEW COMPARISON:  September 15, 2017 FINDINGS: There is cardiomegaly. Pulmonary vascular congestion and mildly increased interstitial markings seen throughout both lungs. No pleural effusion. There is hyperinflation of the lung zones. No acute osseous abnormality. IMPRESSION: Cardiomegaly with pulmonary vascular congestion/interstitial edema. Electronically Signed   By: Prudencio Pair M.D.   On: 12/31/2018 21:18    Assessment and Plan:   Atypical chest pain, likely supply demand ischemia --Currently no chest pain. Chest pressure that started shortly after eating 9/20 and persisted until receiving IV metoprolol in the ED.  --HS Tn minimally elevated in the setting of rapid rate / new onset atrial fibrillation with RVR 11  163 and more consistent with supply demand ischemia than ACS. EKG without any acute ST/T changes. Risks factors for cardiac etiology include HTN, HLD. Pending A1C. --Scheduled for a stress test this AM. If stress test low risk and echo without any significant decrease in EF, can likely be discharged home and plan for follow-up in the clinic.   New Onset Atrial fibrillation with RVR --Currently in SR. Earlier Afib with RVR and converted around 5:40AM this morning. New onset with unclear trigger, likely occurring shortly after dinner. --CHA2DS2VASc score of at least  (CHF, HTN, agex2, vascular/carotid, female) 6 with recommendation for long term  [place].  --Continue Eliquis 5mg  BID, started this admission. Patient denies any history consistent with s/sx of acute bleeding leading up to admission. No recent falls. --Continue PTA Coreg for rate control.  --Follow-up in the office as above.   HTN --BP suboptimally controlled. PTA antihypertensives and diuretics including hydralazine, lisinopril, and lasix held at admission due to elevated Cr to 1.71, which has now improved to Cr 1.17 and at baseline.  Suspect improvement in BP with restart of these medications.  --Restart PTA amlodipine, lasix, hydralazine, and lisinopril as tolerated. In the future, could consider consolidation of lasix and hydralazine to prevent electrolyte abnormalities. Will restart lisinopril 40mg  daily for now and after stress test.   Hypokalemia --K 3.4. Replete with goal 4.0. --Check Mg. Goal 2.0.  Chronic HFpEF --Euvolemic on exam. BNP as above and not significantly elevated at 357.0. Pending updated echo with most recent echo as above and showing mildly to moderately elevated PASP. Further recommendations pending echo. Of note, she does have PTA lasix 20mg  daily, hydralazine, amlodipine, and lisinopril that have not been continued this admission and will need restarted prior to her discharge. Restarted lisinopril.   HLD --Continue statin therapy.  For questions or updates, please contact [place] Please consult [email or web address] for contact info under     Signed, Arvil Chaco, PA-C  01/01/2019 8:11 AM

## 2019-01-01 NOTE — Plan of Care (Signed)
  Problem: Education: Goal: Knowledge of General Education information will improve Description: Including pain rating scale, medication(s)/side effects and non-pharmacologic comfort measures Outcome: Progressing   Problem: Pain Managment: Goal: General experience of comfort will improve Outcome: Progressing   Problem: Safety: Goal: Ability to remain free from injury will improve Outcome: Progressing   

## 2019-01-01 NOTE — Progress Notes (Signed)
Pt belongings was taken home by pt daughter Seymour Bars ( daughter was called states she does have the pt belonging with her). Will continue to monitor.

## 2019-01-01 NOTE — Progress Notes (Signed)
PT Cancellation Note  Patient Details Name: Joan Willis MRN: [id number] DOB: 12-15-1929   Cancelled Treatment:    Reason Eval/Treat Not Completed: Medical issues which prohibited therapy(Consult received and chart reviewed.  Patient noted with elevated high-sensitivity troponin (163), pending cardiology consult at this time.  Will hold at this time until consult complete and patient cleared for exertional activity.)   Rachael Ferrie H. Owens Shark, PT, DPT, NCS 01/01/19, 9:29 AM [phone]

## 2019-01-01 NOTE — Progress Notes (Signed)
Lab called and reported a critical troponin of 163. Notify prime and talked to Dr. Marcille Blanco. No new order was place. Will continue to monitor.

## 2019-01-01 NOTE — Telephone Encounter (Signed)
TCM- call patient on 01/02/19 Patient is scheduled for a lexiscan and echo today.

## 2019-01-01 NOTE — TOC Benefit Eligibility Note (Signed)
Transition of Care [facility]) Benefit Eligibility Note    Patient Details  Name: [name] MRN: [id number] Date of Birth: 1930/03/17   Medication/Dose: Eliquis 5mg  BID  Covered?: Yes  Tier: 3 Drug  Prescription Coverage Preferred Pharmacy: [facility]  Spoke with Person/Company/Phone Number:: Lizzie with [employer] at [phone]  Co-Pay: $47.00 estimated copay for 30 day supply, $131.00 estimated copay for 90 day supply.  Estimates apply for both retail and mail order.  Prior Approval: No  Deductible: Unmet($50 deductible, unmet as of time of call.)    Dannette Barbara Phone Number: [phone] or [phone] 01/01/2019, 3:28 PM

## 2019-01-01 NOTE — Progress Notes (Signed)
[place] [facility] [place] NAME: Joan Willis    MR#:  [id number]  DATE OF BIRTH:  [birth year]  SUBJECTIVE:  CHIEF COMPLAINT:   Chief Complaint  Patient presents with  . Chest Pain   -Patient admitted with chest pain and noted to be in A. fib RVR.  Converted to normal sinus rhythm this morning.  Going for stress test.  REVIEW OF SYSTEMS:  Review of Systems  Constitutional: Negative for chills, fever and malaise/fatigue.  HENT: Negative for congestion, ear discharge, hearing loss and nosebleeds.   Eyes: Negative for blurred vision and double vision.  Respiratory: Negative for cough, shortness of breath and wheezing.   Cardiovascular: Negative for chest pain, palpitations and leg swelling.  Gastrointestinal: Negative for abdominal pain, constipation, diarrhea, nausea and vomiting.  Genitourinary: Negative for dysuria.  Musculoskeletal: Negative for myalgias.  Neurological: Negative for dizziness, focal weakness, seizures, weakness and headaches.  Psychiatric/Behavioral: Negative for depression.    DRUG ALLERGIES:  No Known Allergies  VITALS:  Blood pressure (!) 173/69, pulse (!) 49, temperature 97.6 F (36.4 C), temperature source Oral, resp. rate 17, height 5\' 5"  (1.651 m), weight 80.1 kg, SpO2 96 %.  PHYSICAL EXAMINATION:  Physical Exam   GENERAL:  85 y.o.-year-old elderly patient sitting in the recliner with no acute distress.  EYES: Pupils equal, round, reactive to light and accommodation. No scleral icterus. Extraocular muscles intact.  HEENT: Head atraumatic, normocephalic. Oropharynx and nasopharynx clear.  NECK:  Supple, no jugular venous distention. No thyroid enlargement, no tenderness.  LUNGS: Normal breath sounds bilaterally, no wheezing, rales,rhonchi or crepitation. No use of accessory muscles of respiration.  CARDIOVASCULAR: S1, S2 normal. No  rubs, or gallops.  2/6 systolic murmur is present ABDOMEN: Soft, nontender,  nondistended. Bowel sounds present. No organomegaly or mass.  EXTREMITIES: No pedal edema, cyanosis, or clubbing.  NEUROLOGIC: Cranial nerves II through XII are intact. Muscle strength 5/5 in all extremities. Sensation intact. Gait not checked.  PSYCHIATRIC: The patient is alert and oriented x 3.  SKIN: No obvious rash, lesion, or ulcer.    LABORATORY PANEL:   CBC Recent Labs  Lab 01/01/19 0520  WBC 8.3  HGB 12.6  HCT 39.0  PLT 220   ------------------------------------------------------------------------------------------------------------------  Chemistries  Recent Labs  Lab 12/31/18 2046 01/01/19 0520  NA 142 142  K 3.9 3.4*  CL 106 110  CO2 27 25  GLUCOSE 192* 103*  BUN 26* 20  CREATININE 1.71* 1.17*  CALCIUM 8.7* 8.4*  MG 2.2  --    ------------------------------------------------------------------------------------------------------------------  Cardiac Enzymes No results for input(s): TROPONINI in the last 168 hours. ------------------------------------------------------------------------------------------------------------------  RADIOLOGY:  Dg Chest Portable 1 View  Result Date: 12/31/2018 CLINICAL DATA:  Chest pain EXAM: PORTABLE CHEST 1 VIEW COMPARISON:  September 15, 2017 FINDINGS: There is cardiomegaly. Pulmonary vascular congestion and mildly increased interstitial markings seen throughout both lungs. No pleural effusion. There is hyperinflation of the lung zones. No acute osseous abnormality. IMPRESSION: Cardiomegaly with pulmonary vascular congestion/interstitial edema. Electronically Signed   By: Prudencio Pair M.D.   On: 12/31/2018 21:18    EKG:   Orders placed or performed during the hospital encounter of 12/31/18  . EKG 12-Lead  . EKG 12-Lead    ASSESSMENT AND PLAN:   85 year old female with past medical history significant for hypertension, hypothyroidism, hyperlipidemia who is independent at baseline comes from home secondary to chest pressure  that started yesterday.  1.  New onset atrial fibrillation with  rapid ventricular response-converted back to normal sinus rhythm. -Patient on carvedilol at home.  Will continue that -Appreciate cardiology consult. -Discontinue IV heparin and started on Eliquis for stroke prophylaxis.  2.  Elevated troponin-demand ischemia from elevated heart rate on presentation.  Stabilized. -Myoview either as inpatient or outpatient.  3.  Hypertension-restart home medications.  Patient on Norvasc, lisinopril, Coreg and hydralazine  4.  Hypothyroidism-Synthroid  5.  DVT prophylaxis-started on Eliquis  Independent at baseline.  PT consult pending. -Anticipate discharge later today if stress test is negative     All the records are reviewed and case discussed with Care Management/Social Workerr. Management plans discussed with the patient, family and they are in agreement.  CODE STATUS: Full code  TOTAL TIME TAKING CARE OF THIS PATIENT: 38 minutes.   POSSIBLE D/C 1-2 DAYS, DEPENDING ON CLINICAL CONDITION.   Enid Baas M.D on 01/01/2019 at 11:51 AM  Between 7am to 6pm - Pager - [phone]  After 6pm go to [email or web address] - Social research officer, government  [facility]  Office  [phone]  CC: Primary care physician; Leanna Sato, MD

## 2019-01-01 NOTE — Telephone Encounter (Signed)
-----   Message from [place], MD sent at 01/01/2019  3:29 PM EDT ----- TCM follow up needed in 1-2 weeks ( Afib and chest pain)

## 2019-01-01 NOTE — Plan of Care (Signed)
  Problem: Education: Goal: Knowledge of General Education information will improve Description: Including pain rating scale, medication(s)/side effects and non-pharmacologic comfort measures 01/01/2019 1641 by Chriss Czar, Illene Bolus, RN Outcome: Adequate for Discharge 01/01/2019 1641 by Chriss Czar, Illene Bolus, RN Outcome: Adequate for Discharge   Problem: Health Behavior/Discharge Planning: Goal: Ability to manage health-related needs will improve 01/01/2019 1641 by Chriss Czar, Illene Bolus, RN Outcome: Adequate for Discharge 01/01/2019 1641 by Chriss Czar, Illene Bolus, RN Outcome: Adequate for Discharge   Problem: Clinical Measurements: Goal: Ability to maintain clinical measurements within normal limits will improve 01/01/2019 1641 by Feliberto Gottron, RN Outcome: Adequate for Discharge 01/01/2019 1641 by Chriss Czar, Illene Bolus, RN Outcome: Adequate for Discharge Goal: Will remain free from infection 01/01/2019 1641 by Feliberto Gottron, RN Outcome: Adequate for Discharge 01/01/2019 1641 by Chriss Czar, Illene Bolus, RN Outcome: Adequate for Discharge Goal: Diagnostic test results will improve 01/01/2019 1641 by Feliberto Gottron, RN Outcome: Adequate for Discharge 01/01/2019 1641 by Chriss Czar, Illene Bolus, RN Outcome: Adequate for Discharge Goal: Respiratory complications will improve 01/01/2019 1641 by Feliberto Gottron, RN Outcome: Adequate for Discharge 01/01/2019 1641 by Chriss Czar, Illene Bolus, RN Outcome: Adequate for Discharge Goal: Cardiovascular complication will be avoided 01/01/2019 1641 by Feliberto Gottron, RN Outcome: Adequate for Discharge 01/01/2019 1641 by Feliberto Gottron, RN Outcome: Adequate for Discharge   Problem: Activity: Goal: Risk for activity intolerance will decrease 01/01/2019 1641 by Chriss Czar, Illene Bolus, RN Outcome: Adequate for Discharge 01/01/2019 1641 by Chriss Czar, Illene Bolus, RN Outcome:  Adequate for Discharge   Problem: Nutrition: Goal: Adequate nutrition will be maintained 01/01/2019 1641 by Chriss Czar, Illene Bolus, RN Outcome: Adequate for Discharge 01/01/2019 1641 by Chriss Czar, Illene Bolus, RN Outcome: Adequate for Discharge   Problem: Coping: Goal: Level of anxiety will decrease 01/01/2019 1641 by Feliberto Gottron, RN Outcome: Adequate for Discharge 01/01/2019 1641 by Chriss Czar, Illene Bolus, RN Outcome: Adequate for Discharge   Problem: Elimination: Goal: Will not experience complications related to bowel motility 01/01/2019 1641 by Feliberto Gottron, RN Outcome: Adequate for Discharge 01/01/2019 1641 by Chriss Czar, Illene Bolus, RN Outcome: Adequate for Discharge Goal: Will not experience complications related to urinary retention 01/01/2019 1641 by Feliberto Gottron, RN Outcome: Adequate for Discharge 01/01/2019 1641 by Chriss Czar, Illene Bolus, RN Outcome: Adequate for Discharge   Problem: Pain Managment: Goal: General experience of comfort will improve 01/01/2019 1641 by Feliberto Gottron, RN Outcome: Adequate for Discharge 01/01/2019 1641 by Chriss Czar, Illene Bolus, RN Outcome: Adequate for Discharge   Problem: Safety: Goal: Ability to remain free from injury will improve 01/01/2019 1641 by Feliberto Gottron, RN Outcome: Adequate for Discharge 01/01/2019 1641 by Chriss Czar, Illene Bolus, RN Outcome: Adequate for Discharge   Problem: Skin Integrity: Goal: Risk for impaired skin integrity will decrease 01/01/2019 1641 by Feliberto Gottron, RN Outcome: Adequate for Discharge 01/01/2019 1641 by Feliberto Gottron, RN Outcome: Adequate for Discharge

## 2019-01-01 NOTE — Progress Notes (Signed)
*  PRELIMINARY RESULTS* Echocardiogram 2D Echocardiogram has been performed.  Sherrie Sport 01/01/2019, 9:15 AM

## 2019-01-01 NOTE — Telephone Encounter (Signed)
TCM....  Patient is being discharged   They saw Fletcher Anon   They are scheduled to see Ryan 10/5 at 3   They were seen for Afib CP  They need to be seen within 1-2 weeks     Please call

## 2019-01-01 NOTE — Progress Notes (Signed)
Patient given discharge instructions with daughter at bedside. IV taken out and tele monitor off. Patient verbalized understanding without any questions or concerns. Patient discharging home stable.

## 2019-01-02 NOTE — Discharge Summary (Signed)
[place] [facility] [place] NAME: Joan Willis    MR#:  [id number]  DATE OF BIRTH:  Feb 27, 1930  DATE OF ADMISSION:  12/31/2018   ADMITTING PHYSICIAN: Lance Coon, MD  DATE OF DISCHARGE: 01/01/2019  4:43 PM  PRIMARY CARE PHYSICIAN: Marguerita Merles, MD   ADMISSION DIAGNOSIS:   Shortness of breath [R06.02] Atrial fibrillation, unspecified type ([place]) [I48.91] Chest pain, unspecified type [R07.9]  DISCHARGE DIAGNOSIS:   Principal Problem:   Atrial fibrillation with RVR ([place]) Active Problems:   HTN (hypertension)   HLD (hyperlipidemia)   Hypothyroidism   SECONDARY DIAGNOSIS:   Past Medical History:  Diagnosis Date  . Asthma   . Hyperlipemia   . Hypertension   . Hypothyroid     HOSPITAL COURSE:   85 year old female with past medical history significant for hypertension, hypothyroidism, hyperlipidemia who is independent at baseline comes from home secondary to chest pressure that started yesterday.  1.  New onset atrial fibrillation with rapid ventricular response-converted back to normal sinus rhythm. -Patient on carvedilol at home.  Will continue that-however Coreg dose was reduced due to bradycardia in the hospital. -Appreciate cardiology consult. - started on Eliquis for stroke prophylaxis.  2.  Elevated troponin-demand ischemia from elevated heart rate on presentation.  Stabilized. -Myoview done -which is a low risk study, normal LVEF.  No ST segment deviation during stress noted. -Outpatient follow-up with cardiology recommended  3.  Hypertension-restarted home medications.  Patient on Norvasc, lisinopril, Coreg, Lasix and hydralazine  4.  Hypothyroidism-Synthroid   Independent at baseline.   DISCHARGE CONDITIONS:   Guarded  CONSULTS OBTAINED:   Cardiology consultation by Dr. Fletcher Anon  DRUG ALLERGIES:   No Known Allergies DISCHARGE MEDICATIONS:   Allergies as of 01/01/2019   No Known Allergies      Medication List    TAKE these medications   albuterol (2.5 MG/3ML) 0.083% nebulizer solution Commonly known as: PROVENTIL Inhale 2.5 mLs into the lungs every 4 (four) hours as needed for wheezing or shortness of breath.   albuterol 108 (90 Base) MCG/ACT inhaler Commonly known as: VENTOLIN HFA Inhale 2 puffs into the lungs every 6 (six) hours as needed for wheezing or shortness of breath.   amLODipine 10 MG tablet Commonly known as: NORVASC Take 10 mg by mouth daily.   apixaban 5 MG Tabs tablet Commonly known as: ELIQUIS Take 1 tablet (5 mg total) by mouth 2 (two) times daily.   atorvastatin 40 MG tablet Commonly known as: LIPITOR Take 40 mg by mouth daily.   carvedilol 12.5 MG tablet Commonly known as: COREG Take 1 tablet (12.5 mg total) by mouth 2 (two) times daily. What changed:   medication strength  how much to take   Flovent HFA 220 MCG/ACT inhaler Generic drug: fluticasone Inhale 2 puffs into the lungs daily.   furosemide 20 MG tablet Commonly known as: LASIX Take 20 mg by mouth daily.   hydrALAZINE 50 MG tablet Commonly known as: APRESOLINE Take 50 mg by mouth 3 (three) times daily.   levothyroxine 100 MCG tablet Commonly known as: SYNTHROID Take 100 mcg by mouth daily.   lisinopril 40 MG tablet Commonly known as: ZESTRIL Take 40 mg by mouth daily.   montelukast 10 MG tablet Commonly known as: SINGULAIR Take 10 mg by mouth daily.        DISCHARGE INSTRUCTIONS:   1. PCP f/u in 1 to 2 weeks 2.  Cardiology follow-up in 2 weeks  DIET:  Cardiac diet  ACTIVITY:   Activity as tolerated  OXYGEN:   Home Oxygen: No.  Oxygen Delivery: room air  DISCHARGE LOCATION:   home   If you experience worsening of your admission symptoms, develop shortness of breath, life threatening emergency, suicidal or homicidal thoughts you must seek medical attention immediately by calling 911 or calling your MD immediately  if symptoms less severe.  You  Must read complete instructions/literature along with all the possible adverse reactions/side effects for all the Medicines you take and that have been prescribed to you. Take any new Medicines after you have completely understood and accpet all the possible adverse reactions/side effects.   Please note  You were cared for by a hospitalist during your hospital stay. If you have any questions about your discharge medications or the care you received while you were in the hospital after you are discharged, you can call the unit and asked to speak with the hospitalist on call if the hospitalist that took care of you is not available. Once you are discharged, your primary care physician will handle any further medical issues. Please note that NO REFILLS for any discharge medications will be authorized once you are discharged, as it is imperative that you return to your primary care physician (or establish a relationship with a primary care physician if you do not have one) for your aftercare needs so that they can reassess your need for medications and monitor your lab values.    On the day of Discharge:  VITAL SIGNS:   Blood pressure (!) 169/58, pulse 94, temperature 97.8 F (36.6 C), temperature source Oral, resp. rate 17, height 5\' 5"  (1.651 m), weight 80.1 kg, SpO2 96 %.  PHYSICAL EXAMINATION:    GENERAL:  85 y.o.-year-old patient lying in the bed with no acute distress.  EYES: Pupils equal, round, reactive to light and accommodation. No scleral icterus. Extraocular muscles intact.  HEENT: Head atraumatic, normocephalic. Oropharynx and nasopharynx clear.  NECK:  Supple, no jugular venous distention. No thyroid enlargement, no tenderness.  LUNGS: Normal breath sounds bilaterally, no wheezing, rales,rhonchi or crepitation. No use of accessory muscles of respiration.  CARDIOVASCULAR: S1, S2 normal. No  rubs, or gallops.  2/6 systolic murmur is present ABDOMEN: Soft, non-tender, non-distended.  Bowel sounds present. No organomegaly or mass.  EXTREMITIES: No pedal edema, cyanosis, or clubbing.  NEUROLOGIC: Cranial nerves II through XII are intact. Muscle strength 5/5 in all extremities. Sensation intact. Gait not checked.  PSYCHIATRIC: The patient is alert and oriented x 3.  SKIN: No obvious rash, lesion, or ulcer.   DATA REVIEW:   CBC Recent Labs  Lab 01/01/19 0520  WBC 8.3  HGB 12.6  HCT 39.0  PLT 220    Chemistries  Recent Labs  Lab 12/31/18 2046 01/01/19 0520  NA 142 142  K 3.9 3.4*  CL 106 110  CO2 27 25  GLUCOSE 192* 103*  BUN 26* 20  CREATININE 1.71* 1.17*  CALCIUM 8.7* 8.4*  MG 2.2  --      Microbiology Results  Results for orders placed or performed during the hospital encounter of 12/31/18  SARS CORONAVIRUS 2 (TAT 6-24 HRS) Nasopharyngeal Nasopharyngeal Swab     Status: None   Collection Time: 12/31/18  9:46 PM   Specimen: Nasopharyngeal Swab  Result Value Ref Range Status   SARS Coronavirus 2 NEGATIVE NEGATIVE Final    Comment: (NOTE) SARS-CoV-2 target nucleic acids are NOT DETECTED. The SARS-CoV-2 RNA is generally detectable in upper  and lower respiratory specimens during the acute phase of infection. Negative results do not preclude SARS-CoV-2 infection, do not rule out co-infections with other pathogens, and should not be used as the sole basis for treatment or other patient management decisions. Negative results must be combined with clinical observations, patient history, and epidemiological information. The expected result is Negative. Fact Sheet for Patients: HairSlick.no Fact Sheet for Healthcare Providers: quierodirigir.com This test is not yet approved or cleared by the [place] FDA and  has been authorized for detection and/or diagnosis of SARS-CoV-2 by FDA under an Emergency Use Authorization (EUA). This EUA will remain  in effect (meaning this test can be used) for the  duration of the COVID-19 declaration under Section 56 4(b)(1) of the Act, 21 U.S.C. section 360bbb-3(b)(1), unless the authorization is terminated or revoked sooner. Performed at [facility], [address] [place], [place] [address]     RADIOLOGY:  No results found.   Management plans discussed with the patient, family and they are in agreement.  CODE STATUS:  Code Status History    Date Active Date Inactive Code Status Order ID Comments User Context   01/01/2019 0002 01/01/2019 1949 Full Code [id number]  Oralia Manis, MD Inpatient   09/15/2017 1115 09/17/2017 1724 Full Code [id number]  Auburn Bilberry, MD Inpatient   07/12/2017 1344 07/13/2017 1900 Full Code [id number]  Enedina Finner, MD Inpatient   Advance Care Planning Activity    Advance Directive Documentation     Most Recent Value  Type of Advance Directive  Healthcare Power of Attorney, Living will  Pre-existing out of facility DNR order (yellow form or pink MOST form)  -  "MOST" Form in Place?  -      TOTAL TIME TAKING CARE OF THIS PATIENT: 38 minutes.    Enid Baas M.D on 01/02/2019 at 2:30 PM  Between 7am to 6pm - Pager - [phone]  After 6pm go to [email or web address] - Social research officer, government  [employer] [facility]  Office  [phone]  CC: Primary care physician; Leanna Sato, MD   Note: This dictation was prepared with Dragon dictation along with smaller phrase technology. Any transcriptional errors that result from this process are unintentional.

## 2019-01-04 NOTE — Telephone Encounter (Signed)
Left message for patient to call office.  

## 2019-01-09 NOTE — Telephone Encounter (Signed)
No answer [phone]). Left message to call back.   No answer [phone]). After several rings no answer and no VM.  No answer on patient's daughter, DPR. Left message to call back.

## 2019-01-10 NOTE — Telephone Encounter (Signed)
3rd TCM Attempt.  No answer. Left message to call back.  Closing encounter.

## 2019-01-12 NOTE — Progress Notes (Signed)
Cardiology Office Note    Date:  01/15/2019   ID:  Joan Willis, DOB Aug 28, 1929, MRN [id number]  PCP:  Leanna Sato, MD  Cardiologist:  Lorine Bears, MD  Electrophysiologist:  None   Chief Complaint: Hospital follow up  History of Present Illness:   Joan Willis is a 85 y.o. female with history of recently diagnosed A. fib as detailed below on Eliquis, HFpEF, mild aortic stenosis, carotid artery disease status post carotid endarterectomy with details being unclear, hypertension, hyperlipidemia, hypothyroidism, and asthma who presents for hospital follow-up.  She was admitted to the hospital in 07/2017 with acute on chronic diastolic CHF, bronchitis, and poorly controlled blood pressure.  Echo at that time showed an EF of 60 to 65%, moderate LVH, grade 2 diastolic dysfunction, aortic sclerosis without stenosis, mild aortic regurgitation, mildly dilated left atrium, normal RV systolic function, and mildly elevated PASP ranging from 35 to 40 mmHg.  More recently, she was admitted to the hospital in 12/2018 with substernal chest pressure and shortness of breath.  She was noted to be in A. fib with RVR.  High-sensitivity troponin noted to be 11 with a delta of 163.  While admitted, she converted to sinus rhythm.  Echo showed an EF of 55 to 60%, no LVH, diastolic dysfunction, normal RV systolic function, mildly dilated left atrium, mild mitral regurgitation, mild tricuspid regurgitation, mild aortic stenosis, moderately elevated PASP.  Lexiscan Myoview showed a small defect of mild severity present in the apical anterior and apex location likely due to breast attenuation, EF 55 to 65%, low risk study.  She comes in doing well since her hospital discharge.  She has noted the development of a pruritic rash involving her abdomen and back primarily with occasional lesions on her arms that seem to correspond to when she takes her Eliquis.  She notes the pruritus seems to be getting worse with each  dose of Eliquis.  However, despite the rash and pruritus she remains compliant with anticoagulation.  No chest pain, shortness of breath, palpitations, dizziness, presyncope, syncope, lower extremity swelling, abdominal distention, orthopnea, PND, or early satiety.  No falls, BRBPR, or melena.  Blood pressure at home typically runs in the 140s to 150s systolic.   Labs: 12/2018 - A1c 5.6, potassium 3.4, serum creatinine 1.17, Hgb 12.6, PLT 220, TSH normal, magnesium 2.2  Past Medical History:  Diagnosis Date   (HFpEF) heart failure with preserved ejection fraction (HCC)    a. TTE 9/20 - EF 55 to 60%, no LVH, diastolic dysfunction, normal RV systolic function, mildly dilated left atrium, mild mitral regurgitation, mild tricuspid regurgitation, mild aortic stenosis, moderately elevated PASP   Asthma    Carotid artery disease (HCC)    Hyperlipemia    Hypertension    Hypothyroid    PAF (paroxysmal atrial fibrillation) (HCC)    a.  Diagnosed 9/20; b. CHADS2VASc 6 (CHF, HTN, age x 2, vascular disease, female); c. on Eliquis    Past Surgical History:  Procedure Laterality Date   ABDOMINAL HYSTERECTOMY     APPENDECTOMY     arm surgery     cartotid endaractomy      Current Medications: Current Meds  Medication Sig   albuterol (PROVENTIL HFA;VENTOLIN HFA) 108 (90 Base) MCG/ACT inhaler Inhale 2 puffs into the lungs every 6 (six) hours as needed for wheezing or shortness of breath.   albuterol (PROVENTIL) (2.5 MG/3ML) 0.083% nebulizer solution Inhale 2.5 mLs into the lungs every 4 (four) hours as needed  for wheezing or shortness of breath.    amLODipine (NORVASC) 10 MG tablet Take 10 mg by mouth daily.   apixaban (ELIQUIS) 5 MG TABS tablet Take 1 tablet (5 mg total) by mouth 2 (two) times daily.   atorvastatin (LIPITOR) 40 MG tablet Take 40 mg by mouth daily.   carvedilol (COREG) 12.5 MG tablet Take 1 tablet (12.5 mg total) by mouth 2 (two) times daily.   FLOVENT HFA 220  MCG/ACT inhaler Inhale 2 puffs into the lungs daily.   furosemide (LASIX) 20 MG tablet Take 20 mg by mouth daily.   hydrALAZINE (APRESOLINE) 50 MG tablet Take 50 mg by mouth 3 (three) times daily.   levothyroxine (SYNTHROID, LEVOTHROID) 100 MCG tablet Take 100 mcg by mouth daily.   lisinopril (PRINIVIL,ZESTRIL) 40 MG tablet Take 40 mg by mouth daily.   montelukast (SINGULAIR) 10 MG tablet Take 10 mg by mouth daily.    Allergies:   Patient has no known allergies.   Social History   Socioeconomic History   Marital status: Widowed    Spouse name: Not on file   Number of children: Not on file   Years of education: Not on file   Highest education level: Not on file  Occupational History   Not on file  Social Needs   Financial resource strain: Not on file   Food insecurity    Worry: Not on file    Inability: Not on file   Transportation needs    Medical: Not on file    Non-medical: Not on file  Tobacco Use   Smoking status: Never Smoker   Smokeless tobacco: Never Used   Tobacco comment: pt states she has never smoked  Substance and Sexual Activity   Alcohol use: Never    Frequency: Never   Drug use: Never   Sexual activity: Not on file  Lifestyle   Physical activity    Days per week: Not on file    Minutes per session: Not on file   Stress: Not on file  Relationships   Social connections    Talks on phone: Not on file    Gets together: Not on file    Attends religious service: Not on file    Active member of club or organization: Not on file    Attends meetings of clubs or organizations: Not on file    Relationship status: Not on file  Other Topics Concern   Not on file  Social History Narrative   Not on file     Family History:  The patient's family history includes Hypertension in her mother.  ROS:   Review of Systems  Constitutional: Negative for chills, diaphoresis, fever, malaise/fatigue and weight loss.  HENT: Negative for  congestion.   Eyes: Negative for discharge and redness.  Respiratory: Negative for cough, hemoptysis, sputum production, shortness of breath and wheezing.   Cardiovascular: Negative for chest pain, palpitations, orthopnea, claudication, leg swelling and PND.  Gastrointestinal: Negative for abdominal pain, blood in stool, heartburn, melena, nausea and vomiting.  Genitourinary: Negative for hematuria.  Musculoskeletal: Negative for falls and myalgias.  Skin: Positive for itching and rash.  Neurological: Negative for dizziness, tingling, tremors, sensory change, speech change, focal weakness, loss of consciousness and weakness.  Endo/Heme/Allergies: Does not bruise/bleed easily.  Psychiatric/Behavioral: Negative for substance abuse. The patient is not nervous/anxious.   All other systems reviewed and are negative.    EKGs/Labs/Other Studies Reviewed:    Studies reviewed were summarized above. The additional studies  were reviewed today:  2D Echo 12/2018: 1. Left ventricular ejection fraction, by visual estimation, is 55 to 60%. The left ventricle has normal function. Normal left ventricular size. There is no left ventricular hypertrophy.  2. Left ventricular diastolic Doppler parameters are consistent with pseudonormalization pattern of LV diastolic filling.  3. Global right ventricle has normal systolic function.The right ventricular size is normal. No increase in right ventricular wall thickness.  4. Left atrial size was mildly dilated.  5. Right atrial size was normal.  6. The mitral valve is grossly normal. Mild mitral valve regurgitation. No evidence of mitral stenosis.  7. The tricuspid valve is grossly normal. Tricuspid valve regurgitation is mild.  8. The aortic valve is abnormal Aortic valve regurgitation was not visualized by color flow Doppler. Mild aortic valve stenosis.  9. The pulmonic valve was normal in structure. Pulmonic valve regurgitation is not visualized by color flow  Doppler. 10. Moderately elevated pulmonary artery systolic pressure. __________  Myoview 12/2018:  There was no ST segment deviation noted during stress.  Defect 1: There is a small defect of mild severity present in the apical anterior and apex location. This is likely due to breast attenuation.  The study is normal.  This is a low risk study.  The left ventricular ejection fraction is normal (55-65%).   EKG:  EKG is ordered today.  The EKG ordered today demonstrates sinus bradycardia, 59 bpm, left axis deviation, LVH with early repolarization abnormality, possible prior septal infarct, poor R wave progression along the precordial leads, lateral T wave inversion which is unchanged from prior  Recent Labs: 12/31/2018: B Natriuretic Peptide 357.0; Magnesium 2.2; TSH 2.805 01/01/2019: BUN 20; Creatinine, Ser 1.17; Hemoglobin 12.6; Platelets 220; Potassium 3.4; Sodium 142  Recent Lipid Panel No results found for: CHOL, TRIG, HDL, CHOLHDL, VLDL, LDLCALC, LDLDIRECT  PHYSICAL EXAM:    VS:  BP (!) 212/70 (BP Location: Left Arm, Patient Position: Sitting, Cuff Size: Normal)    Ht 5\' 5"  (1.651 m)    Wt 168 lb 12 oz (76.5 kg)    BMI 28.08 kg/m   BMI: Body mass index is 28.08 kg/m.  Physical Exam  Constitutional: She is oriented to person, place, and time. She appears well-developed and well-nourished.  Recheck blood pressure 189/90  HENT:  Head: Normocephalic and atraumatic.  Eyes: Right eye exhibits no discharge. Left eye exhibits no discharge.  Neck: Normal range of motion. No JVD present.  Cardiovascular: Regular rhythm, S1 normal and S2 normal. Bradycardia present. Exam reveals no distant heart sounds, no friction rub, no midsystolic click and no opening snap.  Murmur heard.  Harsh midsystolic murmur is present with a grade of 2/6 at the upper right sternal border radiating to the neck. Pulses:      Posterior tibial pulses are 2+ on the right side and 2+ on the left side.    Pulmonary/Chest: Effort normal and breath sounds normal. No respiratory distress. She has no decreased breath sounds. She has no wheezes. She has no rales. She exhibits no tenderness.  Abdominal: Soft. She exhibits no distension. There is no abdominal tenderness.  Musculoskeletal:        General: No edema.  Neurological: She is alert and oriented to person, place, and time.  Skin: Skin is warm and dry. No cyanosis. Nails show no clubbing.  Psychiatric: She has a normal mood and affect. Her speech is normal and behavior is normal. Judgment and thought content normal.    Wt Readings from Last  3 Encounters:  01/15/19 168 lb 12 oz (76.5 kg)  12/31/18 176 lb 8 oz (80.1 kg)  01/17/18 176 lb 6.4 oz (80 kg)     ASSESSMENT & PLAN:   1. PAF: Maintaining sinus rhythm with a mildly bradycardic heart rate.  Continue rate control with carvedilol.  She did develop a rash with Eliquis and in this setting this has been discontinued.  Based on her Estimated Creatinine Clearance: 35.3 mL/min (A) (by C-G formula based on SCr of 1.17 mg/dL (H)), we will start her on Xarelto 15 mg q. dinner to be taken at this evening in place of her evening dose of Eliquis.  This was discussed with her in detail.  If she does not tolerate Xarelto we will either need to try her on Pradaxa or transition to Coumadin through our Coumadin clinic.  She declines labs to be drawn today stating these were recently drawn by PCP.  After the patient had left we did obtain these labs which showed an A1c and TSH were drawn.  In follow-up recommend trending BMP and CBC given anticoagulation.  2. HFpEF: She appears euvolemic and well compensated.  Is on Lasix 20 mg daily.  CHF education.  3. Aortic stenosis: Mild by recent echo.  Follow-up echo in 24 months.  Asymptomatic.  4. Carotid artery disease: Reportedly status post CEA.  Details unclear.  Patient indicates this is followed by PCP.  On oral anticoagulation in place of aspirin.  Continue  atorvastatin.  Follow-up with PCP as directed.  5. HTN: Initial blood pressure at triage noted to be 212/70 with recheck 189/90.  Patient indicates blood pressure typically runs in the 140s to 150s at home.  Increase hydralazine to 100 mg 3 times daily.  Patient will follow-up in 2 weeks time and bring her home blood pressure cuff for comparison to ours.  Low-sodium diet recommended.  6. HLD: Patient indicates this is followed by PCP.  Remains on atorvastatin.  7. Rash: Patient attributes this to recently started Eliquis.  Discontinue Eliquis.  Start Xarelto as above.  If rash persists despite discontinuation of Eliquis recommend patient follow-up with PCP.  Disposition: F/u with Dr. Kirke Corin or an APP in 2 weeks.   Medication Adjustments/Labs and Tests Ordered: Current medicines are reviewed at length with the patient today.  Concerns regarding medicines are outlined above. Medication changes, Labs and Tests ordered today are summarized above and listed in the Patient Instructions accessible in Encounters.   Signed, Eula Listen, PA-C 01/15/2019 2:56 PM     [facility] [place] [facility] [address] [place], [place] [address] [phone]

## 2019-01-15 ENCOUNTER — Encounter: Payer: Self-pay | Admitting: Physician Assistant

## 2019-01-15 ENCOUNTER — Other Ambulatory Visit: Payer: Self-pay

## 2019-01-15 ENCOUNTER — Ambulatory Visit (INDEPENDENT_AMBULATORY_CARE_PROVIDER_SITE_OTHER): Payer: Medicare Other | Admitting: Physician Assistant

## 2019-01-15 VITALS — BP 212/70 | Ht 65.0 in | Wt 168.8 lb

## 2019-01-15 DIAGNOSIS — I5032 Chronic diastolic (congestive) heart failure: Secondary | ICD-10-CM

## 2019-01-15 DIAGNOSIS — R21 Rash and other nonspecific skin eruption: Secondary | ICD-10-CM

## 2019-01-15 DIAGNOSIS — I350 Nonrheumatic aortic (valve) stenosis: Secondary | ICD-10-CM

## 2019-01-15 DIAGNOSIS — E782 Mixed hyperlipidemia: Secondary | ICD-10-CM

## 2019-01-15 DIAGNOSIS — I779 Disorder of arteries and arterioles, unspecified: Secondary | ICD-10-CM

## 2019-01-15 DIAGNOSIS — I480 Paroxysmal atrial fibrillation: Secondary | ICD-10-CM

## 2019-01-15 DIAGNOSIS — I10 Essential (primary) hypertension: Secondary | ICD-10-CM

## 2019-01-15 MED ORDER — RIVAROXABAN 15 MG PO TABS: 15.0000 mg | ORAL_TABLET | Freq: Every day | ORAL | 3 refills | Status: DC

## 2019-01-15 NOTE — Patient Instructions (Addendum)
Medication Instructions:  Your physician has recommended you make the following change in your medication:  1- STOP Eliquis. DO NOT TAKE ANYMORE DOSES of Eliquis.  2- START Xarelto 15 mg by mouth once a day with supper. TAKE first dose tonight with supper.   If you need a refill on your cardiac medications before your next appointment, please call your pharmacy.   Lab work: Labwork will be requested from your primary care physician.  If you have labs (blood work) drawn today and your tests are completely normal, you will receive your results only by: Marland Kitchen MyChart Message (if you have MyChart) OR . A paper copy in the mail If you have any lab test that is abnormal or we need to change your treatment, we will call you to review the results.  Testing/Procedures: NONE  Follow-Up: At [facility], you and your health needs are our priority.  As part of our continuing mission to provide you with exceptional heart care, we have created designated Provider Care Teams.  These Care Teams include your primary Cardiologist (physician) and Advanced Practice Providers (APPs -  Physician Assistants and Nurse Practitioners) who all work together to provide you with the care you need, when you need it. You will need a follow up appointment in 2 weeks with Thurmond Butts.   You may see Kathlyn Sacramento, MD or one of the following Advanced Practice Providers on your designated Care Team:   Murray Hodgkins, NP Christell Faith, PA-C . Marrianne Mood, PA-C   Medication Samples have been provided to the patient.  Drug name: Xarelto       Strength: 15 mg        Qty: 3 bottles  LOT: [id number]  Exp.Date: 3/22

## 2019-01-18 ENCOUNTER — Ambulatory Visit: Payer: Medicare Other | Admitting: Nurse Practitioner

## 2019-01-18 NOTE — Progress Notes (Signed)
Cardiology Office Note    Date:  01/31/2019   ID:  Joan Willis, DOB November 04, 1929, MRN [id number]  PCP:  Leanna SatoMiles, Linda M, MD  Cardiologist:  Lorine BearsMuhammad Arida, MD  Electrophysiologist:  None   Chief Complaint: Follow up  History of Present Illness:   Joan Willis is a 85 y.o. female with history of recently diagnosed A. fib as detailed below on Xarelto (possible rash with Eliquis), HFpEF, mild aortic stenosis, carotid artery disease status post carotid endarterectomy with details being unclear, hypertension, hyperlipidemia, hypothyroidism, and asthma who presents for follow up of Afib and HFpEF.  She was admitted to the hospital in 07/2017 with acute on chronic diastolic CHF, bronchitis, and poorly controlled blood pressure.  Echo at that time showed an EF of 60 to 65%, moderate LVH, grade 2 diastolic dysfunction, aortic sclerosis without stenosis, mild aortic regurgitation, mildly dilated left atrium, normal RV systolic function, and mildly elevated PASP ranging from 35 to 40 mmHg.  More recently, she was admitted to the hospital in 12/2018 with substernal chest pressure and shortness of breath.  She was noted to be in A. fib with RVR.  High-sensitivity troponin noted to be 11 with a delta of 163.  While admitted, she converted to sinus rhythm.  Echo showed an EF of 55 to 60%, no LVH, diastolic dysfunction, normal RV systolic function, mildly dilated left atrium, mild mitral regurgitation, mild tricuspid regurgitation, mild aortic stenosis, moderately elevated PASP.  Lexiscan Myoview showed a small defect of mild severity present in the apical anterior and apex location likely due to breast attenuation, EF 55 to 65%, low risk study.  She was seen in hospital follow up on 01/15/2019 and was doing well.  She did note a rash that had developed which she attributed to Elqiuis leading her to be changed to renally dosed Xarelto given a CrCl of 35.2 mL/min.  Her BP was noted to be significantly elevated at  212/70 with reported BP at home in the 140-150 systolic range.  Recheck BP of 189/90.  Her hydralazine was increased to 100 mg tid and she was advised to bring her home BP cuff for comparison to her next visit.  She declined labs at that visit, stating they had been done at her PCP's office, though once we received these labs, it was noted only a TSH and A1c were checked.    Patient comes in doing quite well today.  Since transitioning from Eliquis to Xarelto her rash has nearly resolved.  She has also started a low-sodium diet and noted improved blood pressure readings.  She has also picked up a new BP cuff.  Most readings are in the 130s systolic.  She does note bradycardic heart rates in the low 50s bpm for the most part.  She denies any chest pain, shortness of breath, palpitations, dizziness, presyncope, syncope, lower extremity swelling, abdominal distention, orthopnea, PND, early satiety.  No falls, BRBPR, or melena.   Labs: 12/2018 - A1c 5.6, potassium 3.4, serum creatinine 1.17, Hgb 12.6, PLT 220, TSH normal, magnesium 2.2  Past Medical History:  Diagnosis Date   (HFpEF) heart failure with preserved ejection fraction (HCC)    a. TTE 9/20 - EF 55 to 60%, no LVH, diastolic dysfunction, normal RV systolic function, mildly dilated left atrium, mild mitral regurgitation, mild tricuspid regurgitation, mild aortic stenosis, moderately elevated PASP   Asthma    Carotid artery disease (HCC)    Hyperlipemia    Hypertension    Hypothyroid  PAF (paroxysmal atrial fibrillation) ([place])    a.  Diagnosed 9/20; b. CHADS2VASc 6 (CHF, HTN, age x 2, vascular disease, female); c. on Eliquis    Past Surgical History:  Procedure Laterality Date   ABDOMINAL HYSTERECTOMY     APPENDECTOMY     arm surgery     cartotid endaractomy      Current Medications: Current Meds  Medication Sig   albuterol (PROVENTIL HFA;VENTOLIN HFA) 108 (90 Base) MCG/ACT inhaler Inhale 2 puffs into the lungs  every 6 (six) hours as needed for wheezing or shortness of breath.   albuterol (PROVENTIL) (2.5 MG/3ML) 0.083% nebulizer solution Inhale 2.5 mLs into the lungs every 4 (four) hours as needed for wheezing or shortness of breath.    amLODipine (NORVASC) 10 MG tablet Take 10 mg by mouth daily.   atorvastatin (LIPITOR) 40 MG tablet Take 40 mg by mouth daily.   FLOVENT HFA 220 MCG/ACT inhaler Inhale 2 puffs into the lungs daily.   furosemide (LASIX) 20 MG tablet Take 20 mg by mouth daily.   levothyroxine (SYNTHROID, LEVOTHROID) 100 MCG tablet Take 100 mcg by mouth daily.   lisinopril (PRINIVIL,ZESTRIL) 40 MG tablet Take 40 mg by mouth daily.   montelukast (SINGULAIR) 10 MG tablet Take 10 mg by mouth daily.   Rivaroxaban (XARELTO) 15 MG TABS tablet Take 1 tablet (15 mg total) by mouth daily with supper.   [DISCONTINUED] carvedilol (COREG) 12.5 MG tablet Take 1 tablet (12.5 mg total) by mouth 2 (two) times daily.   [DISCONTINUED] hydrALAZINE (APRESOLINE) 50 MG tablet Take 50 mg by mouth 3 (three) times daily.    Allergies:   Patient has no known allergies.   Social History   Socioeconomic History   Marital status: Widowed    Spouse name: Not on file   Number of children: Not on file   Years of education: Not on file   Highest education level: Not on file  Occupational History   Not on file  Social Needs   Financial resource strain: Not on file   Food insecurity    Worry: Not on file    Inability: Not on file   Transportation needs    Medical: Not on file    Non-medical: Not on file  Tobacco Use   Smoking status: Never Smoker   Smokeless tobacco: Never Used   Tobacco comment: pt states she has never smoked  Substance and Sexual Activity   Alcohol use: Never    Frequency: Never   Drug use: Never   Sexual activity: Not on file  Lifestyle   Physical activity    Days per week: Not on file    Minutes per session: Not on file   Stress: Not on file    Relationships   Social connections    Talks on phone: Not on file    Gets together: Not on file    Attends religious service: Not on file    Active member of club or organization: Not on file    Attends meetings of clubs or organizations: Not on file    Relationship status: Not on file  Other Topics Concern   Not on file  Social History Narrative   Not on file     Family History:  The patient's family history includes Hypertension in her mother.  ROS:   Review of Systems  Constitutional: Negative for chills, diaphoresis, fever, malaise/fatigue and weight loss.  HENT: Negative for congestion.   Eyes: Negative for discharge and redness.  Respiratory: Negative for cough, hemoptysis, sputum production, shortness of breath and wheezing.   Cardiovascular: Negative for chest pain, palpitations, orthopnea, claudication, leg swelling and PND.  Gastrointestinal: Negative for abdominal pain, blood in stool, heartburn, melena, nausea and vomiting.  Genitourinary: Negative for hematuria.  Musculoskeletal: Negative for falls and myalgias.  Skin: Positive for rash.       Improved rash  Neurological: Negative for dizziness, tingling, tremors, sensory change, speech change, focal weakness, loss of consciousness and weakness.  Endo/Heme/Allergies: Does not bruise/bleed easily.  Psychiatric/Behavioral: Negative for substance abuse. The patient is not nervous/anxious.   All other systems reviewed and are negative.    EKGs/Labs/Other Studies Reviewed:    Studies reviewed were summarized above. The additional studies were reviewed today:  2D Echo 12/2018: 1. Left ventricular ejection fraction, by visual estimation, is 55 to 60%. The left ventricle has normal function. Normal left ventricular size. There is no left ventricular hypertrophy. 2. Left ventricular diastolic Doppler parameters are consistent with pseudonormalization pattern of LV diastolic filling. 3. Global right ventricle has  normal systolic function.The right ventricular size is normal. No increase in right ventricular wall thickness. 4. Left atrial size was mildly dilated. 5. Right atrial size was normal. 6. The mitral valve is grossly normal. Mild mitral valve regurgitation. No evidence of mitral stenosis. 7. The tricuspid valve is grossly normal. Tricuspid valve regurgitation is mild. 8. The aortic valve is abnormal Aortic valve regurgitation was not visualized by color flow Doppler. Mild aortic valve stenosis. 9. The pulmonic valve was normal in structure. Pulmonic valve regurgitation is not visualized by color flow Doppler. 10. Moderately elevated pulmonary artery systolic pressure. __________  Myoview 12/2018:  There was no ST segment deviation noted during stress.  Defect 1: There is a small defect of mild severity present in the apical anterior and apex location. This is likely due to breast attenuation.  The study is normal.  This is a low risk study.  The left ventricular ejection fraction is normal (55-65%).   EKG:  EKG is ordered today.  The EKG ordered today demonstrates sinus bradycardia, 48 bpm, LVH, cannot exclude prior septal infarct, poor R wave progression along the precordial leads, nonspecific ST-T changes slightly improved when compared to prior  Recent Labs: 12/31/2018: B Natriuretic Peptide 357.0; Magnesium 2.2; TSH 2.805 01/01/2019: BUN 20; Creatinine, Ser 1.17; Hemoglobin 12.6; Platelets 220; Potassium 3.4; Sodium 142  Recent Lipid Panel No results found for: CHOL, TRIG, HDL, CHOLHDL, VLDL, LDLCALC, LDLDIRECT  PHYSICAL EXAM:    VS:  BP (!) 150/60 (BP Location: Left Arm, Patient Position: Sitting, Cuff Size: Normal)    Pulse (!) 48    Temp (!) 97.4 F (36.3 C)    Ht  (1.651 m)    Wt 163 lb 12 oz (74.3 kg)    SpO2 98%    BMI 27.25 kg/m   BMI: Body mass index is 27.25 kg/m.  Physical Exam  Constitutional: She is oriented to person, place, and time. She appears  well-developed and well-nourished.  Recheck BP 138/83  HENT:  Head: Normocephalic and atraumatic.  Eyes: Right eye exhibits no discharge. Left eye exhibits no discharge.  Neck: Normal range of motion. No JVD present.  Cardiovascular: Regular rhythm, S1 normal and S2 normal. Bradycardia present. Exam reveals no distant heart sounds, no friction rub, no midsystolic click and no opening snap.  Murmur heard.  Harsh midsystolic murmur is present with a grade of 2/6 at the upper right sternal border radiating to  the neck. Pulses:      Posterior tibial pulses are 2+ on the right side and 2+ on the left side.  Pulmonary/Chest: Effort normal and breath sounds normal. No respiratory distress. She has no decreased breath sounds. She has no wheezes. She has no rales. She exhibits no tenderness.  Abdominal: Soft. She exhibits no distension. There is no abdominal tenderness.  Musculoskeletal:        General: No edema.  Neurological: She is alert and oriented to person, place, and time.  Skin: Skin is warm and dry. No cyanosis. Nails show no clubbing.  Improved rash  Psychiatric: She has a normal mood and affect. Her speech is normal and behavior is normal. Judgment and thought content normal.  Vitals reviewed.   Wt Readings from Last 3 Encounters:  01/31/19 163 lb 12 oz (74.3 kg)  01/15/19 168 lb 12 oz (76.5 kg)  12/31/18 176 lb 8 oz (80.1 kg)     ASSESSMENT & PLAN:   1. PAF: She is maintaining sinus rhythm with a bradycardic heart rate.  Decrease Coreg to 6.25 mg twice daily given bradycardic heart rates.  Rash has nearly resolved following discontinuation of Eliquis.  Remains on renally dosed Xarelto given prior creatinine clearance less than 50 mL/min.  Check CBC and BMP today.  Recent thyroid function normal.  2. HFpEF: She appears euvolemic and well compensated.  Remains on Lasix 20 mg daily.  Check BMP as above.  CHF education.  3. Aortic stenosis: Asymptomatic.  Mild by recent echo.   Follow-up echo in 24 months.  4. Carotid artery disease: Reportedly status post CEA with details being unclear.  Followed by PCP.  On oral anticoagulation in place of aspirin.  Remains on atorvastatin.  Follow-up with PCP as directed.  5. HTN: Blood pressure is improved though remains mildly elevated.  Given we are decreasing her carvedilol secondary to bradycardia as outlined above we will titrate her hydralazine to 100 mg 3 times daily.  She will continue lower dose of Coreg 6.25 mg twice daily, amlodipine 10 mg daily, and lisinopril 40 mg daily.  Continue low-sodium diet.  6. HLD: Patient indicates this is followed by PCP.  Remains on atorvastatin.  7. Rash: Much improved following discontinuation of Eliquis.  If symptoms return recommend she follow-up with PCP.  Disposition: F/u with Dr. Kirke Corin or an APP in 2 months.   Medication Adjustments/Labs and Tests Ordered: Current medicines are reviewed at length with the patient today.  Concerns regarding medicines are outlined above. Medication changes, Labs and Tests ordered today are summarized above and listed in the Patient Instructions accessible in Encounters.   Signed, Eula Listen, PA-C 01/31/2019 12:34 PM     [facility] [place] [address] [place], [place] [address] [phone]

## 2019-01-31 ENCOUNTER — Encounter: Payer: Self-pay | Admitting: Physician Assistant

## 2019-01-31 ENCOUNTER — Ambulatory Visit (INDEPENDENT_AMBULATORY_CARE_PROVIDER_SITE_OTHER): Payer: Medicare Other | Admitting: Physician Assistant

## 2019-01-31 ENCOUNTER — Other Ambulatory Visit: Payer: Self-pay

## 2019-01-31 VITALS — BP 150/60 | HR 48 | Temp 97.4°F | Ht 65.0 in | Wt 163.8 lb

## 2019-01-31 DIAGNOSIS — I480 Paroxysmal atrial fibrillation: Secondary | ICD-10-CM

## 2019-01-31 DIAGNOSIS — I350 Nonrheumatic aortic (valve) stenosis: Secondary | ICD-10-CM

## 2019-01-31 DIAGNOSIS — I779 Disorder of arteries and arterioles, unspecified: Secondary | ICD-10-CM

## 2019-01-31 DIAGNOSIS — I5032 Chronic diastolic (congestive) heart failure: Secondary | ICD-10-CM

## 2019-01-31 DIAGNOSIS — I10 Essential (primary) hypertension: Secondary | ICD-10-CM

## 2019-01-31 DIAGNOSIS — E782 Mixed hyperlipidemia: Secondary | ICD-10-CM | POA: Diagnosis not present

## 2019-01-31 MED ORDER — HYDRALAZINE HCL 100 MG PO TABS: 100.0000 mg | ORAL_TABLET | Freq: Three times a day (TID) | ORAL | 0 refills | Status: DC

## 2019-01-31 MED ORDER — CARVEDILOL 6.25 MG PO TABS: 6.2500 mg | ORAL_TABLET | Freq: Two times a day (BID) | ORAL | 3 refills | Status: DC

## 2019-01-31 NOTE — Patient Instructions (Signed)
Medication Instructions:   1. DECREASE Coreg 6.25MG - Take one tablet by mouth TWICE a day 2. INCREASE Hydralazine 100MG - Take one tablet by mouth THREE times a day  *If you need a refill on your cardiac medications before your next appointment, please call your pharmacy*  Lab Work:  1. Your physician recommends that you return for lab work TODAY: CBC, BMET  If you have labs (blood work) drawn today and your tests are completely normal, you will receive your results only by: Marland Kitchen MyChart Message (if you have MyChart) OR . A paper copy in the mail If you have any lab test that is abnormal or we need to change your treatment, we will call you to review the results.  Testing/Procedures:  1. None Ordered  Follow-Up: At [facility], you and your health needs are our priority.  As part of our continuing mission to provide you with exceptional heart care, we have created designated Provider Care Teams.  These Care Teams include your primary Cardiologist (physician) and Advanced Practice Providers (APPs -  Physician Assistants and Nurse Practitioners) who all work together to provide you with the care you need, when you need it.  Your next appointment:   2 months  The format for your next appointment:   In Person  Provider:  You may see Kathlyn Sacramento, MDor Christell Faith, PA-C

## 2019-02-01 LAB — CBC
Hematocrit: 41.5 % (ref 34.0–46.6)
Hemoglobin: 14 g/dL (ref 11.1–15.9)
MCH: 28.5 pg (ref 26.6–33.0)
MCHC: 33.7 g/dL (ref 31.5–35.7)
MCV: 84 fL (ref 79–97)
Platelets: 271 10*3/uL (ref 150–450)
RBC: 4.92 x10E6/uL (ref 3.77–5.28)
RDW: 13.5 % (ref 11.7–15.4)
WBC: 7.5 10*3/uL (ref 3.4–10.8)

## 2019-02-01 LAB — BASIC METABOLIC PANEL
BUN/Creatinine Ratio: 18 (ref 12–28)
BUN: 25 mg/dL (ref 8–27)
CO2: 23 mmol/L (ref 20–29)
Calcium: 10 mg/dL (ref 8.7–10.3)
Chloride: 105 mmol/L (ref 96–106)
Creatinine, Ser: 1.41 mg/dL — ABNORMAL HIGH (ref 0.57–1.00)
GFR calc Af Amer: 39 mL/min/{1.73_m2} — ABNORMAL LOW (ref >59)
GFR calc non Af Amer: 34 mL/min/{1.73_m2} — ABNORMAL LOW (ref >59)
Glucose: 82 mg/dL (ref 65–99)
Potassium: 4.4 mmol/L (ref 3.5–5.2)
Sodium: 144 mmol/L (ref 134–144)

## 2019-02-05 ENCOUNTER — Telehealth: Payer: Self-pay | Admitting: Cardiovascular Disease

## 2019-02-05 MED ORDER — ISOSORBIDE MONONITRATE ER 30 MG PO TB24: 30.0000 mg | ORAL_TABLET | Freq: Every day | ORAL | 1 refills | Status: DC

## 2019-02-05 NOTE — Telephone Encounter (Signed)
Spoke with the patient and her daughter Joan Willis. Patient is agreeable with starting Imdur 30mg  daily as recommended. Rx sent to the patient's pharmacy.  Update added to the previous message. Patients HR's have been in the upper 50's. Esmond Camper to continue to monitor the patient's BP and HR. Esmond Camper to contact the office if the patient's BP is consistently elevated. Joan Willis verbalized understanding and voiced appreciation for the call.

## 2019-02-05 NOTE — Telephone Encounter (Signed)
Pt c/o BP issue: STAT if pt c/o blurred vision, one-sided weakness or slurred speech  1. What are your last 5 BP readings?  10/25 - 166/64 10/26 - 178/62  2. Are you having any other symptoms (ex. Dizziness, headache, blurred vision, passed out)? No other symptoms   3. What is your BP issue? Patient family member calling.  States there were medication changes made at last OV with R Dunn on 10/21 but they are still having trouble with high BP.  Please call to discuss.

## 2019-02-05 NOTE — Telephone Encounter (Signed)
Update fwd to Christell Faith, PA to review and advise.

## 2019-02-05 NOTE — Telephone Encounter (Signed)
At patient's most recent visit she reported BP readings mostly in the 144R systolic.  She was bradycardic with a heart rate of 48 bpm and reported heart rates in the upper 40s to 50s bpm at home.  In this setting, her Coreg was tapered to 6.25 mg twice daily.  To compensate for this with regards to her hypertension, her hydralazine was titrated to 100 mg 3 times daily.  She was continued on amlodipine 10 mg daily, Lasix 20 mg daily, and lisinopril 40 mg daily.  Patient's family member notes BP running in the 154M to 086P systolic on phone call today.  Recommendations: -Add Imdur 30 mg daily -Continue current doses of amlodipine, carvedilol (heart rates not provided), lisinopril, Lasix, and hydralazine -Continue low-sodium diet

## 2019-03-27 ENCOUNTER — Ambulatory Visit: Payer: Medicare Other | Admitting: Cardiovascular Disease

## 2019-04-16 NOTE — Progress Notes (Signed)
Virtual Visit via Telephone Note   This visit type was conducted due to national recommendations for restrictions regarding the COVID-19 Pandemic (e.g. social distancing) in an effort to limit this patient's exposure and mitigate transmission in our community.  Due to her co-morbid illnesses, this patient is at least at moderate risk for complications without adequate follow up.  This format is felt to be most appropriate for this patient at this time.  The patient did not have access to video technology/had technical difficulties with video requiring transitioning to audio format only (telephone).  All issues noted in this document were discussed and addressed.  No physical exam could be performed with this format.  Please refer to the patient's chart for her  consent to telehealth for [facility].   Date:  04/20/2019   ID:  Joan Willis, DOB 01-19-1930, MRN [id number]  Patient Location: Home Provider Location: Home  PCP:  Marguerita Merles, MD  Cardiologist:  Kathlyn Sacramento, MD  Electrophysiologist:  None   Evaluation Performed:  Follow-Up Visit  Chief Complaint:  Follow up of HTN, atrial fibrillation  History of Present Illness:    Joan Willis is a 86 y.o. female with history of atrial fibrillation on Xarelto (possible rest of Eliquis), HFpEF, mild AS, carotid artery disease s/p carotid endarterectomy with unclear details, HTN, HLD, hypothyroidism, asthma.  Admitted 07/2017 with acute on chronic diastolic heart failure, bronchitis, poorly controlled BP.  Echo 07/2017 EF 60 to 65%, moderate LVH, GR 2 DD, aortic sclerosis without stenosis, mild AI, mildly dilated LA, normal RV systolic function, mildly elevated PASP 35 to 40 mmHg.  Admitted 12/2018 substernal chest pressure and SOP.  Noted to be in A. fib with RVR.  High-sensitivity troponin 11 with delta 163.  Echo while admitted LVEF 55 to 78%, no LVH, diastolic dysfunction, normal RV systolic function, mildly dilated LA, mild MR, mild  TR, mild left ear, moderately elevated PASP.  Lexiscan Myoview small defect of mild severity and apical anterior and apex location likely breast attenuation, EF 55 to 65%, lowest risk study.  Seen hospital follow-up 01/15/2019 noted rash likely due to Eliquis and she was changed to renally dose Xarelto.  BP medications were uptitrated including addition of Imdur 30 mg daily on 02/05/2019.Marland Kitchen  Coreg was down titrated due to bradycardia.  Joan Willis reports feeling well.  She has been very busy during the course of the pandemic helping her grandchildren with their online school throughout the day.  She reports she stays very active doing things around the house.  She endorses eating a low-sodium, heart healthy diet.  She checks her blood pressure about once per week at home and reports the top number is normally 140.  She denies lightheadedness, dizziness, chest pain.  She reports no chest pain, pressure, tightness.  She denies dyspnea nor shortness of breath.  She has no concerns today.  Labs independently reviewed: 01/2019 - Hgb 14.0, PLT 271, potassium 4.4, BUN 25, serum creatinine 1.41 12/2018-A1c 5.6, TSH normal, magnesium 2.2  The patient does not have symptoms concerning for COVID-19 infection (fever, chills, cough, or new shortness of breath).    Past Medical History:  Diagnosis Date  . (HFpEF) heart failure with preserved ejection fraction ([place])    a. TTE 9/20 - EF 55 to 29%, no LVH, diastolic dysfunction, normal RV systolic function, mildly dilated left atrium, mild mitral regurgitation, mild tricuspid regurgitation, mild aortic stenosis, moderately elevated PASP  . Asthma   . Carotid artery  disease ([facility])   . Hyperlipemia   . Hypertension   . Hypothyroid   . PAF (paroxysmal atrial fibrillation) ([facility])    a.  Diagnosed 9/20; b. CHADS2VASc 6 (CHF, HTN, age x 2, vascular disease, female); c. on Eliquis   Past Surgical History:  Procedure Laterality Date  . ABDOMINAL HYSTERECTOMY    .  APPENDECTOMY    . arm surgery    . cartotid endaractomy       Current Meds  Medication Sig  . albuterol (PROVENTIL HFA;VENTOLIN HFA) 108 (90 Base) MCG/ACT inhaler Inhale 2 puffs into the lungs every 6 (six) hours as needed for wheezing or shortness of breath.  Marland Kitchen albuterol (PROVENTIL) (2.5 MG/3ML) 0.083% nebulizer solution Inhale 2.5 mLs into the lungs every 4 (four) hours as needed for wheezing or shortness of breath.   Marland Kitchen amLODipine (NORVASC) 10 MG tablet Take 10 mg by mouth daily.  Marland Kitchen atorvastatin (LIPITOR) 40 MG tablet Take 40 mg by mouth daily.  . carvedilol (COREG) 6.25 MG tablet Take 1 tablet (6.25 mg total) by mouth 2 (two) times daily.  Marland Kitchen FLOVENT HFA 220 MCG/ACT inhaler Inhale 2 puffs into the lungs daily.  . furosemide (LASIX) 20 MG tablet Take 20 mg by mouth daily.  . hydrALAZINE (APRESOLINE) 100 MG tablet Take 1 tablet (100 mg total) by mouth 3 (three) times daily.  . isosorbide mononitrate (IMDUR) 30 MG 24 hr tablet Take 1 tablet (30 mg total) by mouth daily.  Marland Kitchen levothyroxine (SYNTHROID, LEVOTHROID) 100 MCG tablet Take 100 mcg by mouth daily.  Marland Kitchen lisinopril (PRINIVIL,ZESTRIL) 40 MG tablet Take 40 mg by mouth daily.  . montelukast (SINGULAIR) 10 MG tablet Take 10 mg by mouth daily.  . Rivaroxaban (XARELTO) 15 MG TABS tablet Take 1 tablet (15 mg total) by mouth daily with supper.  . [DISCONTINUED] carvedilol (COREG) 6.25 MG tablet Take 1 tablet (6.25 mg total) by mouth 2 (two) times daily.     Allergies:   Patient has no known allergies.   Social History   Tobacco Use  . Smoking status: Never Smoker  . Smokeless tobacco: Never Used  . Tobacco comment: pt states she has never smoked  Substance Use Topics  . Alcohol use: Never  . Drug use: Never     Family Hx: The patient's family history includes Hypertension in her mother.  ROS:   Please see the history of present illness.     All other systems reviewed and are negative.   Prior CV studies:   The following studies  were reviewed today:  2D Echo 12/2018: 1. Left ventricular ejection fraction, by visual estimation, is 55 to 60%. The left ventricle has normal function. Normal left ventricular size. There is no left ventricular hypertrophy. 2. Left ventricular diastolic Doppler parameters are consistent with pseudonormalization pattern of LV diastolic filling. 3. Global right ventricle has normal systolic function.The right ventricular size is normal. No increase in right ventricular wall thickness. 4. Left atrial size was mildly dilated. 5. Right atrial size was normal. 6. The mitral valve is grossly normal. Mild mitral valve regurgitation. No evidence of mitral stenosis. 7. The tricuspid valve is grossly normal. Tricuspid valve regurgitation is mild. 8. The aortic valve is abnormal Aortic valve regurgitation was not visualized by color flow Doppler. Mild aortic valve stenosis. 9. The pulmonic valve was normal in structure. Pulmonic valve regurgitation is not visualized by color flow Doppler. 10. Moderately elevated pulmonary artery systolic pressure. __________  Myoview 12/2018:  There was no ST  segment deviation noted during stress.  Defect 1: There is a small defect of mild severity present in the apical anterior and apex location. This is likely due to breast attenuation.  The study is normal.  This is a low risk study.  The left ventricular ejection fraction is normal (55-65%).   Labs/Other Tests and Data Reviewed:    EKG:  No ECG reviewed.  Recent Labs: 12/31/2018: B Natriuretic Peptide 357.0; Magnesium 2.2; TSH 2.805 01/31/2019: BUN 25; Creatinine, Ser 1.41; Hemoglobin 14.0; Platelets 271; Potassium 4.4; Sodium 144   Recent Lipid Panel No results found for: CHOL, TRIG, HDL, CHOLHDL, LDLCALC, LDLDIRECT  Wt Readings from Last 3 Encounters:  04/20/19 163 lb (73.9 kg)  01/31/19 163 lb 12 oz (74.3 kg)  01/15/19 168 lb 12 oz (76.5 kg)     Objective:    Vital Signs:  BP (!)  149/60   Pulse (!) 58   Ht 5\' 5"  (1.651 m)   Wt 163 lb (73.9 kg)   BMI 27.12 kg/m    VITAL SIGNS:  reviewed  ASSESSMENT & PLAN:    1. PAF -denies recurrence.  Currently maintained on Coreg, Xarelto. 2. Chronic anticoagulation -secondary to PAF.  Intolerant of Eliquis with rash, remains on renally dosed Xarelto.  Denies bleeding complications. 3. HFpEF - She denies DOE, edema.  Continue furosemide 20 mg daily.  Continue low-sodium, heart healthy diet. 4. Mild aortic stenosis -noted by echo 12/2018.  Asymptomatic.  Recommend for repeat echo 12/2019. 5. Carotid artery disease -reportedly s/p CEA with details being clear.  On anticoagulation in place of aspirin.  Continue Lipitor.   6. HTN - Present regimen includes amlodipine 10 mg, Coreg 6.25 mg, Lasix 20 mg, hydralazine 100 mg 3 times daily, Imdur 30 mg, lisinopril 40 mg.  BP has improved after addition of Imdur 01/2019.  Hesitant to further increase her Imdur without knowing the accuracy of her BP cuff and she only checks once per week.  Careful to avoid hypotension for risk of falls.  Could consider increased dose of Imdur at future date.  In the interim, continue present regimen. 7. HLD - Follows with her PCP. Continue Atorvastatin 40mg .   COVID-19 Education: The signs and symptoms of COVID-19 were discussed with the patient and how to seek care for testing (follow up with PCP or arrange E-visit).  The importance of social distancing was discussed today.  Time:   Today, I have spent 10 minutes with the patient with telehealth technology discussing the above problems.     Medication Adjustments/Labs and Tests Ordered: Current medicines are reviewed at length with the patient today.  Concerns regarding medicines are outlined above.   Tests Ordered: No orders of the defined types were placed in this encounter.   Medication Changes: Meds ordered this encounter  Medications  . carvedilol (COREG) 6.25 MG tablet    Sig: Take 1 tablet  (6.25 mg total) by mouth 2 (two) times daily.    Dispense:  180 tablet    Refill:  3    Order Specific Question:   Supervising Provider    Answer:   02/2019    Follow Up:  In Person in 3 month(s) with Dr. Baldo Daub.  Signed, [[id number]], NP  04/20/2019 12:37 PM    [facility]

## 2019-04-18 ENCOUNTER — Telehealth: Payer: Self-pay | Admitting: Cardiovascular Disease

## 2019-04-18 NOTE — Telephone Encounter (Signed)

## 2019-04-20 ENCOUNTER — Other Ambulatory Visit: Payer: Self-pay

## 2019-04-20 ENCOUNTER — Encounter: Payer: Self-pay | Admitting: Family

## 2019-04-20 ENCOUNTER — Telehealth (INDEPENDENT_AMBULATORY_CARE_PROVIDER_SITE_OTHER): Payer: Medicare Other | Admitting: Family

## 2019-04-20 VITALS — BP 149/60 | HR 58 | Ht 65.0 in | Wt 163.0 lb

## 2019-04-20 DIAGNOSIS — I480 Paroxysmal atrial fibrillation: Secondary | ICD-10-CM | POA: Diagnosis not present

## 2019-04-20 DIAGNOSIS — I350 Nonrheumatic aortic (valve) stenosis: Secondary | ICD-10-CM | POA: Diagnosis not present

## 2019-04-20 DIAGNOSIS — I5032 Chronic diastolic (congestive) heart failure: Secondary | ICD-10-CM

## 2019-04-20 DIAGNOSIS — I10 Essential (primary) hypertension: Secondary | ICD-10-CM

## 2019-04-20 DIAGNOSIS — E782 Mixed hyperlipidemia: Secondary | ICD-10-CM

## 2019-04-20 DIAGNOSIS — Z7901 Long term (current) use of anticoagulants: Secondary | ICD-10-CM

## 2019-04-20 MED ORDER — CARVEDILOL 6.25 MG PO TABS: 6.2500 mg | ORAL_TABLET | Freq: Two times a day (BID) | ORAL | 3 refills | Status: DC

## 2019-04-20 NOTE — Patient Instructions (Signed)
Medication Instructions:   A refill of your Carvedilol was sent to your pharmacy per your request. No medication changes today.   *If you need a refill on your cardiac medications before your next appointment, please call your pharmacy*  Lab Work: None ordered today.  Testing/Procedures: None ordered today.   Follow-Up: At [facility], you and your health needs are our priority.  As part of our continuing mission to provide you with exceptional heart care, we have created designated Provider Care Teams.  These Care Teams include your primary Cardiologist (physician) and Advanced Practice Providers (APPs -  Physician Assistants and Nurse Practitioners) who all work together to provide you with the care you need, when you need it.  Your next appointment:   3 month(s)  The format for your next appointment:   In Person  Provider:   Lorine Bears, MD  Other Instructions  It was a pleasure speaking with you today! Glad you are doing so well. Keep checking your blood pressure 1-2 times per week. Please bring your blood pressure cuff to your next office visit so we can check it for accuracy. If you have any questions or concerns, don't hesitate to call us.

## 2019-04-24 ENCOUNTER — Other Ambulatory Visit: Payer: Self-pay | Admitting: Physician Assistant

## 2019-07-19 ENCOUNTER — Encounter: Payer: Self-pay | Admitting: Cardiovascular Disease

## 2019-07-19 ENCOUNTER — Other Ambulatory Visit: Payer: Self-pay

## 2019-07-19 ENCOUNTER — Ambulatory Visit (INDEPENDENT_AMBULATORY_CARE_PROVIDER_SITE_OTHER): Payer: Medicare Other | Admitting: Cardiovascular Disease

## 2019-07-19 VITALS — BP 170/70 | HR 56 | Ht 65.0 in | Wt 163.4 lb

## 2019-07-19 DIAGNOSIS — I5032 Chronic diastolic (congestive) heart failure: Secondary | ICD-10-CM

## 2019-07-19 DIAGNOSIS — I10 Essential (primary) hypertension: Secondary | ICD-10-CM | POA: Diagnosis not present

## 2019-07-19 DIAGNOSIS — I480 Paroxysmal atrial fibrillation: Secondary | ICD-10-CM | POA: Diagnosis not present

## 2019-07-19 NOTE — Patient Instructions (Signed)
Medication Instructions:  Your physician recommends that you continue on your current medications as directed. Please refer to the Current Medication list given to you today.  *If you need a refill on your cardiac medications before your next appointment, please call your pharmacy*   Lab Work: None ordered If you have labs (blood work) drawn today and your tests are completely normal, you will receive your results only by: Marland Kitchen MyChart Message (if you have MyChart) OR . A paper copy in the mail If you have any lab test that is abnormal or we need to change your treatment, we will call you to review the results.   Testing/Procedures: Your physician has requested that you have a renal artery duplex. During this test, an ultrasound is used to evaluate blood flow to the kidneys. Allow one hour for this exam. Do not eat after midnight the day before and avoid carbonated beverages. Take your medications as you usually do.     Follow-Up: At [facility], you and your health needs are our priority.  As part of our continuing mission to provide you with exceptional heart care, we have created designated Provider Care Teams.  These Care Teams include your primary Cardiologist (physician) and Advanced Practice Providers (APPs -  Physician Assistants and Nurse Practitioners) who all work together to provide you with the care you need, when you need it.  We recommend signing up for the patient portal called "MyChart".  Sign up information is provided on this After Visit Summary.  MyChart is used to connect with patients for Virtual Visits (Telemedicine).  Patients are able to view lab/test results, encounter notes, upcoming appointments, etc.  Non-urgent messages can be sent to your provider as well.   To learn more about what you can do with MyChart, go to [email or web address].    Your next appointment:   6 month(s)  The format for your next appointment:   In Person  Provider:    You may see  Lorine Bears, MD or one of the following Advanced Practice Providers on your designated Care Team:    Nicolasa Ducking, NP  Eula Listen, PA-C  Marisue Ivan, PA-C    Other Instructions N/A

## 2019-07-19 NOTE — Progress Notes (Signed)
Cardiology Office Note   Date:  07/19/2019   ID:  Joan Willis, DOB 20-May-1929, MRN [id number]  PCP:  Marguerita Merles, MD  Cardiologist:   Kathlyn Sacramento, MD   Chief Complaint  Patient presents with  . office visit    3 month F/U-No new concerns; Meds verbally reviewed with patient.      History of Present Illness: Joan Willis is a 86 y.o. female who presents for a follow-up visit regarding atrial fibrillation.  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 She was hospitalized in April 2019 with acute on chronic diastolic heart failure in the setting of bronchitis and poorly controlled blood pressure.  Echocardiogram showed normal LV systolic function with moderate LVH and grade 2 diastolic dysfunction, mild pulmonary hypertension and aortic sclerosis. She was hospitalized in September 2020 with chest pain and shortness of breath and was noted to be in atrial fibrillation with rapid ventricular response.  Lexiscan Myoview showed small defect of mild severity in the apical and anterior location likely due to breast attenuation and overall was a low risk study.  She was seen in October with a rash thought to be due to Eliquis.  She was switched to Xarelto. She continued to have issues with uncontrolled hypertension.  Carvedilol could not be increased due to bradycardia. She has been doing reasonably well with no recent chest pain, shortness of breath or palpitations.  She takes her medications regularly and follow-up low-sodium diet.  In spite of that, her blood pressure continues to be elevated.    Past Medical History:  Diagnosis Date  . (HFpEF) heart failure with preserved ejection fraction ([place])    a. TTE 9/20 - EF 55 to 78%, no LVH, diastolic dysfunction, normal RV  systolic function, mildly dilated left atrium, mild mitral regurgitation, mild tricuspid regurgitation, mild aortic stenosis, moderately elevated PASP  . Asthma   . Carotid artery disease ([place])   . Hyperlipemia   . Hypertension   . Hypothyroid   . PAF (paroxysmal atrial fibrillation) ([place])    a.  Diagnosed 9/20; b. CHADS2VASc 6 (CHF, HTN, age x 2, vascular disease, female); c. on Eliquis    Past Surgical History:  Procedure Laterality Date  . ABDOMINAL HYSTERECTOMY    . APPENDECTOMY    . arm surgery    . cartotid endaractomy       Current Outpatient Medications  Medication Sig Dispense Refill  . albuterol (PROVENTIL HFA;VENTOLIN HFA) 108 (90 Base) MCG/ACT inhaler Inhale 2 puffs into the lungs every 6 (six) hours as needed for wheezing or shortness of breath. 1 Inhaler 3  . albuterol (PROVENTIL) (2.5 MG/3ML) 0.083% nebulizer solution Inhale 2.5 mLs into the lungs every 4 (four) hours as needed for wheezing or shortness of breath.   3  . amLODipine (NORVASC) 10 MG tablet Take 10 mg by mouth daily.  4  . atorvastatin (LIPITOR) 40 MG tablet Take 40 mg by mouth daily.  3  . carvedilol (COREG) 6.25 MG tablet Take 1 tablet (6.25 mg total) by mouth 2 (two) times daily. 180 tablet 3  . FLOVENT HFA 220 MCG/ACT inhaler Inhale 2 puffs into the lungs daily. 1 Inhaler 10  . furosemide (LASIX) 20 MG tablet Take 20 mg by mouth daily.  4  . hydrALAZINE (APRESOLINE) 100 MG tablet TAKE 1 TABLET (100  MG TOTAL) BY MOUTH 3 (THREE) TIMES DAILY. 270 tablet 0  . levothyroxine (SYNTHROID, LEVOTHROID) 100 MCG tablet Take 100 mcg by mouth daily.  4  . lisinopril (PRINIVIL,ZESTRIL) 40 MG tablet Take 40 mg by mouth daily.  1  . montelukast (SINGULAIR) 10 MG tablet Take 10 mg by mouth daily.  4  . Rivaroxaban (XARELTO) 15 MG TABS tablet Take 1 tablet (15 mg total) by mouth daily with supper. 90 tablet 3  . isosorbide mononitrate (IMDUR) 30 MG 24 hr tablet Take 1 tablet (30 mg total) by mouth daily. 90 tablet 1    No current facility-administered medications for this visit.    Allergies:   Patient has no known allergies.    Social History:  The patient  reports that she has never smoked. She has never used smokeless tobacco. She reports that she does not drink alcohol or use drugs.   Family History:  The patient's family history includes Hypertension in her mother.    ROS:  Please see the history of present illness.   Otherwise, review of systems are positive for none.   All other systems are reviewed and negative.    PHYSICAL EXAM: VS:  BP (!) 170/70 (BP Location: Left Arm, Patient Position: Sitting, Cuff Size: Normal)   Pulse (!) 56   Ht 5\' 5"  (1.651 m)   Wt 163 lb 6 oz (74.1 kg)   SpO2 93%   BMI 27.19 kg/m  , BMI Body mass index is 27.19 kg/m. GEN: Well nourished, well developed, in no acute distress  HEENT: normal  Neck: no JVD, carotid bruits, or masses Cardiac: RRR; no  rubs, or gallops,no edema .  2 out of 6 systolic murmur in the aortic area Respiratory:  clear to auscultation bilaterally, normal work of breathing GI: soft, nontender, nondistended, + BS MS: no deformity or atrophy  Skin: warm and dry, no rash Neuro:  Strength and sensation are intact Psych: euthymic mood, full affect   EKG:  EKG is ordered today. The ekg ordered today demonstrates sinus bradycardia with left axis deviation, LVH with repolarization abnormalities.   Recent Labs: 12/31/2018: B Natriuretic Peptide 357.0; Magnesium 2.2; TSH 2.805 01/31/2019: BUN 25; Creatinine, Ser 1.41; Hemoglobin 14.0; Platelets 271; Potassium 4.4; Sodium 144    Lipid Panel No results found for: CHOL, TRIG, HDL, CHOLHDL, VLDL, LDLCALC, LDLDIRECT    Wt Readings from Last 3 Encounters:  07/19/19 163 lb 6 oz (74.1 kg)  04/20/19 163 lb (73.9 kg)  01/31/19 163 lb 12 oz (74.3 kg)       No flowsheet data found.    ASSESSMENT AND PLAN:  1.  Paroxysmal atrial fibrillation: She is maintaining in sinus rhythm and  tolerating anticoagulation with Xarelto.  2.  Chronic diastolic heart failure: she appears to be euvolemic on current dose of furosemide.  3.  Mild aortic stenosis: Stable heart murmur.  Monitor with serial echocardiograms.  4.  Carotid disease status post carotid endarterectomy.  Stable  5.  Essential hypertension: Blood pressure continues to be uncontrolled in spite of 5 antihypertensive medications.  The patient has history of atrophied congenital kidney and has 1 functioning kidney.  She is at high risk for renal artery stenosis which might be causing some of her uncontrolled hypertension.  I requested renal artery duplex.  She also has underlying chronic kidney disease with most recent creatinine around 1.3 corresponding to a GFR of 38.  This as well might be contributing.  She has no symptoms suggestive  of sleep apnea.  6.  Hyperlipidemia: Currently on atorvastatin.    Disposition:   FU with me in 6 months  Signed,  Lorine Bears, MD  07/19/2019 10:04 AM    [facility]

## 2019-07-27 ENCOUNTER — Other Ambulatory Visit: Payer: Self-pay | Admitting: Physician Assistant

## 2019-08-28 ENCOUNTER — Encounter: Payer: Self-pay | Admitting: Emergency Medicine

## 2019-08-28 ENCOUNTER — Other Ambulatory Visit: Payer: Self-pay

## 2019-08-28 ENCOUNTER — Inpatient Hospital Stay
Admission: EM | Admit: 2019-08-28 | Discharge: 2019-08-30 | DRG: 309 | Disposition: A | Discharge status: Home / Self Care | Payer: Medicare Other | Attending: Internal Medicine | Admitting: Internal Medicine

## 2019-08-28 ENCOUNTER — Emergency Department: Payer: Medicare Other

## 2019-08-28 DIAGNOSIS — Z8249 Family history of ischemic heart disease and other diseases of the circulatory system: Secondary | ICD-10-CM

## 2019-08-28 DIAGNOSIS — I350 Nonrheumatic aortic (valve) stenosis: Secondary | ICD-10-CM | POA: Diagnosis present

## 2019-08-28 DIAGNOSIS — I480 Paroxysmal atrial fibrillation: Principal | ICD-10-CM

## 2019-08-28 DIAGNOSIS — I130 Hypertensive heart and chronic kidney disease with heart failure and stage 1 through stage 4 chronic kidney disease, or unspecified chronic kidney disease: Secondary | ICD-10-CM | POA: Diagnosis present

## 2019-08-28 DIAGNOSIS — Z79890 Hormone replacement therapy (postmenopausal): Secondary | ICD-10-CM

## 2019-08-28 DIAGNOSIS — J449 Chronic obstructive pulmonary disease, unspecified: Secondary | ICD-10-CM | POA: Diagnosis present

## 2019-08-28 DIAGNOSIS — R7989 Other specified abnormal findings of blood chemistry: Secondary | ICD-10-CM

## 2019-08-28 DIAGNOSIS — I4891 Unspecified atrial fibrillation: Secondary | ICD-10-CM | POA: Diagnosis present

## 2019-08-28 DIAGNOSIS — E785 Hyperlipidemia, unspecified: Secondary | ICD-10-CM | POA: Diagnosis present

## 2019-08-28 DIAGNOSIS — J4489 Other specified chronic obstructive pulmonary disease: Secondary | ICD-10-CM

## 2019-08-28 DIAGNOSIS — D631 Anemia in chronic kidney disease: Secondary | ICD-10-CM | POA: Diagnosis present

## 2019-08-28 DIAGNOSIS — I10 Essential (primary) hypertension: Secondary | ICD-10-CM | POA: Diagnosis present

## 2019-08-28 DIAGNOSIS — N179 Acute kidney failure, unspecified: Secondary | ICD-10-CM | POA: Diagnosis present

## 2019-08-28 DIAGNOSIS — E039 Hypothyroidism, unspecified: Secondary | ICD-10-CM | POA: Diagnosis present

## 2019-08-28 DIAGNOSIS — E876 Hypokalemia: Secondary | ICD-10-CM | POA: Diagnosis present

## 2019-08-28 DIAGNOSIS — Z90710 Acquired absence of both cervix and uterus: Secondary | ICD-10-CM

## 2019-08-28 DIAGNOSIS — R079 Chest pain, unspecified: Secondary | ICD-10-CM | POA: Diagnosis present

## 2019-08-28 DIAGNOSIS — Z79899 Other long term (current) drug therapy: Secondary | ICD-10-CM

## 2019-08-28 DIAGNOSIS — R778 Other specified abnormalities of plasma proteins: Secondary | ICD-10-CM

## 2019-08-28 DIAGNOSIS — Z20822 Contact with and (suspected) exposure to covid-19: Secondary | ICD-10-CM | POA: Diagnosis present

## 2019-08-28 DIAGNOSIS — R072 Precordial pain: Secondary | ICD-10-CM

## 2019-08-28 DIAGNOSIS — I5032 Chronic diastolic (congestive) heart failure: Secondary | ICD-10-CM

## 2019-08-28 DIAGNOSIS — Z7901 Long term (current) use of anticoagulants: Secondary | ICD-10-CM

## 2019-08-28 DIAGNOSIS — N183 Chronic kidney disease, stage 3 unspecified: Secondary | ICD-10-CM | POA: Diagnosis present

## 2019-08-28 LAB — CBC WITH DIFFERENTIAL/PLATELET
Abs Immature Granulocytes: 0.03 10*3/uL (ref 0.00–0.07)
Basophils Absolute: 0.1 10*3/uL (ref 0.0–0.1)
Basophils Relative: 1 %
Eosinophils Absolute: 0.2 10*3/uL (ref 0.0–0.5)
Eosinophils Relative: 2 %
HCT: 40.6 % (ref 36.0–46.0)
Hemoglobin: 13.1 g/dL (ref 12.0–15.0)
Immature Granulocytes: 0 %
Lymphocytes Relative: 22 %
Lymphs Abs: 2.1 10*3/uL (ref 0.7–4.0)
MCH: 28.8 pg (ref 26.0–34.0)
MCHC: 32.3 g/dL (ref 30.0–36.0)
MCV: 89.2 fL (ref 80.0–100.0)
Monocytes Absolute: 0.6 10*3/uL (ref 0.1–1.0)
Monocytes Relative: 6 %
Neutro Abs: 6.7 10*3/uL (ref 1.7–7.7)
Neutrophils Relative %: 69 %
Platelets: 269 10*3/uL (ref 150–400)
RBC: 4.55 MIL/uL (ref 3.87–5.11)
RDW: 14.1 % (ref 11.5–15.5)
WBC: 9.7 10*3/uL (ref 4.0–10.5)
nRBC: 0 % (ref 0.0–0.2)

## 2019-08-28 MED ORDER — METOPROLOL TARTRATE 5 MG/5ML IV SOLN
5.0000 mg | Freq: Once | INTRAVENOUS | Status: AC
  Administered 2019-08-29: 5 mg via INTRAVENOUS
  Filled 2019-08-28: qty 5

## 2019-08-28 NOTE — ED Triage Notes (Signed)
Pt to triage via w/c with no distress noted, mask in place; pt reports heart racing and "chest tightness"; denies pain; st hx of same but doesn't know why

## 2019-08-28 NOTE — ED Provider Notes (Signed)
[facility] Emergency Department Provider Note  Time seen: 11:57 PM  I have reviewed the triage vital signs and the nursing notes.   HISTORY  Chief Complaint Tachycardia   HPI Joan Willis is a 86 y.o. female with a past medical history of CHF, hypertension, hyperlipidemia, paroxysmal atrial fibrillation on Xarelto presents to the emergency department for chest tightness and tachycardia.  According to the patient this evening she began feeling like her heart was racing.  Patient states she began feeling some tightness in her chest that she came to the emergency department for evaluation.  Denies any chest "pain."  Denies any fever or shortness of breath.  Largely negative review of systems otherwise.  Patient states similar symptoms back in September but none since.   Past Medical History:  Diagnosis Date  . (HFpEF) heart failure with preserved ejection fraction ([place])    a. TTE 9/20 - EF 55 to 29%, no LVH, diastolic dysfunction, normal RV systolic function, mildly dilated left atrium, mild mitral regurgitation, mild tricuspid regurgitation, mild aortic stenosis, moderately elevated PASP  . Asthma   . Carotid artery disease ([place])   . Hyperlipemia   . Hypertension   . Hypothyroid   . PAF (paroxysmal atrial fibrillation) ([place])    a.  Diagnosed 9/20; b. CHADS2VASc 6 (CHF, HTN, age x 2, vascular disease, female); c. on Eliquis    Patient Active Problem List   Diagnosis Date Noted  . Atrial fibrillation with RVR ([place]) 12/31/2018  . HTN (hypertension) 12/31/2018  . HLD (hyperlipidemia) 12/31/2018  . Hypothyroidism 12/31/2018  . Asthma flare 09/15/2017  . Bronchitis 07/12/2017    Past Surgical History:  Procedure Laterality Date  . ABDOMINAL HYSTERECTOMY    . APPENDECTOMY    . arm surgery    . cartotid endaractomy      Prior to Admission medications   Medication Sig Start Date End Date Taking? Authorizing Provider  albuterol (PROVENTIL HFA;VENTOLIN  HFA) 108 (90 Base) MCG/ACT inhaler Inhale 2 puffs into the lungs every 6 (six) hours as needed for wheezing or shortness of breath. 10/11/17   Wilhelmina Mcardle, MD  albuterol (PROVENTIL) (2.5 MG/3ML) 0.083% nebulizer solution Inhale 2.5 mLs into the lungs every 4 (four) hours as needed for wheezing or shortness of breath.  09/13/17   [provider]  amLODipine (NORVASC) 10 MG tablet Take 10 mg by mouth daily. 05/04/17   [provider]  atorvastatin (LIPITOR) 40 MG tablet Take 40 mg by mouth daily. 04/18/17   [provider]  carvedilol (COREG) 6.25 MG tablet Take 1 tablet (6.25 mg total) by mouth 2 (two) times daily. 04/20/19   Loel Dubonnet, NP  FLOVENT HFA 220 MCG/ACT inhaler Inhale 2 puffs into the lungs daily. 01/17/18   Wilhelmina Mcardle, MD  furosemide (LASIX) 20 MG tablet Take 20 mg by mouth daily. 04/29/17   [provider]  hydrALAZINE (APRESOLINE) 100 MG tablet TAKE 1 TABLET (100 MG TOTAL) BY MOUTH 3 (THREE) TIMES DAILY. 04/24/19   Dunn, Areta Haber, PA-C  isosorbide mononitrate (IMDUR) 30 MG 24 hr tablet TAKE 1 TABLET BY MOUTH EVERY DAY 07/30/19   Rise Mu, PA-C  levothyroxine (SYNTHROID, LEVOTHROID) 100 MCG tablet Take 100 mcg by mouth daily. 04/29/17   [provider]  lisinopril (PRINIVIL,ZESTRIL) 40 MG tablet Take 40 mg by mouth daily. 06/28/17   [provider]  montelukast (SINGULAIR) 10 MG tablet Take 10 mg by mouth daily. 07/19/17   [provider]  Rivaroxaban (XARELTO) 15 MG TABS tablet Take 1 tablet (15 mg total) by mouth daily with supper. 01/15/19   Sondra Barges, PA-C    No Known Allergies  Family History  Problem Relation Age of Onset  . Hypertension Mother     Social History Social History   Tobacco Use  . Smoking status: Never Smoker  . Smokeless tobacco: Never Used  . Tobacco comment: pt states she has never smoked  Substance Use Topics  . Alcohol use: Never  . Drug use: Never    Review of  Systems Constitutional: Negative for fever Cardiovascular: Mild chest tightness, now resolved.  Positive for palpitations/heart racing. Respiratory: Negative for shortness of breath. Gastrointestinal: Negative for abdominal pain, vomiting  Musculoskeletal: Negative for musculoskeletal complaints Neurological: Negative for headache All other ROS negative  ____________________________________________   PHYSICAL EXAM:  VITAL SIGNS: ED Triage Vitals  Enc Vitals Group     BP 08/28/19 2340 (!) 159/91     Pulse Rate 08/28/19 2340 (!) 116     Resp 08/28/19 2340 18     Temp 08/28/19 2340 97.6 F (36.4 C)     Temp Source 08/28/19 2340 Oral     SpO2 08/28/19 2340 97 %     Weight 08/28/19 2336 163 lb (73.9 kg)     Height 08/28/19 2336 5\' 5"  (1.651 m)     Head Circumference --      Peak Flow --      Pain Score 08/28/19 2336 0     Pain Loc --      Pain Edu? --      Excl. in GC? --     Constitutional: Alert and oriented. Well appearing and in no distress. Eyes: Normal exam ENT      Head: Normocephalic and atraumatic.      Mouth/Throat: Mucous membranes are moist. Cardiovascular: Irregular rhythm rate around 120.   Respiratory: Normal respiratory effort without tachypnea nor retractions. Breath sounds are clear, without wheeze rales or rhonchi Gastrointestinal: Soft and nontender. No distention Musculoskeletal: Nontender with normal range of motion in all extremities. No lower extremity tenderness or edema. Neurologic:  Normal speech and language. No gross focal neurologic deficits Skin:  Skin is warm, dry and intact.   Psychiatric: Mood and affect are normal. Speech and behavior are normal.   ____________________________________________    EKG  EKG viewed and interpreted by myself shows atrial fibrillation with rapid ventricular response at 116 bpm with a widened QRS, left axis deviation, diffuse ST depression.  ____________________________________________     RADIOLOGY  Chest x-ray shows vascular congestion.  ____________________________________________   INITIAL IMPRESSION / ASSESSMENT AND PLAN / ED COURSE  Pertinent labs & imaging results that were available during my care of the patient were reviewed by me and considered in my medical decision making (see chart for details).   Patient presents emergency department with what appears to be atrial fibrillation with rapid ventricular response based on her EKG.  Per record review patient has a history of paroxysmal atrial fibrillation and is currently on anticoagulation.  Patient also takes carvedilol.  Currently the patient appears well denies any tightness at this time.  Denies shortness of breath at any point.  No lower extremity edema.  We will obtain a chest x-ray, lab work including cardiac enzymes and continue to closely monitor.  Given the atrial fibrillation with rapid ventricular response we will dose metoprolol IV as the patient is on beta-blockers at home.  Patient  agreeable to plan of care.  Chest x-ray shows vascular congestion.  Patient troponin increased from 18-38.  Given the increase in troponin and patient remains tachycardic around 110 bpm we will admit to the hospital service for further treatment and work-up.  Joan Willis was evaluated in Emergency Department on 08/28/2019 for the symptoms described in the history of present illness. She was evaluated in the context of the global COVID-19 pandemic, which necessitated consideration that the patient might be at risk for infection with the SARS-CoV-2 virus that causes COVID-19. Institutional protocols and algorithms that pertain to the evaluation of patients at risk for COVID-19 are in a state of rapid change based on information released by regulatory bodies including the CDC and federal and state organizations. These policies and algorithms were followed during the patient's care in the  ED.  ____________________________________________   FINAL CLINICAL IMPRESSION(S) / ED DIAGNOSES  Atrial fibrillation with rapid ventricular response   Minna Antis, MD 08/29/19 [phone]

## 2019-08-29 DIAGNOSIS — I10 Essential (primary) hypertension: Secondary | ICD-10-CM

## 2019-08-29 DIAGNOSIS — R778 Other specified abnormalities of plasma proteins: Secondary | ICD-10-CM

## 2019-08-29 DIAGNOSIS — E039 Hypothyroidism, unspecified: Secondary | ICD-10-CM | POA: Diagnosis present

## 2019-08-29 DIAGNOSIS — R079 Chest pain, unspecified: Secondary | ICD-10-CM | POA: Diagnosis present

## 2019-08-29 DIAGNOSIS — Z79890 Hormone replacement therapy (postmenopausal): Secondary | ICD-10-CM | POA: Diagnosis not present

## 2019-08-29 DIAGNOSIS — I4891 Unspecified atrial fibrillation: Secondary | ICD-10-CM | POA: Diagnosis not present

## 2019-08-29 DIAGNOSIS — E785 Hyperlipidemia, unspecified: Secondary | ICD-10-CM | POA: Diagnosis present

## 2019-08-29 DIAGNOSIS — D631 Anemia in chronic kidney disease: Secondary | ICD-10-CM | POA: Diagnosis present

## 2019-08-29 DIAGNOSIS — I5032 Chronic diastolic (congestive) heart failure: Secondary | ICD-10-CM | POA: Diagnosis present

## 2019-08-29 DIAGNOSIS — Z8249 Family history of ischemic heart disease and other diseases of the circulatory system: Secondary | ICD-10-CM | POA: Diagnosis not present

## 2019-08-29 DIAGNOSIS — J449 Chronic obstructive pulmonary disease, unspecified: Secondary | ICD-10-CM

## 2019-08-29 DIAGNOSIS — Z7901 Long term (current) use of anticoagulants: Secondary | ICD-10-CM | POA: Diagnosis not present

## 2019-08-29 DIAGNOSIS — R072 Precordial pain: Secondary | ICD-10-CM

## 2019-08-29 DIAGNOSIS — N179 Acute kidney failure, unspecified: Secondary | ICD-10-CM | POA: Diagnosis present

## 2019-08-29 DIAGNOSIS — Z79899 Other long term (current) drug therapy: Secondary | ICD-10-CM | POA: Diagnosis not present

## 2019-08-29 DIAGNOSIS — Z20822 Contact with and (suspected) exposure to covid-19: Secondary | ICD-10-CM | POA: Diagnosis present

## 2019-08-29 DIAGNOSIS — I480 Paroxysmal atrial fibrillation: Secondary | ICD-10-CM | POA: Diagnosis present

## 2019-08-29 DIAGNOSIS — N183 Chronic kidney disease, stage 3 unspecified: Secondary | ICD-10-CM | POA: Diagnosis present

## 2019-08-29 DIAGNOSIS — I130 Hypertensive heart and chronic kidney disease with heart failure and stage 1 through stage 4 chronic kidney disease, or unspecified chronic kidney disease: Secondary | ICD-10-CM | POA: Diagnosis present

## 2019-08-29 DIAGNOSIS — I350 Nonrheumatic aortic (valve) stenosis: Secondary | ICD-10-CM | POA: Diagnosis present

## 2019-08-29 DIAGNOSIS — Z90710 Acquired absence of both cervix and uterus: Secondary | ICD-10-CM | POA: Diagnosis not present

## 2019-08-29 DIAGNOSIS — E876 Hypokalemia: Secondary | ICD-10-CM | POA: Diagnosis present

## 2019-08-29 LAB — COMPREHENSIVE METABOLIC PANEL
ALT: 15 U/L (ref 0–44)
AST: 17 U/L (ref 15–41)
Albumin: 3.9 g/dL (ref 3.5–5.0)
Alkaline Phosphatase: 74 U/L (ref 38–126)
Anion gap: 11 (ref 5–15)
BUN: 32 mg/dL — ABNORMAL HIGH (ref 8–23)
CO2: 25 mmol/L (ref 22–32)
Calcium: 9.6 mg/dL (ref 8.9–10.3)
Chloride: 103 mmol/L (ref 98–111)
Creatinine, Ser: 1.55 mg/dL — ABNORMAL HIGH (ref 0.44–1.00)
GFR calc Af Amer: 35 mL/min — ABNORMAL LOW (ref >60)
GFR calc non Af Amer: 30 mL/min — ABNORMAL LOW (ref >60)
Glucose, Bld: 144 mg/dL — ABNORMAL HIGH (ref 70–99)
Potassium: 3.6 mmol/L (ref 3.5–5.1)
Sodium: 139 mmol/L (ref 135–145)
Total Bilirubin: 1.1 mg/dL (ref 0.3–1.2)
Total Protein: 7.1 g/dL (ref 6.5–8.1)

## 2019-08-29 LAB — SARS CORONAVIRUS 2 BY RT PCR (HOSPITAL ORDER, PERFORMED IN ~~LOC~~ HOSPITAL LAB): SARS Coronavirus 2: NEGATIVE

## 2019-08-29 LAB — TSH: TSH: 0.881 u[IU]/mL (ref 0.350–4.500)

## 2019-08-29 LAB — BASIC METABOLIC PANEL
Anion gap: 6 (ref 5–15)
BUN: 27 mg/dL — ABNORMAL HIGH (ref 8–23)
CO2: 26 mmol/L (ref 22–32)
Calcium: 9.1 mg/dL (ref 8.9–10.3)
Chloride: 108 mmol/L (ref 98–111)
Creatinine, Ser: 1.09 mg/dL — ABNORMAL HIGH (ref 0.44–1.00)
GFR calc Af Amer: 53 mL/min — ABNORMAL LOW (ref >60)
GFR calc non Af Amer: 46 mL/min — ABNORMAL LOW (ref >60)
Glucose, Bld: 123 mg/dL — ABNORMAL HIGH (ref 70–99)
Potassium: 3.6 mmol/L (ref 3.5–5.1)
Sodium: 140 mmol/L (ref 135–145)

## 2019-08-29 LAB — LIPID PANEL
Cholesterol: 200 mg/dL (ref 0–200)
HDL: 63 mg/dL (ref >40)
LDL Cholesterol: 117 mg/dL — ABNORMAL HIGH (ref 0–99)
Total CHOL/HDL Ratio: 3.2 RATIO
Triglycerides: 98 mg/dL (ref <150)
VLDL: 20 mg/dL (ref 0–40)

## 2019-08-29 LAB — TROPONIN I (HIGH SENSITIVITY)
Troponin I (High Sensitivity): 18 ng/L — ABNORMAL HIGH (ref <18)
Troponin I (High Sensitivity): 38 ng/L — ABNORMAL HIGH (ref <18)
Troponin I (High Sensitivity): 57 ng/L — ABNORMAL HIGH (ref <18)
Troponin I (High Sensitivity): 62 ng/L — ABNORMAL HIGH (ref <18)

## 2019-08-29 LAB — BRAIN NATRIURETIC PEPTIDE: B Natriuretic Peptide: 216.3 pg/mL — ABNORMAL HIGH (ref 0.0–100.0)

## 2019-08-29 LAB — MAGNESIUM: Magnesium: 2.3 mg/dL (ref 1.7–2.4)

## 2019-08-29 MED ORDER — ASPIRIN EC 81 MG PO TBEC: 81.0000 mg | DELAYED_RELEASE_TABLET | Freq: Every day | ORAL | Status: DC

## 2019-08-29 MED ORDER — ALBUTEROL SULFATE (2.5 MG/3ML) 0.083% IN NEBU: 2.5000 mL | INHALATION_SOLUTION | RESPIRATORY_TRACT | Status: DC | PRN

## 2019-08-29 MED ORDER — CARVEDILOL 12.5 MG PO TABS
12.5000 mg | ORAL_TABLET | Freq: Two times a day (BID) | ORAL | Status: DC
  Administered 2019-08-29: 12.5 mg via ORAL
  Filled 2019-08-29: qty 1

## 2019-08-29 MED ORDER — ISOSORBIDE MONONITRATE ER 30 MG PO TB24
30.0000 mg | ORAL_TABLET | Freq: Every day | ORAL | Status: DC
  Administered 2019-08-29 – 2019-08-30 (×2): 30 mg via ORAL
  Filled 2019-08-29 (×3): qty 1

## 2019-08-29 MED ORDER — ASPIRIN 300 MG RE SUPP: 300.0000 mg | RECTAL | Status: AC

## 2019-08-29 MED ORDER — ASPIRIN 81 MG PO CHEW
324.0000 mg | CHEWABLE_TABLET | ORAL | Status: AC
  Administered 2019-08-29: 324 mg via ORAL
  Filled 2019-08-29: qty 4

## 2019-08-29 MED ORDER — METOPROLOL TARTRATE 25 MG PO TABS: 25.0000 mg | ORAL_TABLET | Freq: Four times a day (QID) | ORAL | Status: DC

## 2019-08-29 MED ORDER — LEVOTHYROXINE SODIUM 100 MCG PO TABS
100.0000 ug | ORAL_TABLET | Freq: Every day | ORAL | Status: DC
  Administered 2019-08-29 – 2019-08-30 (×2): 100 ug via ORAL
  Filled 2019-08-29: qty 2
  Filled 2019-08-29: qty 1

## 2019-08-29 MED ORDER — MONTELUKAST SODIUM 10 MG PO TABS
10.0000 mg | ORAL_TABLET | Freq: Every day | ORAL | Status: DC
  Administered 2019-08-29 – 2019-08-30 (×2): 10 mg via ORAL
  Filled 2019-08-29 (×3): qty 1

## 2019-08-29 MED ORDER — METOPROLOL TARTRATE 5 MG/5ML IV SOLN
5.0000 mg | Freq: Once | INTRAVENOUS | Status: AC
  Administered 2019-08-29: 5 mg via INTRAVENOUS
  Filled 2019-08-29: qty 5

## 2019-08-29 MED ORDER — ATORVASTATIN CALCIUM 20 MG PO TABS
40.0000 mg | ORAL_TABLET | Freq: Every day | ORAL | Status: DC
  Administered 2019-08-29 (×2): 40 mg via ORAL
  Filled 2019-08-29 (×2): qty 2

## 2019-08-29 MED ORDER — LISINOPRIL 20 MG PO TABS
40.0000 mg | ORAL_TABLET | Freq: Every day | ORAL | Status: DC
  Administered 2019-08-29 – 2019-08-30 (×2): 40 mg via ORAL
  Filled 2019-08-29 (×2): qty 2

## 2019-08-29 MED ORDER — CARVEDILOL 6.25 MG PO TABS: 6.2500 mg | ORAL_TABLET | Freq: Two times a day (BID) | ORAL | Status: DC

## 2019-08-29 MED ORDER — ISOSORBIDE MONONITRATE ER 60 MG PO TB24: 30.0000 mg | ORAL_TABLET | Freq: Every day | ORAL | Status: DC

## 2019-08-29 MED ORDER — RIVAROXABAN 15 MG PO TABS
15.0000 mg | ORAL_TABLET | Freq: Every day | ORAL | Status: DC
  Administered 2019-08-29: 15 mg via ORAL
  Filled 2019-08-29 (×2): qty 1

## 2019-08-29 MED ORDER — POTASSIUM CHLORIDE CRYS ER 20 MEQ PO TBCR
20.0000 meq | EXTENDED_RELEASE_TABLET | Freq: Two times a day (BID) | ORAL | Status: AC
  Administered 2019-08-29 (×2): 20 meq via ORAL
  Filled 2019-08-29 (×2): qty 1

## 2019-08-29 MED ORDER — AMLODIPINE BESYLATE 5 MG PO TABS: 10.0000 mg | ORAL_TABLET | Freq: Every day | ORAL | Status: DC

## 2019-08-29 MED ORDER — ACETAMINOPHEN 325 MG PO TABS: 650.0000 mg | ORAL_TABLET | ORAL | Status: DC | PRN

## 2019-08-29 MED ORDER — HYDRALAZINE HCL 50 MG PO TABS
100.0000 mg | ORAL_TABLET | Freq: Three times a day (TID) | ORAL | Status: DC
  Administered 2019-08-29 – 2019-08-30 (×4): 100 mg via ORAL
  Filled 2019-08-29 (×4): qty 2

## 2019-08-29 MED ORDER — ONDANSETRON HCL 4 MG/2ML IJ SOLN: 4.0000 mg | Freq: Four times a day (QID) | INTRAMUSCULAR | Status: DC | PRN

## 2019-08-29 MED ORDER — BUDESONIDE 0.25 MG/2ML IN SUSP
4.0000 mL | Freq: Every day | RESPIRATORY_TRACT | Status: DC
  Administered 2019-08-29 – 2019-08-30 (×2): 0.5 mg via RESPIRATORY_TRACT
  Filled 2019-08-29 (×2): qty 4

## 2019-08-29 MED ORDER — FUROSEMIDE 20 MG PO TABS
20.0000 mg | ORAL_TABLET | Freq: Every day | ORAL | Status: DC
  Administered 2019-08-29 – 2019-08-30 (×2): 20 mg via ORAL
  Filled 2019-08-29 (×2): qty 1

## 2019-08-29 MED ORDER — NITROGLYCERIN 0.4 MG SL SUBL: 0.4000 mg | SUBLINGUAL_TABLET | SUBLINGUAL | Status: DC | PRN

## 2019-08-29 NOTE — Progress Notes (Signed)
[facility] PLAN OF CARE NOTE Patient: NIJA KOOPMAN CYE:[id number]   PCP: Leanna Sato, MD DOB: [birth year]   DOA: 08/28/2019   DOS: 08/29/2019    Patient was admitted by my colleague Dr. Para March earlier on 08/29/2019. I have reviewed the H&P as well as assessment and plan and agree with the same. Important changes in the plan are listed below.  Plan of care: Principal Problem:   Paroxysmal atrial fibrillation with rapid ventricular response (HCC) Active Problems:   HTN (hypertension)   Hypothyroidism   Chronic diastolic CHF (congestive heart failure) (HCC)   Elevated troponin   Chest pain, precordial   COPD with chronic bronchitis (HCC)   Chest pain Elevated troponin in the setting of demand ischemia. We will continue to monitor. Heart rate still 111. Blood pressure also elevated. Discontinue Norvasc. Increase Coreg from 6.25-12.5. Discontinue bedrest. Consult cardiology.  Author: Lynden Oxford, MD [facility] 08/29/2019 7:16 AM   If 7PM-7AM, please contact night-coverage at [email or web address]

## 2019-08-29 NOTE — H&P (Signed)
History and Physical    Joan Willis QPY:[id number] DOB: [birth year] DOA: 08/28/2019  PCP: Marguerita Merles, MD   Patient coming from: Home  I have personally briefly reviewed patient's old medical records in [place]  Chief Complaint: Chest tightness, palpitations  HPI: Joan Willis is a 86 y.o. female with medical history significant for diastolic heart failure, EF 55-60, HTN,pAF on Xarelto, CKD 3, COPD who presents to the emergency room with sudden onset palpitations associated with chest pressure.  Chest pain is precordial nonradiating of moderate intensity, aggravated with exertion.  No associated nausea, vomiting, diaphoresis or shortness of breath.  Of note, patient was hospitalized and September 2020 with chest pain and had a low risk stress test at that time.  ED Course: On arrival she was noted to be in rapid A. fib with a rate of 120-130.  Blood work showed troponin 18>>38.  Creatinine 1.55 which is baseline.  Chest x-ray showed cardiomegaly with mild pulmonary vascular congestion.  Patient received 2 doses of IV metoprolol in the emergency room with improvement in heart rate to 100-110.  Hospitalist consulted for admission. Review of Systems: As per HPI otherwise 10 point review of systems negative.    Past Medical History:  Diagnosis Date  . (HFpEF) heart failure with preserved ejection fraction ([place])    a. TTE 9/20 - EF 55 to 12%, no LVH, diastolic dysfunction, normal RV systolic function, mildly dilated left atrium, mild mitral regurgitation, mild tricuspid regurgitation, mild aortic stenosis, moderately elevated PASP  . Asthma   . Carotid artery disease ([place])   . Hyperlipemia   . Hypertension   . Hypothyroid   . PAF (paroxysmal atrial fibrillation) ([place])    a.  Diagnosed 9/20; b. CHADS2VASc 6 (CHF, HTN, age x 2, vascular disease, female); c. on Eliquis    Past Surgical History:  Procedure Laterality Date  . ABDOMINAL HYSTERECTOMY    . APPENDECTOMY    . arm  surgery    . cartotid endaractomy       reports that she has never smoked. She has never used smokeless tobacco. She reports that she does not drink alcohol or use drugs.  No Known Allergies  Family History  Problem Relation Age of Onset  . Hypertension Mother      Prior to Admission medications   Medication Sig Start Date End Date Taking? Authorizing Provider  acetaminophen (TYLENOL) 325 MG tablet Take 325 mg by mouth every 6 (six) hours as needed for moderate pain.   Yes [provider]  albuterol (PROVENTIL HFA;VENTOLIN HFA) 108 (90 Base) MCG/ACT inhaler Inhale 2 puffs into the lungs every 6 (six) hours as needed for wheezing or shortness of breath. 10/11/17  Yes Wilhelmina Mcardle, MD  albuterol (PROVENTIL) (2.5 MG/3ML) 0.083% nebulizer solution Inhale 2.5 mLs into the lungs every 4 (four) hours as needed for wheezing or shortness of breath.  09/13/17  Yes [provider]  amLODipine (NORVASC) 10 MG tablet Take 10 mg by mouth daily. 05/04/17  Yes [provider]  atorvastatin (LIPITOR) 40 MG tablet Take 40 mg by mouth daily. 04/18/17  Yes [provider]  carvedilol (COREG) 6.25 MG tablet Take 1 tablet (6.25 mg total) by mouth 2 (two) times daily. 04/20/19  Yes Loel Dubonnet, NP  FLOVENT HFA 220 MCG/ACT inhaler Inhale 2 puffs into the lungs daily. 01/17/18  Yes Wilhelmina Mcardle, MD  furosemide (LASIX) 20 MG tablet Take 20 mg by mouth daily. 04/29/17  Yes [provider]  hydrALAZINE (APRESOLINE) 100 MG tablet TAKE 1 TABLET (100 MG TOTAL) BY MOUTH 3 (THREE) TIMES DAILY. 04/24/19  Yes Dunn, Raymon Mutton, PA-C  isosorbide mononitrate (IMDUR) 30 MG 24 hr tablet TAKE 1 TABLET BY MOUTH EVERY DAY 07/30/19  Yes Dunn, Raymon Mutton, PA-C  levothyroxine (SYNTHROID, LEVOTHROID) 100 MCG tablet Take 100 mcg by mouth daily. 04/29/17  Yes [provider]  lisinopril (PRINIVIL,ZESTRIL) 40 MG tablet Take 40 mg by mouth daily. 06/28/17  Yes [provider]   montelukast (SINGULAIR) 10 MG tablet Take 10 mg by mouth daily. 07/19/17  Yes [provider]  Rivaroxaban (XARELTO) 15 MG TABS tablet Take 1 tablet (15 mg total) by mouth daily with supper. 01/15/19  Yes Dunn, Raymon Mutton, PA-C  Vitamin D3 (VITAMIN D) 25 MCG tablet Take 1,000 Units by mouth daily.   Yes [provider]    Physical Exam: Vitals:   08/29/19 0230 08/29/19 0300 08/29/19 0330 08/29/19 0400  BP: 140/79 (!) 145/78 (!) 151/81 (!) 154/87  Pulse: (!) 106 (!) 110 (!) 109 (!) 106  Resp: 14 15 13 14   Temp:      TempSrc:      SpO2: 95% 96% 97% 95%  Weight:      Height:         Vitals:   08/29/19 0230 08/29/19 0300 08/29/19 0330 08/29/19 0400  BP: 140/79 (!) 145/78 (!) 151/81 (!) 154/87  Pulse: (!) 106 (!) 110 (!) 109 (!) 106  Resp: 14 15 13 14   Temp:      TempSrc:      SpO2: 95% 96% 97% 95%  Weight:      Height:        Constitutional: Alert and awake, oriented x3, not in any acute distress. Eyes: PERLA, EOMI, irises appear normal, anicteric sclera,  ENMT: external ears and nose appear normal, normal hearing             Lips appears normal, oropharynx mucosa, tongue, posterior pharynx appear normal  Neck: neck appears normal, no masses, normal ROM, no thyromegaly, no JVD  CVS: S1-S2 clear,irregular, no murmur rubs or gallops,  , no carotid bruits, pedal pulses palpable, No LE edema. Respiratory:  clear to auscultation bilaterally, no wheezing, rales or rhonchi. Respiratory effort normal. No accessory muscle use.  Abdomen: soft nontender, nondistended, normal bowel sounds, no hepatosplenomegaly, no hernias Musculoskeletal: : no cyanosis, clubbing , no contractures or atrophy Neuro: Cranial nerves II-XII intact, sensation, reflexes normal, strength Psych: judgement and insight appear normal, stable mood and affect,  Skin: no rashes or lesions or ulcers, no induration or nodules   Labs on Admission: I have personally reviewed following labs and imaging  studies  CBC: Recent Labs  Lab 08/28/19 2341  WBC 9.7  NEUTROABS 6.7  HGB 13.1  HCT 40.6  MCV 89.2  PLT 269   Basic Metabolic Panel: Recent Labs  Lab 08/28/19 2341  NA 139  K 3.6  CL 103  CO2 25  GLUCOSE 144*  BUN 32*  CREATININE 1.55*  CALCIUM 9.6   GFR: Estimated Creatinine Clearance: 25.8 mL/min (A) (by C-G formula based on SCr of 1.55 mg/dL (H)). Liver Function Tests: Recent Labs  Lab 08/28/19 2341  AST 17  ALT 15  ALKPHOS 74  BILITOT 1.1  PROT 7.1  ALBUMIN 3.9   No results for input(s): LIPASE, AMYLASE in the last 168 hours. No results for input(s): AMMONIA in the last 168 hours. Coagulation Profile: No  results for input(s): INR, PROTIME in the last 168 hours. Cardiac Enzymes: No results for input(s): CKTOTAL, CKMB, CKMBINDEX, TROPONINI in the last 168 hours. BNP (last 3 results) No results for input(s): PROBNP in the last 8760 hours. HbA1C: No results for input(s): HGBA1C in the last 72 hours. CBG: No results for input(s): GLUCAP in the last 168 hours. Lipid Profile: No results for input(s): CHOL, HDL, LDLCALC, TRIG, CHOLHDL, LDLDIRECT in the last 72 hours. Thyroid Function Tests: No results for input(s): TSH, T4TOTAL, FREET4, T3FREE, THYROIDAB in the last 72 hours. Anemia Panel: No results for input(s): VITAMINB12, FOLATE, FERRITIN, TIBC, IRON, RETICCTPCT in the last 72 hours. Urine analysis:    Component Value Date/Time   COLORURINE STRAW (A) 12/31/2018 2334   APPEARANCEUR HAZY (A) 12/31/2018 2334   LABSPEC 1.009 12/31/2018 2334   PHURINE 8.0 12/31/2018 2334   GLUCOSEU NEGATIVE 12/31/2018 2334   HGBUR NEGATIVE 12/31/2018 2334   BILIRUBINUR NEGATIVE 12/31/2018 2334   KETONESUR NEGATIVE 12/31/2018 2334   PROTEINUR 30 (A) 12/31/2018 2334   NITRITE NEGATIVE 12/31/2018 2334   LEUKOCYTESUR NEGATIVE 12/31/2018 2334    Radiological Exams on Admission: DG Chest 1 View  Result Date: 08/29/2019 CLINICAL DATA:  Heart racing chest tightness EXAM:  CHEST  1 VIEW COMPARISON:  12/31/2018, 09/15/2017 FINDINGS: Cardiomegaly with central congestion. Mild diffuse interstitial opacity some of which is suspected to be due to chronic change though superimposed interstitial edema is possible. No pleural effusion. Aortic atherosclerosis. No pneumothorax. IMPRESSION: Cardiomegaly with vascular congestion. Diffuse interstitial process, some of which is suspected to be chronic, but cannot exclude mild acute superimposed interstitial edema. Electronically Signed   By: Jasmine Pang M.D.   On: 08/29/2019 00:13    EKG: Independently reviewed.   Assessment/Plan Principal Problem:   Paroxysmal atrial fibrillation with rapid ventricular response (HCC) -Patient presents with palpitation and associated chest tightness with ventricular response 120-130s, improving with IV metoprolol -Continue home Coreg for rate control -Continue Xarelto    Elevated troponin   Chest pain, precordial -Troponin up trended from 18-38, suspect demand ischemia from rapid A. fib -History of low risk stress test in September 2020 -Continue carvedilol, atorvastatin, Imdur -Consult cardiology.  Patient follows with Dr. Kirke Corin    HTN (hypertension) -Patient on several antihypertensives -Continue amlodipine, carvedilol, lisinopril.  Hold hydralazine for now and resume with blood pressure remains elevated with above  CKD 3 -Renal function at baseline  Hypothyroidism -Continue levothyroxine    Chronic diastolic CHF (congestive heart failure) (HCC) -Patient appears euvolemic, and not symptomatic for exacerbation however chest x-ray did show mild pulmonary vascular congestion.  BNP pending -Continue carvedilol, lisinopril and furosemide -Last EF 55 to 60%    COPD -Not acutely exacerbated -DuoNebs as needed    DVT prophylaxis: On Xarelto Code Status: full code  Family Communication:  none  Disposition Plan: Back to previous home environment Consults called:  Cardiology Status: Observation    Andris Baumann MD [facility]     08/29/2019, 4:24 AM

## 2019-08-29 NOTE — ED Notes (Signed)
Admitting MD at bedside to see pt.

## 2019-08-29 NOTE — Consult Note (Addendum)
Cardiology Consultation:   Patient ID: Joan Willis MRN: [id number]; DOB: May 07, 1929  Admit date: 08/28/2019 Date of Consult: 08/29/2019  Primary Care Provider: Leanna Sato, MD Primary Cardiologist: Lorine Bears, MD  Primary Electrophysiologist:  None    Patient Profile:   Joan Willis is a 86 y.o. 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 Para March.  History of Present Illness:   Joan Willis is an 86 year old female with PMH as above.  She was admitted to [facility] 07/2017 with acute on chronic diastolic heart failure in the setting of bronchitis and poorly controlled blood pressure.  Echo showed normal LVSF with moderate LVH and G2DD, mild pulmonary hypertension, and aortic sclerosis.    She was hospitalized 12/2018 with chest pain and shortness of breath and noted to be in atrial fibrillation with rapid ventricular response.  Echo with EF 55-60, mild LAE, mild MR/TR/AS, pseudonormalization pattern of LV diastolic filling, and moderately elevated PASP. MPI showed small defect of mild severity in the apical and anterior location, likely due to breast attenuation and overall ruled a low risk study.    She was seen 01/2019 with a rash attributed to Eliquis and switched to Xarelto.  It was noted she continued to have issues with uncontrolled hypertension.  Carvedilol could not be increased due to bradycardia.   She was last seen in the office 07/19/2019 by her primary cardiologist and reported taking her medications regularly and a low-sodium diet.  Her blood pressure continued to be elevated.  Renal artery duplex was requested given her history of atrophied congenital kidney with one  functioning kidney.  On review of EMR, however, it does not appear as if this RAS study has yet been performed.  Yesterday, 08/28/2019, she presented to [facility] ED with complaint of chest tightness.  On consultation today, she reports that she never had any chest pain but rather a feeling of chest tightness and associated shortness of breath.  She stated that this initially started when she was lying in bed that same evening and noticed " her heart running away from her."  She reports that these were similar symptoms to those prior to her presentation/admission in September 2020.  She initially tried to "walk off" the shortness of breath and chest tightness; however, when this did not improve, she called her daughter and came to the ED. She denies any presyncope, syncope, falls, or LOC. No lower extremity edema, abdominal distention, early satiety, or orthopnea. No signs or symptoms of bleeding. She confirms medication compliance, including with her Xarelto.    In the ED, EKG showed Afib with RVR and ventricular rate 116 bpm, LVH with repolarization abnormality, prolonged QTC at 525 ms, left axis deviation, IVCD with QRS 114 ms, and diffuse depression as noted below V2-V3,V4-V6, II, and with elevation in V1/avR. Of note, this diffuse depression with V1/avR elevation was noted in the 12/2018 EKG.  Labs significant for hypokalemia with potassium 3.6.  Also noted was AKI / elevated creatinine at 1.55 and BUN 32. HS Tn 18  38  57  62. BNP 216.3. LDL 117 with total cholesterol 200.  Chest x-ray showed cardiomegaly with vascular congestion, diffuse interstitial process (some of which was suspected to be chronic but  cannot exclude mild acute superimposed interstitial edema). Of note, today she denies past or current CP. She reports earlier chest tightness / SOB and racing HR, which are now improved.  Heart Pathway Score:     Past Medical History:  Diagnosis Date  . (HFpEF) heart failure with preserved ejection fraction  (HCC)    a. TTE 9/20 - EF 55 to 60%, no LVH, diastolic dysfunction, normal RV systolic function, mildly dilated left atrium, mild mitral regurgitation, mild tricuspid regurgitation, mild aortic stenosis, moderately elevated PASP  . Asthma   . Carotid artery disease (HCC)   . Hyperlipemia   . Hypertension   . Hypothyroid   . PAF (paroxysmal atrial fibrillation) (HCC)    a.  Diagnosed 9/20; b. CHADS2VASc 6 (CHF, HTN, age x 2, vascular disease, female); c. on Eliquis    Past Surgical History:  Procedure Laterality Date  . ABDOMINAL HYSTERECTOMY    . APPENDECTOMY    . arm surgery    . cartotid endaractomy       Home Medications:  Prior to Admission medications   Medication Sig Start Date End Date Taking? Authorizing Provider  acetaminophen (TYLENOL) 325 MG tablet Take 325 mg by mouth every 6 (six) hours as needed for moderate pain.   Yes [provider]  albuterol (PROVENTIL HFA;VENTOLIN HFA) 108 (90 Base) MCG/ACT inhaler Inhale 2 puffs into the lungs every 6 (six) hours as needed for wheezing or shortness of breath. 10/11/17  Yes Merwyn KatosSimonds, David B, MD  albuterol (PROVENTIL) (2.5 MG/3ML) 0.083% nebulizer solution Inhale 2.5 mLs into the lungs every 4 (four) hours as needed for wheezing or shortness of breath.  09/13/17  Yes [provider]  amLODipine (NORVASC) 10 MG tablet Take 10 mg by mouth daily. 05/04/17  Yes [provider]  atorvastatin (LIPITOR) 40 MG tablet Take 40 mg by mouth daily. 04/18/17  Yes [provider]  carvedilol (COREG) 6.25 MG tablet Take 1 tablet (6.25 mg total) by mouth 2 (two) times daily. 04/20/19  Yes Alver SorrowWalker, Caitlin S, NP  FLOVENT HFA 220 MCG/ACT inhaler Inhale 2 puffs into the lungs daily. 01/17/18  Yes Merwyn KatosSimonds, David B, MD  furosemide (LASIX) 20 MG tablet Take 20 mg by mouth daily. 04/29/17  Yes [provider]  hydrALAZINE (APRESOLINE) 100 MG tablet TAKE 1 TABLET (100 MG TOTAL) BY MOUTH 3 (THREE) TIMES DAILY. 04/24/19  Yes  Dunn, Raymon Muttonyan M, PA-C  isosorbide mononitrate (IMDUR) 30 MG 24 hr tablet TAKE 1 TABLET BY MOUTH EVERY DAY 07/30/19  Yes Dunn, Raymon Muttonyan M, PA-C  levothyroxine (SYNTHROID, LEVOTHROID) 100 MCG tablet Take 100 mcg by mouth daily. 04/29/17  Yes [provider]  lisinopril (PRINIVIL,ZESTRIL) 40 MG tablet Take 40 mg by mouth daily. 06/28/17  Yes [provider]  montelukast (SINGULAIR) 10 MG tablet Take 10 mg by mouth daily. 07/19/17  Yes [provider]  Rivaroxaban (XARELTO) 15 MG TABS tablet Take 1 tablet (15 mg total) by mouth daily with supper. 01/15/19  Yes Dunn, Raymon Muttonyan M, PA-C  Vitamin D3 (VITAMIN D) 25 MCG tablet Take 1,000 Units by mouth daily.   Yes [provider]    Inpatient Medications: Scheduled Meds: . [START ON 08/30/2019] aspirin EC  81 mg Oral Daily  . atorvastatin  40 mg Oral Daily  . budesonide  4 mL Inhalation Daily  . furosemide  20 mg Oral Daily  . hydrALAZINE  100 mg Oral Q8H  . isosorbide mononitrate  30 mg Oral Daily  . levothyroxine  100 mcg Oral Daily  . lisinopril  40 mg Oral Daily  . metoprolol tartrate  25 mg Oral Q6H  . montelukast  10 mg Oral Daily  . Rivaroxaban  15 mg Oral Q supper   Continuous Infusions:  PRN Meds: acetaminophen, albuterol, nitroGLYCERIN, ondansetron (ZOFRAN) IV  Allergies:   No Known Allergies  Social History:   Social History   Socioeconomic History  . Marital status: Widowed    Spouse name: Not on file  . Number of children: Not on file  . Years of education: Not on file  . Highest education level: Not on file  Occupational History  . Not on file  Tobacco Use  . Smoking status: Never Smoker  . Smokeless tobacco: Never Used  . Tobacco comment: pt states she has never smoked  Substance and Sexual Activity  . Alcohol use: Never  . Drug use: Never  . Sexual activity: Not on file  Other Topics Concern  . Not on file  Social History Narrative  . Not on file   Social Determinants of Health    Financial Resource Strain:   . Difficulty of Paying Living Expenses:   Food Insecurity:   . Worried About Programme researcher, broadcasting/film/video in the Last Year:   . Barista in the Last Year:   Transportation Needs:   . Freight forwarder (Medical):   Marland Kitchen Lack of Transportation (Non-Medical):   Physical Activity:   . Days of Exercise per Week:   . Minutes of Exercise per Session:   Stress:   . Feeling of Stress :   Social Connections:   . Frequency of Communication with Friends and Family:   . Frequency of Social Gatherings with Friends and Family:   . Attends Religious Services:   . Active Member of Clubs or Organizations:   . Attends Banker Meetings:   Marland Kitchen Marital Status:   Intimate Partner Violence:   . Fear of Current or Ex-Partner:   . Emotionally Abused:   Marland Kitchen Physically Abused:   . Sexually Abused:     Family History:    Family History  Problem Relation Age of Onset  . Hypertension Mother      ROS:  Please see the history of present illness.  Review of Systems  Respiratory: Positive for shortness of breath. Negative for hemoptysis.   Cardiovascular: Negative for chest pain, palpitations, orthopnea, leg swelling and PND.       Denies chest pain and reports instead chest tightness.  Reports racing heart rate but not palpitations.  Gastrointestinal: Negative for blood in stool and melena.  Genitourinary: Negative for hematuria.  Musculoskeletal: Negative for falls.  Neurological: Negative for dizziness and loss of consciousness.  All other systems reviewed and are negative.   All other ROS reviewed and negative.     Physical Exam/Data:   Vitals:   08/29/19 0908 08/29/19 1032 08/29/19 1127 08/29/19 1208  BP: 138/76  113/85   Pulse: (!) 108 (!) 104 (!) 105 (!) 105  Resp: Temp: 97.8 F (36.6 C)  98 F (36.7 C)   TempSrc: Oral  Oral   SpO2: 98%  95% 95%  Weight: 72.6 kg     Height:  (1.651 m)      No intake or output data in the 24  hours ending 08/29/19 1318 Last 3 Weights 08/29/2019 08/28/2019 07/19/2019  Weight (lbs) 160 lb 163 lb 163 lb 6 oz  Weight (kg)  72.576 kg 73.936 kg 74.106 kg     Body mass index is 26.63 kg/m.  General:  Well nourished, well developed, in no acute distress HEENT: normal Neck: no JVD Vascular: No carotid bruits; radial pulses 2+ bilaterally Cardiac:  normal S1, S2; IRIR and tachycardic; 1/6 systolic murmur Lungs:  clear to auscultation bilaterally, no wheezing, rhonchi or rales  Abd: soft, nontender, no hepatomegaly  Ext: no edema Musculoskeletal:  No deformities, BUE and BLE strength normal and equal Skin: warm and dry  Neuro:  No focal abnormalities noted Psych:  Normal affect   EKG:  The EKG was personally reviewed and demonstrates:  EKG showed Afib with RVR and ventricular rate 116 bpm, LVH with repolarization abnormality, prolonged QTC at 525 ms, left axis deviation, IVCD with QRS 114 ms, diffuse ST depression in II, avF, V2-V3, V4-V6, elevation in V1/aVR. Consistent with 12/2018 EKG. Telemetry:  Telemetry was personally reviewed and demonstrates:  Afib with RVR  Relevant CV Studies: Echo 01/01/2019 1. Left ventricular ejection fraction, by visual estimation, is 55 to  60%. The left ventricle has normal function. Normal left ventricular size.  There is no left ventricular hypertrophy.  2. Left ventricular diastolic Doppler parameters are consistent with  pseudonormalization pattern of LV diastolic filling.  3. Global right ventricle has normal systolic function.The right  ventricular size is normal. No increase in right ventricular wall  thickness.  4. Left atrial size was mildly dilated.  5. Right atrial size was normal.  6. The mitral valve is grossly normal. Mild mitral valve regurgitation.  No evidence of mitral stenosis.  7. The tricuspid valve is grossly normal. Tricuspid valve regurgitation  is mild.  8. The aortic valve is abnormal Aortic valve regurgitation was  not  visualized by color flow Doppler. Mild aortic valve stenosis.  9. The pulmonic valve was normal in structure. Pulmonic valve  regurgitation is not visualized by color flow Doppler.  10. Moderately elevated pulmonary artery systolic pressure.   MPI 12/2018 Myoview 12/2018:  There was no ST segment deviation noted during stress.  Defect 1: There is a small defect of mild severity present in the apical anterior and apex location. This is likely due to breast attenuation.  The study is normal.  This is a low risk study.  The left ventricular ejection fraction is normal (55-65%).  Laboratory Data:  High Sensitivity Troponin:   Recent Labs  Lab 08/28/19 2341 08/29/19 0151 08/29/19 0757 08/29/19 0925  TROPONINIHS 18* 38* 57* 62*     Cardiac EnzymesNo results for input(s): TROPONINI in the last 168 hours. No results for input(s): TROPIPOC in the last 168 hours.  Chemistry Recent Labs  Lab 08/28/19 2341 08/29/19 0757  NA 139 140  K 3.6 3.6  CL 103 108  CO2 25 26  GLUCOSE 144* 123*  BUN 32* 27*  CREATININE 1.55* 1.09*  CALCIUM 9.6 9.1  GFRNONAA 30* 46*  GFRAA 35* 53*  ANIONGAP 11 6    Recent Labs  Lab 08/28/19 2341  PROT 7.1  ALBUMIN 3.9  AST 17  ALT 15  ALKPHOS 74  BILITOT 1.1   Hematology Recent Labs  Lab 08/28/19 2341  WBC 9.7  RBC 4.55  HGB 13.1  HCT 40.6  MCV 89.2  MCH 28.8  MCHC 32.3  RDW 14.1  PLT 269   BNP Recent Labs  Lab 08/29/19 0418  BNP 216.3*    DDimer No results for input(s): DDIMER in the last 168 hours.   Radiology/Studies:  DG Chest 1 View  Result Date: 08/29/2019 CLINICAL DATA:  Heart racing chest tightness EXAM: CHEST  1 VIEW COMPARISON:  12/31/2018, 09/15/2017 FINDINGS: Cardiomegaly with central congestion. Mild diffuse interstitial opacity some of which is suspected to be due to chronic change though superimposed interstitial edema is possible. No pleural effusion. Aortic atherosclerosis. No pneumothorax. IMPRESSION:  Cardiomegaly with vascular congestion. Diffuse interstitial process, some of which is suspected to be chronic, but cannot exclude mild acute superimposed interstitial edema. Electronically Signed   By: Jasmine Pang M.D.   On: 08/29/2019 00:13    Assessment and Plan:   Paroxysmal atrial fibrillation with RVR Intolerance to Eliquis on Xarelto --Remains in Afib with unclear trigger. Last seen in clinic 07/19/2019, at which time she was in NSR.  Presented to [facility] ED 5/18 in AFib with associated shortness of breath and chest tightness, which she states have since improved. --Coreg discontinued and started on metoprolol tartrate 25mg  q6h per MD and given response to IV lopressor. Titrate as needed for ventricular rate control and as BP allows. Plan for transition to Toprol XL at discharge. Goal ventricular rate <110bpm.  --Recommend replete K with goal 4.0 to reduce further risk of arrhythmia. Daily BMET. Check Mg with goal 2.0. 08/29/19 - TSH 0.881.  --Continue OAC with renally dosed Xarelto 15mg  q dinner. Current CrCl 40mL/min. She has not missed any doses and denies s/sx of bleeding. Discontinued ASA to reduce risk of bleeding. CHA2DS2VASc score of at least 6 (CHF, HTN, agex2, vascular, female) with recommendation for long term OAC. Daily CBC.  --If she remains symptomatic in Afib with RVR, or if ventricular rates prove difficult to control, could consider DCCV given she has not missed any doses of OAC.   Chest tightness / SOB Elevated HS Tn --Denies CP. Reports chest tightness 2/2 SOB and improved with control of ventricular rate. Consider, however, SOB as anginal equivalent. --HS Tn 18  38  57  62 and minimally elevated, flat trending. Continue to cycle until peaked and down-trending. EKG as above with diffuse ST depression and ST elevation noted in V1/avR. Consistent with 12/2018 EKG when in Afib and should consider tachycardia induced subendocardial ischemia. Previous echo 12/2018 with nl EF. Could  repeat echo once rate well controlled to reassess EF and wall motion and rule out acute structural changes or WMA. Continue medical management and risk factor modification with statin as below. Discontinued ASA given on Xarelto and to reduce risk of bleeding.  Hypokalemia --Replete K with goal 4.0 to reduce further risk of arrhythmia. Check Mg with goal 2.0.   Chronic HFpEF --SOB improved with control of ventricular rate.    --BNP 216.3.  --Echo with nl EF, pseudonormalization of LV diastolic filling, and moderately elevated PASP.   --Euvolemic on exam.  --Continue low-dose Lasix 20mg  qd.    AOCKD --AKI improved with Cr 1.55  1.09 with BUN 32  27.  --Daily BMET.  --RAS study pending as outlined in HPI above.   HLD --LDL 117 with total cholesterol 200. Continue statin. Could consider up-titration for more optimal LDL control now or as an outpatient.   HTN --BP currently well controlled. Continue current medications with BB, Lasix, hydralazine, Imdur, and ACE. Of note, PTA amlodipine is currently held.  Carotid artery dz s/p CEA --Continue Xarelto (in place of ASA) and statin.   Mild AS --Noted by 12/2018 echo. Continue to monitor with periodic echo.    For questions or updates, please contact [facility] Please consult  [email or web address] for contact info under     Signed, Lennon Alstrom, PA-C  08/29/2019 1:18 PM

## 2019-08-30 DIAGNOSIS — J449 Chronic obstructive pulmonary disease, unspecified: Secondary | ICD-10-CM

## 2019-08-30 DIAGNOSIS — I4891 Unspecified atrial fibrillation: Secondary | ICD-10-CM

## 2019-08-30 DIAGNOSIS — R079 Chest pain, unspecified: Secondary | ICD-10-CM

## 2019-08-30 LAB — CBC
HCT: 37.5 % (ref 36.0–46.0)
Hemoglobin: 12.4 g/dL (ref 12.0–15.0)
MCH: 29.2 pg (ref 26.0–34.0)
MCHC: 33.1 g/dL (ref 30.0–36.0)
MCV: 88.4 fL (ref 80.0–100.0)
Platelets: 245 10*3/uL (ref 150–400)
RBC: 4.24 MIL/uL (ref 3.87–5.11)
RDW: 14.4 % (ref 11.5–15.5)
WBC: 9.6 10*3/uL (ref 4.0–10.5)
nRBC: 0 % (ref 0.0–0.2)

## 2019-08-30 LAB — BASIC METABOLIC PANEL
Anion gap: 9 (ref 5–15)
BUN: 33 mg/dL — ABNORMAL HIGH (ref 8–23)
CO2: 25 mmol/L (ref 22–32)
Calcium: 9 mg/dL (ref 8.9–10.3)
Chloride: 107 mmol/L (ref 98–111)
Creatinine, Ser: 1.55 mg/dL — ABNORMAL HIGH (ref 0.44–1.00)
GFR calc Af Amer: 35 mL/min — ABNORMAL LOW (ref >60)
GFR calc non Af Amer: 30 mL/min — ABNORMAL LOW (ref >60)
Glucose, Bld: 113 mg/dL — ABNORMAL HIGH (ref 70–99)
Potassium: 4.1 mmol/L (ref 3.5–5.1)
Sodium: 141 mmol/L (ref 135–145)

## 2019-08-30 MED ORDER — METOPROLOL TARTRATE 25 MG PO TABS: 25.0000 mg | ORAL_TABLET | Freq: Two times a day (BID) | ORAL | Status: DC

## 2019-08-30 MED ORDER — CARVEDILOL 6.25 MG PO TABS: 6.2500 mg | ORAL_TABLET | Freq: Two times a day (BID) | ORAL | Status: DC

## 2019-08-30 MED ORDER — BUDESONIDE 0.5 MG/2ML IN SUSP: 0.5000 mg | Freq: Every day | RESPIRATORY_TRACT | Status: DC

## 2019-08-30 NOTE — Progress Notes (Signed)
Discharge instructions completed with patient and daughter Harriett Sine, Telemetry and IV  D/C. Patient and daughter verbalizes understanding of instructions.

## 2019-08-30 NOTE — Progress Notes (Signed)
Progress Note  Patient Name: Joan Willis Date of Encounter: 08/30/2019  Primary Cardiologist: Kirke Corin  Subjective   Feels back to her baseline. She converted to sinus rhythm at 14:42 on 5/19 with a 6.4 second post termination pause and has maintained sinus rhythm since with heart rates predominantly in the 50s bpm, though has demonstrated appropriate chronotropic response. BP has ranged from the 1-teens to 150s systolic. No chest pain, dyspnea, or palpitations.   Inpatient Medications    Scheduled Meds: . atorvastatin  40 mg Oral Daily  . [START ON 08/31/2019] budesonide (PULMICORT) nebulizer solution  0.5 mg Nebulization Daily  . furosemide  20 mg Oral Daily  . hydrALAZINE  100 mg Oral Q8H  . isosorbide mononitrate  30 mg Oral Daily  . levothyroxine  100 mcg Oral Daily  . lisinopril  40 mg Oral Daily  . metoprolol tartrate  25 mg Oral BID  . montelukast  10 mg Oral Daily  . Rivaroxaban  15 mg Oral Q supper   Continuous Infusions:  PRN Meds: acetaminophen, albuterol, nitroGLYCERIN, ondansetron (ZOFRAN) IV   Vital Signs    Vitals:   08/30/19 0548 08/30/19 0719 08/30/19 0844 08/30/19 1122  BP: (!) 156/54 (!) 172/59  (!) 157/60  Pulse: 60 (!) 55 (!) 51 (!) 51  Resp: 20 17 16 17   Temp: 97.7 F (36.5 C) 98.3 F (36.8 C)  97.6 F (36.4 C)  TempSrc: Oral   Oral  SpO2: 95% 96% 95% 96%  Weight:      Height:        Intake/Output Summary (Last 24 hours) at 08/30/2019 1148 Last data filed at 08/30/2019 1122 Gross per 24 hour  Intake 480 ml  Output 750 ml  Net -270 ml   Filed Weights   08/28/19 2336 08/29/19 0908 08/30/19 0300  Weight: 73.9 kg 72.6 kg 73.5 kg    Telemetry    Afib, 120s bpm with post termination pause of 6.4 seconds at 14:42 on 5/19 followed by sinus - Personally Reviewed  ECG    No new tracings for review - Personally Reviewed  Physical Exam   GEN: No acute distress.   Neck: No JVD. Cardiac: RRR, I/VI systolic murmur RUSB, no rubs, or  gallops.  Respiratory: Clear to auscultation bilaterally.  GI: Soft, nontender, non-distended.   MS: No edema; No deformity. Neuro:  Alert and oriented x 3; Nonfocal.  Psych: Normal affect.  Labs    Chemistry Recent Labs  Lab 08/28/19 2341 08/29/19 0757 08/30/19 0409  NA 139 140 141  K 3.6 3.6 4.1  CL 103 108 107  CO2 25 26 25   GLUCOSE 144* 123* 113*  BUN 32* 27* 33*  CREATININE 1.55* 1.09* 1.55*  CALCIUM 9.6 9.1 9.0  PROT 7.1  --   --   ALBUMIN 3.9  --   --   AST 17  --   --   ALT 15  --   --   ALKPHOS 74  --   --   BILITOT 1.1  --   --   GFRNONAA 30* 46* 30*  GFRAA 35* 53* 35*  ANIONGAP 11 6 9      Hematology Recent Labs  Lab 08/28/19 2341 08/30/19 0409  WBC 9.7 9.6  RBC 4.55 4.24  HGB 13.1 12.4  HCT 40.6 37.5  MCV 89.2 88.4  MCH 28.8 29.2  MCHC 32.3 33.1  RDW 14.1 14.4  PLT 269 245    Cardiac EnzymesNo results for input(s): TROPONINI  in the last 168 hours. No results for input(s): TROPIPOC in the last 168 hours.   BNP Recent Labs  Lab 08/29/19 0418  BNP 216.3*     DDimer No results for input(s): DDIMER in the last 168 hours.   Radiology    DG Chest 1 View  Result Date: 08/29/2019 IMPRESSION: Cardiomegaly with vascular congestion. Diffuse interstitial process, some of which is suspected to be chronic, but cannot exclude mild acute superimposed interstitial edema. Electronically Signed   By: Donavan Foil M.D.   On: 08/29/2019 00:13    Cardiac Studies   2D echo 12/2018: 1. Left ventricular ejection fraction, by visual estimation, is 55 to  60%. The left ventricle has normal function. Normal left ventricular size.  There is no left ventricular hypertrophy.  2. Left ventricular diastolic Doppler parameters are consistent with  pseudonormalization pattern of LV diastolic filling.  3. Global right ventricle has normal systolic function.The right  ventricular size is normal. No increase in right ventricular wall  thickness.  4. Left atrial  size was mildly dilated.  5. Right atrial size was normal.  6. The mitral valve is grossly normal. Mild mitral valve regurgitation.  No evidence of mitral stenosis.  7. The tricuspid valve is grossly normal. Tricuspid valve regurgitation  is mild.  8. The aortic valve is abnormal Aortic valve regurgitation was not  visualized by color flow Doppler. Mild aortic valve stenosis.  9. The pulmonic valve was normal in structure. Pulmonic valve  regurgitation is not visualized by color flow Doppler.  10. Moderately elevated pulmonary artery systolic pressure.  Patient Profile     86 y.o. female with history of PAF on Xarelto, HFpEF, mild aortic stenosis, carotid artery disease status post carotid endarterectomy with details being unclear, hypertension, hyperlipidemia, hypothyroidism, and asthma who we are seeing for Afib with RVR.  Assessment & Plan    1. PAF with RVR: -Converted to sinus rhythm with a bradycardic rate and a noted 6.4 post termination pause -Heart rates at baseline typically run in the mid to upper 50s bpm which has precluded escalation of her beta-blocker as an outpatient -Since converting to sinus rhythm heart rates have predominantly been in the mid 50s bpm with occasional readings in the 60s to 70s bpm range -Recommend carvedilol 6.25 mg twice daily for rate control -Given her CHA2DS2-VASc of at least 6 she will be continued on renally dosed Xarelto -Estimated Creatinine Clearance: 25.7 mL/min (A) (by C-G formula based on SCr of 1.55 mg/dL (H)).  -As an outpatient, recommend referral to EP for evaluation of tachybradycardia syndrome  2.  HTN: -Previously recommended to undergo renal artery ultrasound, this will need to be followed up on as an outpatient -If needed, could titrate isosorbide -Resume carvedilol in place of metoprolol for added BP effect and less effect on heart rate given underlying bradycardic rates -Otherwise, continue PTA medications including  amlodipine, hydralazine, Lasix, and lisinopril  3.  Aortic stenosis: -Mild by most recent echo -Monitor as outpatient  4.  HLD: -PTA Lipitor  5.  HFpEF: -Appears euvolemic and well compensated -Continue PTA Lasix   For questions or updates, please contact [facility] Please consult [email or web address] for contact info under Cardiology/STEMI.    Signed, Christell Faith, PA-C [place] Pager: [phone] 08/30/2019, 11:48 AM

## 2019-08-30 NOTE — Care Management Important Message (Signed)
Important Message  Patient Details  Name: Joan Willis MRN: [id number] Date of Birth: October 23, 1929   Medicare Important Message Given:  Yes  Initial Medicare IM given by Patient Access Associate on 08/30/2019 at 10:13am.     Johnell Comings 08/30/2019, 12:58 PM

## 2019-09-02 NOTE — Discharge Summary (Addendum)
[facility] Discharge Summary   Patient: Joan Willis [id number]  PCP: Leanna Sato, MD  Date of admission: 08/28/2019   Date of discharge: 08/30/2019      Discharge Diagnoses:  Principal Problem:   Paroxysmal atrial fibrillation with rapid ventricular response (HCC) Active Problems:   HTN (hypertension)   Hypothyroidism   Chronic diastolic CHF (congestive heart failure) (HCC)   Elevated troponin   Chest pain, precordial   COPD with chronic bronchitis (HCC)   Chest pain   Atrial fibrillation with RVR (HCC)  Admitted From: home Disposition:  Home   Recommendations for Outpatient Follow-up:  1. PCP: Please follow-up with PCP in 1 week.  Also follow-up with cardiologist recommended 2. Follow up LABS/TEST:  none  Follow-up Information    Leanna Sato, MD. Schedule an appointment as soon as possible for a visit on 09/06/2019.   Specialty: Family Medicine Why: At 2:pm Contact information: [address] [place] [address] [phone]        Iran Ouch, MD. Schedule an appointment as soon as possible for a visit on 09/14/2019.   Specialty: Cardiology Why: At 11:30am Contact information: [address] [place] [address] [phone]          Diet recommendation: Cardiac diet  Activity: The patient is advised to gradually reintroduce usual activities, as tolerated  Discharge Condition: stable  Code Status: Full code   History of present illness: As per the H and P dictated on admission, "Joan Willis is a 86 y.o. female with medical history significant for diastolic heart failure, EF 55-60, HTN,pAF on Xarelto, CKD 3, COPD who presents to the emergency room with sudden onset palpitations associated with chest pressure.  Chest pain is precordial nonradiating of moderate intensity, aggravated with exertion.  No associated nausea, vomiting, diaphoresis or shortness of breath.  Of note, patient was hospitalized and September  2020 with chest pain and had a low risk stress test at that time."  Hospital Course:  Summary of her active problems in the hospital is as following. Paroxysmal atrial fibrillation with RVR Presenting to the hospital with chest tightness shortness of breath in the setting of A. Fib Last episode September 2020 requiring hospitalization, similar circumstances CHA2DS2-VASc of at least 6 Recommend we continue Coreg 6.25 twice daily, Xarelto 15 daily  Essential hypertension Continue amlodipine, hydralazine, lisinopril, Coreg 6.25 twice daily, Imdur 30 If pressure continues to run high could increase Imdur up to twice daily dosing  Chronic diastolic CHF On Lasix 20 daily We will closely monitor renal function, increased creatinine BUN past 24 hours  Aortic valve stenosis Mild, Outpatient monitoring  Patient was ambulatory without any assistance. On the day of the discharge the patient's vitals were stable, and no other acute medical condition were reported by patient. the patient was felt safe to be discharge at Home with no therapy needed on discharge.  Consultants: Cardiology  Procedures: none  Discharge Exam: General: Appear in no distress, no Rash; Oral Mucosa Clear, moist. Cardiovascular: S1 and S2 Present, no Murmur, Respiratory: normal respiratory effort, Bilateral Air entry present and no Crackles, no wheezes Abdomen: Bowel Sound present, Soft and no tenderness, no hernia Extremities: no Pedal edema, no calf tenderness Neurology: alert and oriented to time, place, and person affect appropriate.  Filed Weights   08/28/19 2336 08/29/19 0908 08/30/19 0300  Weight: 73.9 kg 72.6 kg 73.5 kg   Vitals:   08/30/19 1122 08/30/19 1527  BP: (!) 157/60 Marland Kitchen)  147/60  Pulse: (!) 51 (!) 56  Resp: 17 17  Temp: 97.6 F (36.4 C) 98.1 F (36.7 C)  SpO2: 96% 97%    DISCHARGE MEDICATION: Allergies as of 08/30/2019   No Known Allergies     Medication List    TAKE these  medications   acetaminophen 325 MG tablet Commonly known as: TYLENOL Take 325 mg by mouth every 6 (six) hours as needed for moderate pain.   albuterol (2.5 MG/3ML) 0.083% nebulizer solution Commonly known as: PROVENTIL Inhale 2.5 mLs into the lungs every 4 (four) hours as needed for wheezing or shortness of breath.   albuterol 108 (90 Base) MCG/ACT inhaler Commonly known as: VENTOLIN HFA Inhale 2 puffs into the lungs every 6 (six) hours as needed for wheezing or shortness of breath.   amLODipine 10 MG tablet Commonly known as: NORVASC Take 10 mg by mouth daily.   atorvastatin 40 MG tablet Commonly known as: LIPITOR Take 40 mg by mouth daily.   carvedilol 6.25 MG tablet Commonly known as: COREG Take 1 tablet (6.25 mg total) by mouth 2 (two) times daily.   Flovent HFA 220 MCG/ACT inhaler Generic drug: fluticasone Inhale 2 puffs into the lungs daily.   furosemide 20 MG tablet Commonly known as: LASIX Take 20 mg by mouth daily.   hydrALAZINE 100 MG tablet Commonly known as: APRESOLINE TAKE 1 TABLET (100 MG TOTAL) BY MOUTH 3 (THREE) TIMES DAILY.   isosorbide mononitrate 30 MG 24 hr tablet Commonly known as: IMDUR TAKE 1 TABLET BY MOUTH EVERY DAY   levothyroxine 100 MCG tablet Commonly known as: SYNTHROID Take 100 mcg by mouth daily.   lisinopril 40 MG tablet Commonly known as: ZESTRIL Take 40 mg by mouth daily.   montelukast 10 MG tablet Commonly known as: SINGULAIR Take 10 mg by mouth daily.   Rivaroxaban 15 MG Tabs tablet Commonly known as: Xarelto Take 1 tablet (15 mg total) by mouth daily with supper.   Vitamin D3 25 MCG tablet Commonly known as: Vitamin D Take 1,000 Units by mouth daily.      No Known Allergies Discharge Instructions    Amb referral to [facility]   Complete by: As directed    Diet - low sodium heart healthy   Complete by: As directed    Increase activity slowly   Complete by: As directed       The results of significant  diagnostics from this hospitalization (including imaging, microbiology, ancillary and laboratory) are listed below for reference.    Significant Diagnostic Studies: DG Chest 1 View  Result Date: 08/29/2019 CLINICAL DATA:  Heart racing chest tightness EXAM: CHEST  1 VIEW COMPARISON:  12/31/2018, 09/15/2017 FINDINGS: Cardiomegaly with central congestion. Mild diffuse interstitial opacity some of which is suspected to be due to chronic change though superimposed interstitial edema is possible. No pleural effusion. Aortic atherosclerosis. No pneumothorax. IMPRESSION: Cardiomegaly with vascular congestion. Diffuse interstitial process, some of which is suspected to be chronic, but cannot exclude mild acute superimposed interstitial edema. Electronically Signed   By: Donavan Foil M.D.   On: 08/29/2019 00:13    Microbiology: Recent Results (from the past 240 hour(s))  SARS Coronavirus 2 by RT PCR (hospital order, performed in [facility] hospital lab) Nasopharyngeal Nasopharyngeal Swab     Status: None   Collection Time: 08/29/19  3:44 AM   Specimen: Nasopharyngeal Swab  Result Value Ref Range Status   SARS Coronavirus 2 NEGATIVE NEGATIVE Final    Comment: (NOTE) SARS-CoV-2  target nucleic acids are NOT DETECTED. The SARS-CoV-2 RNA is generally detectable in upper and lower respiratory specimens during the acute phase of infection. The lowest concentration of SARS-CoV-2 viral copies this assay can detect is 250 copies / mL. A negative result does not preclude SARS-CoV-2 infection and should not be used as the sole basis for treatment or other patient management decisions.  A negative result may occur with improper specimen collection / handling, submission of specimen other than nasopharyngeal swab, presence of viral mutation(s) within the areas targeted by this assay, and inadequate number of viral copies (<250 copies / mL). A negative result must be combined with clinical observations, patient  history, and epidemiological information. Fact Sheet for Patients:   BoilerBrush.com.cy Fact Sheet for Healthcare Providers: https://pope.com/ This test is not yet approved or cleared  by the [place] FDA and has been authorized for detection and/or diagnosis of SARS-CoV-2 by FDA under an Emergency Use Authorization (EUA).  This EUA will remain in effect (meaning this test can be used) for the duration of the COVID-19 declaration under Section 564(b)(1) of the Act, 21 U.S.C. section 360bbb-3(b)(1), unless the authorization is terminated or revoked sooner. Performed at [facility], [address] [place], [place] [address]      Labs: CBC: Recent Labs  Lab 08/28/19 2341 08/30/19 0409  WBC 9.7 9.6  NEUTROABS 6.7  --   HGB 13.1 12.4  HCT 40.6 37.5  MCV 89.2 88.4  PLT 269 245   Basic Metabolic Panel: Recent Labs  Lab 08/28/19 2341 08/29/19 0757 08/30/19 0409  NA 139 140 141  K 3.6 3.6 4.1  CL 103 108 107  CO2 25 26 25   GLUCOSE 144* 123* 113*  BUN 32* 27* 33*  CREATININE 1.55* 1.09* 1.55*  CALCIUM 9.6 9.1 9.0  MG  --  2.3  --    Liver Function Tests: Recent Labs  Lab 08/28/19 2341  AST 17  ALT 15  ALKPHOS 74  BILITOT 1.1  PROT 7.1  ALBUMIN 3.9   No results for input(s): LIPASE, AMYLASE in the last 168 hours. No results for input(s): AMMONIA in the last 168 hours. Cardiac Enzymes: No results for input(s): CKTOTAL, CKMB, CKMBINDEX, TROPONINI in the last 168 hours. BNP (last 3 results) Recent Labs    12/31/18 2046 08/29/19 0418  BNP 357.0* 216.3*   CBG: No results for input(s): GLUCAP in the last 168 hours.  Time spent: 35 minutes  Signed:  08/31/19  [facility] 08/30/2019 5:23 PM

## 2019-09-14 ENCOUNTER — Other Ambulatory Visit: Payer: Self-pay

## 2019-09-14 ENCOUNTER — Telehealth: Payer: Self-pay | Admitting: Physician Assistant

## 2019-09-14 ENCOUNTER — Ambulatory Visit (INDEPENDENT_AMBULATORY_CARE_PROVIDER_SITE_OTHER): Payer: Medicare Other | Admitting: Physician Assistant

## 2019-09-14 ENCOUNTER — Encounter: Payer: Self-pay | Admitting: Physician Assistant

## 2019-09-14 VITALS — BP 164/70 | HR 56 | Ht 65.0 in | Wt 162.1 lb

## 2019-09-14 DIAGNOSIS — E039 Hypothyroidism, unspecified: Secondary | ICD-10-CM

## 2019-09-14 DIAGNOSIS — N189 Chronic kidney disease, unspecified: Secondary | ICD-10-CM

## 2019-09-14 DIAGNOSIS — I5032 Chronic diastolic (congestive) heart failure: Secondary | ICD-10-CM

## 2019-09-14 DIAGNOSIS — I480 Paroxysmal atrial fibrillation: Secondary | ICD-10-CM

## 2019-09-14 DIAGNOSIS — Q605 Renal hypoplasia, unspecified: Secondary | ICD-10-CM

## 2019-09-14 DIAGNOSIS — I779 Disorder of arteries and arterioles, unspecified: Secondary | ICD-10-CM

## 2019-09-14 DIAGNOSIS — E785 Hyperlipidemia, unspecified: Secondary | ICD-10-CM

## 2019-09-14 DIAGNOSIS — I10 Essential (primary) hypertension: Secondary | ICD-10-CM

## 2019-09-14 DIAGNOSIS — I350 Nonrheumatic aortic (valve) stenosis: Secondary | ICD-10-CM

## 2019-09-14 MED ORDER — ISOSORBIDE MONONITRATE ER 60 MG PO TB24: 60.0000 mg | ORAL_TABLET | Freq: Every day | ORAL | 1 refills | Status: DC

## 2019-09-14 NOTE — Telephone Encounter (Signed)
Call placed to daughter to review today's visit and plan from a cardiac perspective and per patient request.

## 2019-09-14 NOTE — Patient Instructions (Signed)
Medication Instructions:  Your physician has recommended you make the following change in your medication:  INCREASE Imdur to 60 mg daily. An Rx has been sent tot your pharmacy.   *If you need a refill on your cardiac medications before your next appointment, please call your pharmacy*   Lab Work: None ordered If you have labs (blood work) drawn today and your tests are completely normal, you will receive your results only by: Marland Kitchen MyChart Message (if you have MyChart) OR . A paper copy in the mail If you have any lab test that is abnormal or we need to change your treatment, we will call you to review the results.   Testing/Procedures: None ordered   Follow-Up: At [facility], you and your health needs are our priority.  As part of our continuing mission to provide you with exceptional heart care, we have created designated Provider Care Teams.  These Care Teams include your primary Cardiologist (physician) and Advanced Practice Providers (APPs -  Physician Assistants and Nurse Practitioners) who all work together to provide you with the care you need, when you need it.  We recommend signing up for the patient portal called "MyChart".  Sign up information is provided on this After Visit Summary.  MyChart is used to connect with patients for Virtual Visits (Telemedicine).  Patients are able to view lab/test results, encounter notes, upcoming appointments, etc.  Non-urgent messages can be sent to your provider as well.   To learn more about what you can do with MyChart, go to [email or web address].    Your next appointment:   2 week(s)  The format for your next appointment:   In Person  Provider:    You may see Lorine Bears, MD or one of the following Advanced Practice Providers on your designated Care Team:    Nicolasa Ducking, NP  Eula Listen, PA-C  Marisue Ivan, PA-C    Other Instructions Your physician has requested that you regularly monitor and record your  blood pressure readings at home. Please use the same machine at the same time of day to check your readings and record them to bring to your follow-up visit.  Harvel Quale will call this evening around 6 pm to discuss today's visit with your daughter.

## 2019-09-14 NOTE — Progress Notes (Deleted)
Office Visit    Patient Name: Joan Willis Date of Encounter: 09/14/2019  Primary Care Provider:  Leanna Sato, MD Primary Cardiologist:  Lorine Bears, MD  Chief Complaint    Chief Complaint  Patient presents with  . office visit    Hospital F/U; Meds verbally reviewed with patient.    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   Past Medical History    Past Medical History:  Diagnosis Date  . (HFpEF) heart failure with preserved ejection fraction (HCC)    a. TTE 9/20 - EF 55 to 60%, no LVH, diastolic dysfunction, normal RV systolic function, mildly dilated left atrium, mild mitral regurgitation, mild tricuspid regurgitation, mild aortic stenosis, moderately elevated PASP  . Asthma   . Carotid artery disease (HCC)   . Hyperlipemia   . Hypertension   . Hypothyroid   . PAF (paroxysmal atrial fibrillation) (HCC)    a.  Diagnosed 9/20; b. CHADS2VASc 6 (CHF, HTN, age x 2, vascular disease, female); c. on Eliquis   Past Surgical History:  Procedure Laterality Date  . ABDOMINAL HYSTERECTOMY    . APPENDECTOMY    . arm surgery    . cartotid endaractomy      Allergies  No Known Allergies  History of Present Illness   Joan Willis is a 86 y.o. female with PMH as above.   She was admitted to [facility] 07/2017 with acute on chronic diastolic heart failure in the setting of bronchitis and poorly controlled blood pressure.  Echo showed normal LVSF with moderate LVH and G2DD, mild pulmonary hypertension, and aortic sclerosis.    She was hospitalized 12/2018 with chest pain and shortness of breath and noted to be in atrial fibrillation with rapid ventricular response.  Echo with EF 55-60, mild LAE, mild MR/TR/AS,  pseudonormalization pattern of LV diastolic filling, and moderately elevated PASP. MPI showed small defect of mild severity in the apical and anterior location, likely due to breast attenuation and overall ruled a low risk study.    She was seen 01/2019 with a rash attributed to Eliquis and switched to Xarelto.  It was noted she continued to have issues with uncontrolled hypertension.  Carvedilol could not be increased due to bradycardia.   She was seen in the office 07/19/2019 by her primary cardiologist with BP still elevated.  Renal artery duplex was requested given her history of atrophied congenital kidney with one functioning kidney.  On review of EMR, this RAS study has yet been performed.  On 08/28/2019, she presented to [facility] ED with symptoms of chest tightness, SOB, and racing HR like when in Afib back in September 2020.  EKG showed Afib with RVR and ventricular rate 116 bpm, IVCD/ LVH with repolarization abnormality, and prolonged QTC at 525 ms. Also noted was diffuse depression as noted below V2-V3,V4-V6, II, and with elevation in V1/avR. This diffuse depression with V1/avR elevation was noted in the 12/2018 EKG.  Labs significant for hypokalemia with potassium 3.6.  Also noted was AKI / elevated creatinine at 1.55 and BUN 32. HS Tn minimally elevated and flat trending.  LDL 117 with total cholesterol 200.  Chest x-ray showed cardiomegaly with vascular congestion, diffuse interstitial process (some of which was suspected to be chronic but could not exclude  mild acute superimposed interstitial edema).  She converted to NSR 14:42 on 5/19 with a 6.4 second post termination pause and maintained NSR with HR predominantly in the 50s the following day. Appropriate chronotropic response was demonstrated.   Today, she returns to clinic and is doing well from a cardiac standpoint. She is hard of hearing with recommendation provided today that her daughter accompany her at future visits to facilitate care and  patient in agreement. Per patient request, her daughter was also called same day to review today's visit and A/P, as well as to provide any additional information if possible. Today, the patient denies chest pain, palpitations, dyspnea, pnd, orthopnea, n, v, dizziness, syncope, edema, weight gain, or early satiety. No s/sx of bleeding. She reports that she has a brachial BP cuff at home with BP measurements obtained via conversation with daughter as follows: 5/23: 137/53, HR 55; 5/26 153/58, HR 48; 5/27 168/64, HR 55. RAS workup was discussed with both patient and daughter with both in agreement to proceed with this scan, given her continued elevated and labile pressures. Also discussed was previous recommendation for EP referral, also provided today as below. Per patient, she drinks approximately 1 quart of total fluid a day. She denies any salt, caffeine, or alcohol use. She reports a heart healthy diet full of chicken, salad, and fruit/vegetables. She grills or bakes her chicken. Both she and her daughter avoid any canned food. If canned vegetables are purchased, they are rinsed before cooking / eating. BP during today's visit 164/70 with HR 56bpm. She reports medication compliance.   Home Medications    Prior to Admission medications   Medication Sig Start Date End Date Taking? Authorizing Provider  acetaminophen (TYLENOL) 325 MG tablet Take 325 mg by mouth every 6 (six) hours as needed for moderate pain.   Yes [provider]  albuterol (PROVENTIL HFA;VENTOLIN HFA) 108 (90 Base) MCG/ACT inhaler Inhale 2 puffs into the lungs every 6 (six) hours as needed for wheezing or shortness of breath. 10/11/17  Yes Merwyn Katos, MD  albuterol (PROVENTIL) (2.5 MG/3ML) 0.083% nebulizer solution Inhale 2.5 mLs into the lungs every 4 (four) hours as needed for wheezing or shortness of breath.  09/13/17  Yes [provider]  amLODipine (NORVASC) 10 MG tablet Take 10 mg by mouth daily. 05/04/17  Yes  [provider]  atorvastatin (LIPITOR) 40 MG tablet Take 40 mg by mouth daily. 04/18/17  Yes [provider]  carvedilol (COREG) 6.25 MG tablet Take 1 tablet (6.25 mg total) by mouth 2 (two) times daily. 04/20/19  Yes Alver Sorrow, NP  FLOVENT HFA 220 MCG/ACT inhaler Inhale 2 puffs into the lungs daily. 01/17/18  Yes Merwyn Katos, MD  furosemide (LASIX) 20 MG tablet Take 20 mg by mouth daily. 04/29/17  Yes [provider]  hydrALAZINE (APRESOLINE) 100 MG tablet TAKE 1 TABLET (100 MG TOTAL) BY MOUTH 3 (THREE) TIMES DAILY. 04/24/19  Yes Dunn, Raymon Mutton, PA-C  isosorbide mononitrate (IMDUR) 60 MG 24 hr tablet Take 1 tablet (60 mg total) by mouth daily. 09/14/19  Yes Saumya Hukill D, PA-C  levothyroxine (SYNTHROID, LEVOTHROID) 100 MCG tablet Take 100 mcg by mouth daily. 04/29/17  Yes [provider]  lisinopril (PRINIVIL,ZESTRIL) 40 MG tablet Take 40 mg by mouth daily. 06/28/17  Yes [provider]  montelukast (SINGULAIR) 10 MG tablet Take 10 mg by mouth daily. 07/19/17  Yes [provider]  Rivaroxaban (XARELTO) 15 MG TABS tablet Take 1 tablet (  15 mg total) by mouth daily with supper. 01/15/19  Yes Dunn, Raymon Mutton, PA-C  Vitamin D3 (VITAMIN D) 25 MCG tablet Take 1,000 Units by mouth daily.   Yes [provider]    Review of Systems    She denies chest pain, palpitations, dyspnea, pnd, orthopnea, n, v, dizziness, syncope, edema, weight gain, or early satiety.   All other systems reviewed and are otherwise negative except as noted above.  Physical Exam    VS:  BP (!) 164/70 (BP Location: Left Arm, Patient Position: Sitting, Cuff Size: Normal)   Pulse (!) 56   Ht 5\' 5"  (1.651 m)   Wt 162 lb 2 oz (73.5 kg)   SpO2 95%   BMI 26.98 kg/m  , BMI Body mass index is 26.98 kg/m. GEN: Well nourished, well developed, in no acute distress. HEENT: normal. Neck: Supple, no JVD, carotid bruits, or masses. Cardiac: RRR, 1/6 systolic murmur RUSB. No  rubs, or gallops. No clubbing, cyanosis, edema.  Radials/DP/PT 2+ and equal bilaterally.  Respiratory:  Respirations regular and unlabored, clear to auscultation bilaterally. GI: Soft, nontender, nondistended, BS + x 4. MS: no deformity or atrophy. Skin: warm and dry, no rash. Neuro:  Strength and sensation are intact. Psych: Normal affect.  Accessory Clinical Findings    ECG personally reviewed by me today - SB, 56bpm, LAD with IVCD and LVH, TWI I/avL, nonspecific ST changes, improvement in QTc length - no acute changes.  VITALS Reviewed today   Temp Readings from Last 3 Encounters:  08/30/19 98.1 F (36.7 C) (Oral)  01/31/19 (!) 97.4 F (36.3 C)  01/01/19 97.8 F (36.6 C) (Oral)   BP Readings from Last 3 Encounters:  09/14/19 (!) 164/70  08/30/19 (!) 147/60  07/19/19 (!) 170/70   Pulse Readings from Last 3 Encounters:  09/14/19 (!) 56  08/30/19 (!) 56  07/19/19 (!) 56    Wt Readings from Last 3 Encounters:  09/14/19 162 lb 2 oz (73.5 kg)  08/30/19 162 lb (73.5 kg)  07/19/19 163 lb 6 oz (74.1 kg)     LABS  reviewed today   Lab Results  Component Value Date   WBC 9.6 08/30/2019   HGB 12.4 08/30/2019   HCT 37.5 08/30/2019   MCV 88.4 08/30/2019   PLT 245 08/30/2019   Lab Results  Component Value Date   CREATININE 1.55 (H) 08/30/2019   BUN 33 (H) 08/30/2019   NA 141 08/30/2019   K 4.1 08/30/2019   CL 107 08/30/2019   CO2 25 08/30/2019   Lab Results  Component Value Date   ALT 15 08/28/2019   AST 17 08/28/2019   ALKPHOS 74 08/28/2019   BILITOT 1.1 08/28/2019   Lab Results  Component Value Date   CHOL 200 08/29/2019   HDL 63 08/29/2019   LDLCALC 117 (H) 08/29/2019   TRIG 98 08/29/2019   CHOLHDL 3.2 08/29/2019    Lab Results  Component Value Date   HGBA1C 5.6 01/01/2019   Lab Results  Component Value Date   TSH 0.881 08/29/2019     STUDIES/PROCEDURES reviewed today   2D echo 12/2018: 1. Left ventricular ejection fraction, by visual  estimation, is 55 to  60%. The left ventricle has normal function. Normal left ventricular size.  There is no left ventricular hypertrophy.  2. Left ventricular diastolic Doppler parameters are consistent with  pseudonormalization pattern of LV diastolic filling.  3. Global right ventricle has normal systolic function.The right  ventricular size is normal. No  increase in right ventricular wall  thickness.  4. Left atrial size was mildly dilated.  5. Right atrial size was normal.  6. The mitral valve is grossly normal. Mild mitral valve regurgitation.  No evidence of mitral stenosis.  7. The tricuspid valve is grossly normal. Tricuspid valve regurgitation  is mild.  8. The aortic valve is abnormal Aortic valve regurgitation was not  visualized by color flow Doppler. Mild aortic valve stenosis.  9. The pulmonic valve was normal in structure. Pulmonic valve  regurgitation is not visualized by color flow Doppler.  10. Moderately elevated pulmonary artery systolic pressure.   MPI 12/2018 Myoview 12/2018:  There was no ST segment deviation noted during stress.  Defect 1: There is a small defect of mild severity present in the apical anterior and apex location. This is likely due to breast attenuation.  The study is normal.  This is a low risk study.  The left ventricular ejection fraction is normal (55-65%).  Assessment & Plan    Paroxysmal atrial fibrillation with RVR Intolerance to Eliquis on Xarelto --SB on EKG today. Admitted 08/28/19 in Afib with conversion to NSR on metoprolol and a noted 6.4 post-termination pause.  Continue Coreg for rate control. She is asx in sinus bradycardia. Continue [place] with renally dosed Xarelto 15mg  q dinner. CHA2DS2VASc score of at least 6 (CHF, HTN, agex2, vascular, female) with recommendation for long term [place]. Discussed possible EP referral with daughter today with daughter preference to go forward with EP for formal evaluation of  tachybradycardia syndrome and as per recommendation while admitted.     Chronic HFpEF --SOB improved with control of ventricular rate.    --BNP 216.3.  --Echo with nl EF, pseudonormalization of LV diastolic filling, and moderately elevated PASP.   --Euvolemic on exam.  --Continue low-dose Lasix 20mg  qd.    AOCKD --AKI improved with Cr 1.55  1.09 with BUN 32  27.  --Daily BMET.  --RAS study pending as outlined in HPI above.   HLD --LDL 117 with total cholesterol 200. Continue statin. Could consider up-titration for more optimal LDL control now or as an outpatient.   HTN --BP currently well controlled. Continue current medications with BB, Lasix, hydralazine, Imdur, and ACE. Of note, PTA amlodipine is currently held.  Carotid artery dz s/p CEA --Continue Xarelto (in place of ASA) and statin.   Mild AS --Noted by 12/2018 echo. Continue to monitor with periodic echo.   Medication changes: *** Labs ordered: *** Studies / Imaging ordered: *** Future considerations: *** Disposition: ***  Total time spent with patient today *** minutes. This includes reviewing records, evaluating the patient, and coordinating care. Face-to-face time >50%.    Arvil Chaco, PA-C 09/14/2019

## 2019-09-17 ENCOUNTER — Other Ambulatory Visit: Payer: Self-pay

## 2019-09-17 DIAGNOSIS — I10 Essential (primary) hypertension: Secondary | ICD-10-CM

## 2019-09-17 NOTE — Progress Notes (Signed)
Office Visit    Patient Name: Joan Willis Date of Encounter: 09/14/2019  Primary Care Provider:  Leanna Sato, MD Primary Cardiologist:  Lorine Bears, MD  Chief Complaint    Chief Complaint  Patient presents with  . office visit    Hospital F/U; Meds verbally reviewed with patient.    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   Past Medical History    Past Medical History:  Diagnosis Date  . (HFpEF) heart failure with preserved ejection fraction (HCC)    a. TTE 9/20 - EF 55 to 60%, no LVH, diastolic dysfunction, normal RV systolic function, mildly dilated left atrium, mild mitral regurgitation, mild tricuspid regurgitation, mild aortic stenosis, moderately elevated PASP  . Asthma   . Carotid artery disease (HCC)   . Hyperlipemia   . Hypertension   . Hypothyroid   . PAF (paroxysmal atrial fibrillation) (HCC)    a.  Diagnosed 9/20; b. CHADS2VASc 6 (CHF, HTN, age x 2, vascular disease, female); c. on Eliquis   Past Surgical History:  Procedure Laterality Date  . ABDOMINAL HYSTERECTOMY    . APPENDECTOMY    . arm surgery    . cartotid endaractomy      Allergies  No Known Allergies  History of Present Illness   Joan Willis is a 86 y.o. female with PMH as above.   She was admitted to [facility] 07/2017 with acute on chronic diastolic heart failure in the setting of bronchitis and poorly controlled blood pressure.  Echo showed normal LVSF with moderate LVH and G2DD, mild pulmonary hypertension, and aortic sclerosis.    She was hospitalized 12/2018 with chest pain and shortness of breath and noted to be in atrial fibrillation with rapid ventricular response.  Echo with EF 55-60, mild LAE, mild MR/TR/AS,  pseudonormalization pattern of LV diastolic filling, and moderately elevated PASP. MPI showed small defect of mild severity in the apical and anterior location, likely due to breast attenuation and overall ruled a low risk study.    She was seen 01/2019 with a rash attributed to Eliquis and switched to Xarelto.  It was noted she continued to have issues with uncontrolled hypertension.  Carvedilol could not be increased due to bradycardia.   She was seen in the office 07/19/2019 by her primary cardiologist with BP still elevated.  Renal artery duplex was requested given her history of atrophied congenital kidney with one functioning kidney.  On review of EMR, this RAS study has yet been performed.  On 08/28/2019, she presented to [facility] ED with symptoms of chest tightness, SOB, and racing HR like when in Afib back in September 2020.  EKG showed Afib with RVR and ventricular rate 116 bpm, IVCD/ LVH with repolarization abnormality, and prolonged QTC at 525 ms. Also noted was diffuse depression as noted below V2-V3,V4-V6, II, and with elevation in V1/avR. This diffuse depression with V1/avR elevation was noted in the 12/2018 EKG.  Labs significant for hypokalemia with potassium 3.6.  Also noted was AKI / elevated creatinine at 1.55 and BUN 32. HS Tn minimally elevated and flat trending.  LDL 117 with total cholesterol 200.  Chest x-ray showed cardiomegaly with vascular congestion, diffuse interstitial process (some of which was suspected to be chronic but could not exclude  mild acute superimposed interstitial edema).  She converted to NSR 14:42 on 5/19 with a 6.4 second post termination pause and maintained NSR with HR predominantly in the 50s the following day. Appropriate chronotropic response was demonstrated.   Today, she returns to clinic and is doing well from a cardiac standpoint. She is hard of hearing with recommendation provided today that her daughter accompany her at future visits to facilitate care and  patient in agreement. Per patient request, her daughter was also called same day to review today's visit and A/P, as well as to provide any additional information if possible. Today, the patient denies chest pain, palpitations, dyspnea, pnd, orthopnea, n, v, dizziness, syncope, edema, weight gain, or early satiety. No s/sx of bleeding. She reports that she has a brachial BP cuff at home with BP measurements obtained via conversation with daughter as follows: 5/23: 137/53, HR 55; 5/26 153/58, HR 48; 5/27 168/64, HR 55. RAS workup was discussed with both patient and daughter with both in agreement to proceed with this scan, given her continued elevated and labile pressures. Also discussed was previous recommendation for EP referral, also provided today as below. Per patient, she drinks approximately 1 quart of total fluid a day. She denies any salt, caffeine, or alcohol use. She reports a heart healthy diet full of chicken, salad, and fruit/vegetables. She grills or bakes her chicken. Both she and her daughter avoid any canned food. If canned vegetables are purchased, they are rinsed before cooking / eating. BP during today's visit 164/70 with HR 56bpm. She reports medication compliance.   Home Medications    Prior to Admission medications   Medication Sig Start Date End Date Taking? Authorizing Provider  acetaminophen (TYLENOL) 325 MG tablet Take 325 mg by mouth every 6 (six) hours as needed for moderate pain.   Yes [provider]  albuterol (PROVENTIL HFA;VENTOLIN HFA) 108 (90 Base) MCG/ACT inhaler Inhale 2 puffs into the lungs every 6 (six) hours as needed for wheezing or shortness of breath. 10/11/17  Yes Merwyn Katos, MD  albuterol (PROVENTIL) (2.5 MG/3ML) 0.083% nebulizer solution Inhale 2.5 mLs into the lungs every 4 (four) hours as needed for wheezing or shortness of breath.  09/13/17  Yes [provider]  amLODipine (NORVASC) 10 MG tablet Take 10 mg by mouth daily. 05/04/17  Yes  [provider]  atorvastatin (LIPITOR) 40 MG tablet Take 40 mg by mouth daily. 04/18/17  Yes [provider]  carvedilol (COREG) 6.25 MG tablet Take 1 tablet (6.25 mg total) by mouth 2 (two) times daily. 04/20/19  Yes Alver Sorrow, NP  FLOVENT HFA 220 MCG/ACT inhaler Inhale 2 puffs into the lungs daily. 01/17/18  Yes Merwyn Katos, MD  furosemide (LASIX) 20 MG tablet Take 20 mg by mouth daily. 04/29/17  Yes [provider]  hydrALAZINE (APRESOLINE) 100 MG tablet TAKE 1 TABLET (100 MG TOTAL) BY MOUTH 3 (THREE) TIMES DAILY. 04/24/19  Yes Dunn, Raymon Mutton, PA-C  isosorbide mononitrate (IMDUR) 60 MG 24 hr tablet Take 1 tablet (60 mg total) by mouth daily. 09/14/19  Yes Kazuki Ingle D, PA-C  levothyroxine (SYNTHROID, LEVOTHROID) 100 MCG tablet Take 100 mcg by mouth daily. 04/29/17  Yes [provider]  lisinopril (PRINIVIL,ZESTRIL) 40 MG tablet Take 40 mg by mouth daily. 06/28/17  Yes [provider]  montelukast (SINGULAIR) 10 MG tablet Take 10 mg by mouth daily. 07/19/17  Yes [provider]  Rivaroxaban (XARELTO) 15 MG TABS tablet Take 1 tablet (  15 mg total) by mouth daily with supper. 01/15/19  Yes Dunn, Raymon Mutton, PA-C  Vitamin D3 (VITAMIN D) 25 MCG tablet Take 1,000 Units by mouth daily.   Yes [provider]    Review of Systems    She denies chest pain, palpitations, dyspnea, pnd, orthopnea, n, v, dizziness, syncope, edema, weight gain, or early satiety.   All other systems reviewed and are otherwise negative except as noted above.  Physical Exam    VS:  BP (!) 164/70 (BP Location: Left Arm, Patient Position: Sitting, Cuff Size: Normal)   Pulse (!) 56   Ht 5\' 5"  (1.651 m)   Wt 162 lb 2 oz (73.5 kg)   SpO2 95%   BMI 26.98 kg/m  , BMI Body mass index is 26.98 kg/m. GEN: Well nourished, well developed, in no acute distress. HEENT: normal. Neck: Supple, no JVD, carotid bruits, or masses. Cardiac: RRR, 1/6 systolic murmur RUSB. No  rubs, or gallops. No clubbing, cyanosis, edema.  Radials/DP/PT 2+ and equal bilaterally.  Respiratory:  Respirations regular and unlabored, clear to auscultation bilaterally. GI: Soft, nontender, nondistended, BS + x 4. MS: no deformity or atrophy. Skin: warm and dry, no rash. Neuro:  Strength and sensation are intact. Psych: Normal affect.  Accessory Clinical Findings    ECG personally reviewed by me today - SB, 56bpm, LAD with IVCD and LVH, TWI I/avL, nonspecific ST changes, improvement in QTc length - no acute changes.  VITALS Reviewed today   Temp Readings from Last 3 Encounters:  08/30/19 98.1 F (36.7 C) (Oral)  01/31/19 (!) 97.4 F (36.3 C)  01/01/19 97.8 F (36.6 C) (Oral)   BP Readings from Last 3 Encounters:  09/14/19 (!) 164/70  08/30/19 (!) 147/60  07/19/19 (!) 170/70   Pulse Readings from Last 3 Encounters:  09/14/19 (!) 56  08/30/19 (!) 56  07/19/19 (!) 56    Wt Readings from Last 3 Encounters:  09/14/19 162 lb 2 oz (73.5 kg)  08/30/19 162 lb (73.5 kg)  07/19/19 163 lb 6 oz (74.1 kg)     LABS  reviewed today   Lab Results  Component Value Date   WBC 9.6 08/30/2019   HGB 12.4 08/30/2019   HCT 37.5 08/30/2019   MCV 88.4 08/30/2019   PLT 245 08/30/2019   Lab Results  Component Value Date   CREATININE 1.55 (H) 08/30/2019   BUN 33 (H) 08/30/2019   NA 141 08/30/2019   K 4.1 08/30/2019   CL 107 08/30/2019   CO2 25 08/30/2019   Lab Results  Component Value Date   ALT 15 08/28/2019   AST 17 08/28/2019   ALKPHOS 74 08/28/2019   BILITOT 1.1 08/28/2019   Lab Results  Component Value Date   CHOL 200 08/29/2019   HDL 63 08/29/2019   LDLCALC 117 (H) 08/29/2019   TRIG 98 08/29/2019   CHOLHDL 3.2 08/29/2019    Lab Results  Component Value Date   HGBA1C 5.6 01/01/2019   Lab Results  Component Value Date   TSH 0.881 08/29/2019     STUDIES/PROCEDURES reviewed today   2D echo 12/2018: 1. Left ventricular ejection fraction, by visual  estimation, is 55 to  60%. The left ventricle has normal function. Normal left ventricular size.  There is no left ventricular hypertrophy.  2. Left ventricular diastolic Doppler parameters are consistent with  pseudonormalization pattern of LV diastolic filling.  3. Global right ventricle has normal systolic function.The right  ventricular size is normal. No  increase in right ventricular wall  thickness.  4. Left atrial size was mildly dilated.  5. Right atrial size was normal.  6. The mitral valve is grossly normal. Mild mitral valve regurgitation.  No evidence of mitral stenosis.  7. The tricuspid valve is grossly normal. Tricuspid valve regurgitation  is mild.  8. The aortic valve is abnormal Aortic valve regurgitation was not  visualized by color flow Doppler. Mild aortic valve stenosis.  9. The pulmonic valve was normal in structure. Pulmonic valve  regurgitation is not visualized by color flow Doppler.  10. Moderately elevated pulmonary artery systolic pressure.   MPI 12/2018 Myoview 12/2018:  There was no ST segment deviation noted during stress.  Defect 1: There is a small defect of mild severity present in the apical anterior and apex location. This is likely due to breast attenuation.  The study is normal.  This is a low risk study.  The left ventricular ejection fraction is normal (55-65%).  Assessment & Plan    Paroxysmal atrial fibrillation with RVR Intolerance to Eliquis on Xarelto --SB on EKG today. Admitted 08/28/19 in Afib with conversion to NSR on metoprolol and a noted 6.4 post-termination pause.  Continue Coreg for rate control. She is asx in sinus bradycardia. Continue [place] with renally dosed Xarelto 15mg  q dinner. CHA2DS2VASc score of at least 6 (CHF, HTN, agex2, vascular, female) with recommendation for long term [place]. As recommended by the rounding cardiology team during her admission, discussed EP referral with daughter today for evaluation of  tachybradycardia syndrome with daughter agreement to move forward with this referral. Of note, she does deny and dizziness/presyncope or syncope.   HTN --BP elevated today at 164/70. See HPI for home BP readings, collected by phone call with daughter. Continue current Coreg, amlodipine, Lasix, hydralazine, and lisinopril. Will increase Imdur to 60mg  daily for additional BP support. Instructions provided to monitor BP and HR closely at home and call the office if increased Imdur is not well tolerated. Given history of labile and difficult to control BP on several BP medications, reviewed previous recommendation for RAS / renal ultrasound with both patient and daughter today and will reorder this scan today for further evaluation. Continue current low sodium and heart healthy diet.   Chronic HFpEF --Denies s/sx of worsening heart failure. Echo with nl EF, pseudonormalization of LV diastolic filling, and moderately elevated PASP.  Euvolemic on exam. Continue low-dose Lasix 20mg  qd and lisinopril.    HLD --LDL 117 with total cholesterol 200. Continue statin. Could consider up-titration at RTC for more optimal LDL control given carotid artery dz as below.   Carotid artery dz s/p CEA --Continue Xarelto (in place of ASA) and statin. Consider up-titration of statin at RTC as above.   Mild AS --Noted by 12/2018 echo. Continue to monitor with periodic echo.   Medication changes: Imdur increased to 60mg  daily Labs ordered: None. Studies / Imaging ordered: Reordered RAS / renal ultrasound Future considerations: Repeat lipids Disposition: RTC 2 weeks for BP recheck   Arvil Chaco, PA-C 09/14/2019

## 2019-09-27 NOTE — Progress Notes (Signed)
ELECTROPHYSIOLOGY CONSULT NOTE  Patient ID: Joan Willis, MRN: [id number], DOB/AGE: March 26, 1930 86 y.o. Admit date: (Not on file) Date of Consult: 09/28/2019  Primary Physician: Marguerita Merles, MD Primary Cardiologist: Joan Willis is a 86 y.o. female who is being seen today for the evaluation of afib with post termination pauses at the request of Joan.    HPI Joan Willis is a 86 y.o. female referred because of atrial fibrillation associated with hospitalizations for acute decompensated diastolic heart failure.  Recently hospitalized 5/21 and 9/20 with chest pain sob and AFib with RVR; these episodes seem to be quite rapidly triggering her decompensation, i.e. less than about 2 hours.  Symptoms include associated chest discomfort/tightness.  While in sinus rhythm she denies shortness of breath or chest tightness.  She has had no peripheral edema nocturnal dyspnea or orthopnea.   A note describes post termination pause of > 6 sec but I am unable to find in epic    DATE TEST EF   9/20 MYOVIEW   55-65 % Small perfusion defect ? Breast artifact  9/20 Echo   55-60 %         Date Cr K Hgb  5/21 1.55 4.1 12.4         Thromboembolic risk factors ( age  -2, HTN-1, DM-1, Vasc disease -1, Gender-1) for a CHADSVASc Score of >=6    Past Medical History:  Diagnosis Date  . (HFpEF) heart failure with preserved ejection fraction ([place])    a. TTE 9/20 - EF 55 to 40%, no LVH, diastolic dysfunction, normal RV systolic function, mildly dilated left atrium, mild mitral regurgitation, mild tricuspid regurgitation, mild aortic stenosis, moderately elevated PASP  . Asthma   . Carotid artery disease ([place])   . Hyperlipemia   . Hypertension   . Hypothyroid   . PAF (paroxysmal atrial fibrillation) ([place])    a.  Diagnosed 9/20; b. CHADS2VASc 6 (CHF, HTN, age x 2, vascular disease, female); c. on Eliquis      Surgical History:  Past Surgical History:  Procedure Laterality Date    . ABDOMINAL HYSTERECTOMY    . APPENDECTOMY    . arm surgery    . cartotid endaractomy       Home Meds: Current Meds  Medication Sig  . acetaminophen (TYLENOL) 325 MG tablet Take 325 mg by mouth every 6 (six) hours as needed for moderate pain.  Marland Kitchen albuterol (PROVENTIL HFA;VENTOLIN HFA) 108 (90 Base) MCG/ACT inhaler Inhale 2 puffs into the lungs every 6 (six) hours as needed for wheezing or shortness of breath.  Marland Kitchen albuterol (PROVENTIL) (2.5 MG/3ML) 0.083% nebulizer solution Inhale 2.5 mLs into the lungs every 4 (four) hours as needed for wheezing or shortness of breath.   Marland Kitchen amLODipine (NORVASC) 10 MG tablet Take 10 mg by mouth daily.  Marland Kitchen atorvastatin (LIPITOR) 40 MG tablet Take 40 mg by mouth daily.  . carvedilol (COREG) 6.25 MG tablet Take 1 tablet (6.25 mg total) by mouth 2 (two) times daily.  Marland Kitchen FLOVENT HFA 220 MCG/ACT inhaler Inhale 2 puffs into the lungs daily.  . furosemide (LASIX) 20 MG tablet Take 20 mg by mouth daily.  . hydrALAZINE (APRESOLINE) 100 MG tablet TAKE 1 TABLET (100 MG TOTAL) BY MOUTH 3 (THREE) TIMES DAILY.  . isosorbide mononitrate (IMDUR) 60 MG 24 hr tablet Take 1 tablet (60 mg total) by mouth daily.  Marland Kitchen levothyroxine (SYNTHROID, LEVOTHROID) 100 MCG tablet Take 100  mcg by mouth daily.  Marland Kitchen lisinopril (PRINIVIL,ZESTRIL) 40 MG tablet Take 40 mg by mouth daily.  . montelukast (SINGULAIR) 10 MG tablet Take 10 mg by mouth daily.  . Rivaroxaban (XARELTO) 15 MG TABS tablet Take 1 tablet (15 mg total) by mouth daily with supper.  . Vitamin D3 (VITAMIN D) 25 MCG tablet Take 1,000 Units by mouth daily.    Allergies: No Known Allergies  Social History   Socioeconomic History  . Marital status: Widowed    Spouse name: Not on file  . Number of children: Not on file  . Years of education: Not on file  . Highest education level: Not on file  Occupational History  . Not on file  Tobacco Use  . Smoking status: Never Smoker  . Smokeless tobacco: Never Used  . Tobacco comment:  pt states she has never smoked  Vaping Use  . Vaping Use: Never used  Substance and Sexual Activity  . Alcohol use: Never  . Drug use: Never  . Sexual activity: Not on file  Other Topics Concern  . Not on file  Social History Narrative  . Not on file   Social Determinants of Health   Financial Resource Strain:   . Difficulty of Paying Living Expenses:   Food Insecurity:   . Worried About Programme researcher, broadcasting/film/video in the Last Year:   . Barista in the Last Year:   Transportation Needs:   . Freight forwarder (Medical):   Marland Kitchen Lack of Transportation (Non-Medical):   Physical Activity:   . Days of Exercise per Week:   . Minutes of Exercise per Session:   Stress:   . Feeling of Stress :   Social Connections:   . Frequency of Communication with Friends and Family:   . Frequency of Social Gatherings with Friends and Family:   . Attends Religious Services:   . Active Member of Clubs or Organizations:   . Attends Banker Meetings:   Marland Kitchen Marital Status:   Intimate Partner Violence:   . Fear of Current or Ex-Partner:   . Emotionally Abused:   Marland Kitchen Physically Abused:   . Sexually Abused:      Family History  Problem Relation Age of Onset  . Hypertension Mother      ROS:  Please see the history of present illness.     All other systems reviewed and negative.    Physical Exam: Blood pressure (!) 152/52, pulse (!) 55, height 5\' 5"  (1.651 m), weight 161 lb (73 kg), SpO2 97 %. General: Well developed, well nourished female in no acute distress. Head: Normocephalic, atraumatic, sclera non-icteric, no xanthomas, nares are without discharge. EENT: normal  Lymph Nodes:  none Neck: Negative for carotid bruits. JVD 8 Back:with kyphosis  Lungs: Clear bilaterally to auscultation without wheezes, rales, or rhonchi. Breathing is unlabored. Heart: RRR with S1 S2.  2/6 systolic murmur . No rubs, or gallops appreciated. Abdomen: Soft, non-tender, non-distended with normoactive  bowel sounds. No hepatomegaly. No rebound/guarding. No obvious abdominal masses. Msk:  Strength and tone appear normal for age. Extremities: No clubbing or cyanosis. No  edema.  Distal pedal pulses are 2+ and equal bilaterally. Skin: Warm and Dry Neuro: Alert and oriented X 3. CN III-XII intact Grossly normal sensory and motor function . Psych:  Responds to questions appropriately with a normal affect.      Labs: Cardiac Enzymes No results for input(s): CKTOTAL, CKMB, TROPONINI in the last 72 hours. CBC  Lab Results  Component Value Date   WBC 9.6 08/30/2019   HGB 12.4 08/30/2019   HCT 37.5 08/30/2019   MCV 88.4 08/30/2019   PLT 245 08/30/2019   PROTIME: No results for input(s): LABPROT, INR in the last 72 hours. Chemistry No results for input(s): NA, K, CL, CO2, BUN, CREATININE, CALCIUM, PROT, BILITOT, ALKPHOS, ALT, AST, GLUCOSE in the last 168 hours.  Invalid input(s): LABALBU Lipids Lab Results  Component Value Date   CHOL 200 08/29/2019   HDL 63 08/29/2019   LDLCALC 117 (H) 08/29/2019   TRIG 98 08/29/2019   BNP No results found for: PROBNP Thyroid Function Tests: No results for input(s): TSH, T4TOTAL, T3FREE, THYROIDAB in the last 72 hours.  Invalid input(s): FREET3 Miscellaneous No results found for: DDIMER  Radiology/Studies:  No results found.  EKG: sinus @ 55 19/11/48 LVH   Assessment and Plan:  Atrial fibrillation-p persistent  Sinus bradycardia  ?  Posttermination pause  Congestive heart failure-chronic-diastolic  Hypertension-severe   The patient has recurrent episodes of atrial fibrillation which in relatively short order trigger acute decompensated diastolic heart failure.  We discussed strategies of intermittent rate control augmentation, chronic augmentation limited by her sinus bradycardia as well as long-term antiarrhythmic therapy.  The interlude of 8 months is really very long to measure efficacy, but she and her daughter both preferred  this option as opposed to augmented rate control with a recurrent event.  Drug options included flecainide or propafenone to be used in conjunction with her current rate control or dofetilide; amiodarone might also be effective but might aggravate her sinus bradycardia.  After discussions of side effects/risks and benefits she and her daughter have elected to pursue flecainide.  We will begin at 50 mg twice daily and anticipate an ECG with her next visit in about 3 weeks.  Reviewed chart reports a posttermination pause but I was not able to find this documentation.  She has had no symptoms of lightheadedness or presyncope; we will follow this along.  She is euvolemic  On Anticoagulation;  No bleeding issues   She has severe BP issues, taking 6 drugs  She is scheduled for renal vascular ultrasound, which I encouraged her to follow through with although I think the yield is probably very low.     Sherryl Manges

## 2019-09-28 ENCOUNTER — Other Ambulatory Visit: Payer: Self-pay

## 2019-09-28 ENCOUNTER — Ambulatory Visit (INDEPENDENT_AMBULATORY_CARE_PROVIDER_SITE_OTHER): Payer: Medicare Other | Admitting: Internal Medicine

## 2019-09-28 ENCOUNTER — Encounter: Payer: Self-pay | Admitting: Internal Medicine

## 2019-09-28 ENCOUNTER — Ambulatory Visit: Payer: Medicare Other | Admitting: Family

## 2019-09-28 VITALS — BP 152/52 | HR 55 | Ht 65.0 in | Wt 161.0 lb

## 2019-09-28 DIAGNOSIS — I480 Paroxysmal atrial fibrillation: Secondary | ICD-10-CM

## 2019-09-28 DIAGNOSIS — I495 Sick sinus syndrome: Secondary | ICD-10-CM

## 2019-09-28 MED ORDER — FLECAINIDE ACETATE 50 MG PO TABS
50.0000 mg | ORAL_TABLET | Freq: Two times a day (BID) | ORAL | 6 refills | Status: DC
Start: 2019-09-28 — End: 2020-01-10

## 2019-09-28 NOTE — Patient Instructions (Signed)
Medication Instructions:  - Your physician has recommended you make the following change in your medication:   1) Start flecainide 50 mg- take 1 tablet by mouth twice daily   *If you need a refill on your cardiac medications before your next appointment, please call your pharmacy*   Lab Work: - none ordered  If you have labs (blood work) drawn today and your tests are completely normal, you will receive your results only by: Marland Kitchen MyChart Message (if you have MyChart) OR . A paper copy in the mail If you have any lab test that is abnormal or we need to change your treatment, we will call you to review the results.   Testing/Procedures: - none ordered   Follow-Up: At [facility], you and your health needs are our priority.  As part of our continuing mission to provide you with exceptional heart care, we have created designated Provider Care Teams.  These Care Teams include your primary Cardiologist (physician) and Advanced Practice Providers (APPs -  Physician Assistants and Nurse Practitioners) who all work together to provide you with the care you need, when you need it.  We recommend signing up for the patient portal called "MyChart".  Sign up information is provided on this After Visit Summary.  MyChart is used to connect with patients for Virtual Visits (Telemedicine).  Patients are able to view lab/test results, encounter notes, upcoming appointments, etc.  Non-urgent messages can be sent to your provider as well.   To learn more about what you can do with MyChart, go to [email or web address].    Your next appointment:   1) 3 months with Dr. Caryl Comes  2) as scheduled with Dr. Fletcher Anon  The format for your next appointment:   In Person  Provider:   as above   Other Instructions  Flecainide tablets What is this medicine? FLECAINIDE (FLEK a nide) is an antiarrhythmic drug. This medicine is used to prevent irregular heart rhythm. It can also slow down fast heartbeats called  tachycardia. This medicine may be used for other purposes; ask your health care provider or pharmacist if you have questions. COMMON BRAND NAME(S): Tambocor What should I tell my health care provider before I take this medicine? They need to know if you have any of these conditions:  abnormal levels of potassium in the blood  heart disease including heart rhythm and heart rate problems  kidney or liver disease  recent heart attack  an unusual or allergic reaction to flecainide, local anesthetics, other medicines, foods, dyes, or preservatives  pregnant or trying to get pregnant  breast-feeding How should I use this medicine? Take this medicine by mouth with a glass of water. Follow the directions on the prescription label. You can take this medicine with or without food. Take your doses at regular intervals. Do not take your medicine more often than directed. Do not stop taking this medicine suddenly. This may cause serious, heart-related side effects. If your doctor wants you to stop the medicine, the dose may be slowly lowered over time to avoid any side effects. Talk to your pediatrician regarding the use of this medicine in children. While this drug may be prescribed for children as young as 1 year of age for selected conditions, precautions do apply. Overdosage: If you think you have taken too much of this medicine contact a poison control center or emergency room at once. NOTE: This medicine is only for you. Do not share this medicine with others. What if I miss a  dose? If you miss a dose, take it as soon as you can. If it is almost time for your next dose, take only that dose. Do not take double or extra doses. What may interact with this medicine? Do not take this medicine with any of the following medications:  amoxapine  arsenic trioxide  certain antibiotics like clarithromycin, erythromycin, gatifloxacin, gemifloxacin, levofloxacin, moxifloxacin, sparfloxacin, or  troleandomycin  certain antidepressants called tricyclic antidepressants like amitriptyline, imipramine, or nortriptyline  certain medicines to control heart rhythm like disopyramide, encainide, moricizine, procainamide, propafenone, and quinidine  cisapride  delavirdine  droperidol  haloperidol  hawthorn  imatinib  levomethadyl  maprotiline  medicines for malaria like chloroquine and halofantrine  pentamidine  phenothiazines like chlorpromazine, mesoridazine, prochlorperazine, thioridazine  pimozide  quinine  ranolazine  ritonavir  sertindole This medicine may also interact with the following medications:  cimetidine  dofetilide  medicines for angina or high blood pressure  medicines to control heart rhythm like amiodarone and digoxin  ziprasidone This list may not describe all possible interactions. Give your health care provider a list of all the medicines, herbs, non-prescription drugs, or dietary supplements you use. Also tell them if you smoke, drink alcohol, or use illegal drugs. Some items may interact with your medicine. What should I watch for while using this medicine? Visit your doctor or health care professional for regular checks on your progress. Because your condition and the use of this medicine carries some risk, it is a good idea to carry an identification card, necklace or bracelet with details of your condition, medications and doctor or health care professional. Check your blood pressure and pulse rate regularly. Ask your health care professional what your blood pressure and pulse rate should be, and when you should contact him or her. Your doctor or health care professional also may schedule regular blood tests and electrocardiograms to check your progress. You may get drowsy or dizzy. Do not drive, use machinery, or do anything that needs mental alertness until you know how this medicine affects you. Do not stand or sit up quickly,  especially if you are an older patient. This reduces the risk of dizzy or fainting spells. Alcohol can make you more dizzy, increase flushing and rapid heartbeats. Avoid alcoholic drinks. What side effects may I notice from receiving this medicine? Side effects that you should report to your doctor or health care professional as soon as possible:  chest pain, continued irregular heartbeats  difficulty breathing  swelling of the legs or feet  trembling, shaking  unusually weak or tired Side effects that usually do not require medical attention (report to your doctor or health care professional if they continue or are bothersome):  blurred vision  constipation  headache  nausea, vomiting  stomach pain This list may not describe all possible side effects. Call your doctor for medical advice about side effects. You may report side effects to FDA at 1-800-FDA-1088. Where should I keep my medicine? Keep out of the reach of children. Store at room temperature between 15 and 30 degrees C (59 and 86 degrees F). Protect from light. Keep container tightly closed. Throw away any unused medicine after the expiration date. NOTE: This sheet is a summary. It may not cover all possible information. If you have questions about this medicine, talk to your doctor, pharmacist, or health care provider.  2020 Elsevier/Gold Standard (2018-03-20 11:41:38)

## 2019-10-19 ENCOUNTER — Other Ambulatory Visit: Payer: Self-pay

## 2019-10-19 ENCOUNTER — Ambulatory Visit (INDEPENDENT_AMBULATORY_CARE_PROVIDER_SITE_OTHER): Payer: Medicare Other

## 2019-10-19 DIAGNOSIS — I10 Essential (primary) hypertension: Secondary | ICD-10-CM

## 2019-10-22 ENCOUNTER — Telehealth: Payer: Self-pay

## 2019-10-22 DIAGNOSIS — I701 Atherosclerosis of renal artery: Secondary | ICD-10-CM

## 2019-10-22 DIAGNOSIS — R7989 Other specified abnormal findings of blood chemistry: Secondary | ICD-10-CM

## 2019-10-22 NOTE — Telephone Encounter (Signed)
-----   Message from Iran Ouch, MD sent at 10/22/2019  8:30 AM EDT ----- No need to refer to vascular surgery.  Schedule a follow-up appointment with me.  I can handle her vascular issues.  ----- Message ----- From: Lennon Alstrom, PA-C Sent: 10/19/2019   4:34 PM EDT To: Duke Salvia, MD, Iran Ouch, MD, #  Please let the patient know that her renal artery scan was abnormal. Her scan showed stenosis (or narrowing) of her celiac, superior mesenteric, and bilateral renal arteries. The renal arteries supply blood to the kidneys, so narrowing here is often associated with difficulty in controlling blood pressure. The celiac and superior mesenteric arteries supply blood to the upper abdomen, so narrowing here can be associated with abdominal pain +/- GI symptoms. She also had abnormal size and thickening of both kidneys, as well as R kidney cysts.   Recommendations: (1) Refer to vascular surgery - this is for a consult regarding her stenosis and recommendations for intervention.  (2) Refer to nephrology - this is just so that she can establish care and monitor her renal function.

## 2019-10-22 NOTE — Telephone Encounter (Addendum)
Spoke with the patient. Patient made aware of lab results and Marisue Ivan, PA and Dr. Jari Sportsman recommendation.  Appt with Dr. Kirke Corin scheduled for 11/06/19 @ 1:20pm. Adv the patient that I will place the referral to [facility] and that their office will call her directly to schedule the appt.  Patient is agreeable with the plan of care and voiced appreciation for the call.

## 2019-11-06 ENCOUNTER — Ambulatory Visit: Payer: Medicare Other | Admitting: Cardiovascular Disease

## 2019-11-08 ENCOUNTER — Telehealth: Payer: Self-pay

## 2019-11-08 NOTE — Telephone Encounter (Signed)
Type Date User Summary Attachment  General 11/07/2019 2:44 PM Bumgarner, Georges Mouse: Referral message -  Note   ----- Message ----- From: Loman Chroman Sent: 11/07/2019   2:43 PM EDT To: Jarvis Newcomer, RN, [facility] Triage Subject: unable to contact                              Referring provider has reached out 3x now as well as [facility] reached out 3x and patient has not responded to any communication. Referral will be closed now.         . Type Date User Summary Attachment  Provider Comments 10/22/2019 10:06 AM Parris-Godley, Monia Sabal, RN Provider Comments -  Note   Dx elevated creatinine, renal artery stenosis        Referral Order  Order  Ambulatory referral to Nephrology (Order # [id number]) on 10/22/2019  View Encounter

## 2020-01-10 ENCOUNTER — Encounter: Payer: Self-pay | Admitting: Internal Medicine

## 2020-01-10 ENCOUNTER — Other Ambulatory Visit: Payer: Self-pay

## 2020-01-10 ENCOUNTER — Ambulatory Visit (INDEPENDENT_AMBULATORY_CARE_PROVIDER_SITE_OTHER): Payer: Medicare Other | Admitting: Internal Medicine

## 2020-01-10 VITALS — BP 120/52 | HR 51 | Ht 65.0 in | Wt 162.0 lb

## 2020-01-10 DIAGNOSIS — I4819 Other persistent atrial fibrillation: Secondary | ICD-10-CM | POA: Diagnosis not present

## 2020-01-10 DIAGNOSIS — R001 Bradycardia, unspecified: Secondary | ICD-10-CM

## 2020-01-10 DIAGNOSIS — I5032 Chronic diastolic (congestive) heart failure: Secondary | ICD-10-CM

## 2020-01-10 DIAGNOSIS — I10 Essential (primary) hypertension: Secondary | ICD-10-CM | POA: Diagnosis not present

## 2020-01-10 MED ORDER — FLECAINIDE ACETATE 50 MG PO TABS
25.0000 mg | ORAL_TABLET | Freq: Two times a day (BID) | ORAL | Status: DC
Start: 2020-01-10 — End: 2020-03-20

## 2020-01-10 NOTE — Patient Instructions (Addendum)
Medication Instructions:  - Your physician has recommended you make the following change in your medication:   1) DECREASE flecainide 50 mg: - take 0.5 tablet (25 mg) by mouth twice daily   *If you need a refill on your cardiac medications before your next appointment, please call your pharmacy*   Lab Work: - none ordered  If you have labs (blood work) drawn today and your tests are completely normal, you will receive your results only by: Marland Kitchen MyChart Message (if you have MyChart) OR . A paper copy in the mail If you have any lab test that is abnormal or we need to change your treatment, we will call you to review the results.   Testing/Procedures:  1) EKG- in about 10 days: Monday 01/21/20: arrive at 9:15 am - come to the Medical Mall entrance at [facility] - 1st desk on the right to check in past the screening table   Follow-Up: At [facility], you and your health needs are our priority.  As part of our continuing mission to provide you with exceptional heart care, we have created designated Provider Care Teams.  These Care Teams include your primary Cardiologist (physician) and Advanced Practice Providers (APPs -  Physician Assistants and Nurse Practitioners) who all work together to provide you with the care you need, when you need it.  We recommend signing up for the patient portal called "MyChart".  Sign up information is provided on this After Visit Summary.  MyChart is used to connect with patients for Virtual Visits (Telemedicine).  Patients are able to view lab/test results, encounter notes, upcoming appointments, etc.  Non-urgent messages can be sent to your provider as well.   To learn more about what you can do with MyChart, go to [email or web address].    Your next appointment:   4 month(s)  The format for your next appointment:   In Person  Provider:   Sherryl Manges, MD   Other Instructions

## 2020-01-10 NOTE — Progress Notes (Signed)
.      Patient Care Team: Leanna Sato, MD as PCP - General (Family Medicine) Iran Ouch, MD as PCP - Cardiology (Cardiology)   HPI  Joan Willis is a 86 y.o. female Seen in follow-up for intermittent atrial fibrillation which have triggered decompensated diastolic heart failure.  It was elected to try her on flecainide for rhythm suppression There is also a report the documentation of which I could not find of a posttermination pause Also has sinus bradycardia which prompted [place] to avoid amiodarone because of concerns of pacing.  She has no complaints. No chest pain or lightheadedness. No edema. Tolerating the drug.   DATE TEST EF   9/20 MYOVIEW   55-65 % Small perfusion defect ? Breast artifact  9/20 Echo   55-60 % AS Mild (mean Grad 14)         Date Cr K Hgb  5/21 1.55 4.1 12.4         DATE PR interval QRSduration Dose  6/21*  191 112 0  9/21 262 152 50     Thromboembolic risk factors ( age  -2, HTN-1, DM-1, Vasc disease -1, Gender-1) for a CHADSVASc Score of >=6     Records and Results Reviewed   Past Medical History:  Diagnosis Date  . (HFpEF) heart failure with preserved ejection fraction (HCC)    a. TTE 9/20 - EF 55 to 60%, no LVH, diastolic dysfunction, normal RV systolic function, mildly dilated left atrium, mild mitral regurgitation, mild tricuspid regurgitation, mild aortic stenosis, moderately elevated PASP  . Asthma   . Carotid artery disease (HCC)   . Hyperlipemia   . Hypertension   . Hypothyroid   . PAF (paroxysmal atrial fibrillation) (HCC)    a.  Diagnosed 9/20; b. CHADS2VASc 6 (CHF, HTN, age x 2, vascular disease, female); c. on Eliquis    Past Surgical History:  Procedure Laterality Date  . ABDOMINAL HYSTERECTOMY    . APPENDECTOMY    . arm surgery    . cartotid endaractomy      Current Meds  Medication Sig  . acetaminophen (TYLENOL) 325 MG tablet Take 325 mg by mouth every 6 (six) hours as needed for moderate  pain.  Marland Kitchen albuterol (PROVENTIL HFA;VENTOLIN HFA) 108 (90 Base) MCG/ACT inhaler Inhale 2 puffs into the lungs every 6 (six) hours as needed for wheezing or shortness of breath.  Marland Kitchen albuterol (PROVENTIL) (2.5 MG/3ML) 0.083% nebulizer solution Inhale 2.5 mLs into the lungs every 4 (four) hours as needed for wheezing or shortness of breath.   Marland Kitchen amLODipine (NORVASC) 10 MG tablet Take 10 mg by mouth daily.  Marland Kitchen atorvastatin (LIPITOR) 40 MG tablet Take 40 mg by mouth daily.  . carvedilol (COREG) 6.25 MG tablet Take 1 tablet (6.25 mg total) by mouth 2 (two) times daily.  . flecainide (TAMBOCOR) 50 MG tablet Take 1 tablet (50 mg total) by mouth 2 (two) times daily.  Marland Kitchen FLOVENT HFA 220 MCG/ACT inhaler Inhale 2 puffs into the lungs daily.  . furosemide (LASIX) 20 MG tablet Take 20 mg by mouth daily.  . hydrALAZINE (APRESOLINE) 100 MG tablet TAKE 1 TABLET (100 MG TOTAL) BY MOUTH 3 (THREE) TIMES DAILY.  . isosorbide mononitrate (IMDUR) 60 MG 24 hr tablet Take 1 tablet (60 mg total) by mouth daily.  Marland Kitchen levothyroxine (SYNTHROID, LEVOTHROID) 100 MCG tablet Take 100 mcg by mouth daily.  Marland Kitchen lisinopril (PRINIVIL,ZESTRIL) 40 MG tablet Take 40 mg by mouth daily.  . montelukast (SINGULAIR)  10 MG tablet Take 10 mg by mouth daily.  . Rivaroxaban (XARELTO) 15 MG TABS tablet Take 1 tablet (15 mg total) by mouth daily with supper.  . Vitamin D3 (VITAMIN D) 25 MCG tablet Take 1,000 Units by mouth daily.    No Known Allergies    Review of Systems negative except from HPI and PMH  Physical Exam BP (!) 120/52   Pulse (!) 51   Ht 5\' 5"  (1.651 m)   Wt 162 lb (73.5 kg)   SpO2 97%   BMI 26.96 kg/m  Well developed and well nourished in no acute distress HENT normal E scleral and icterus clear Neck Supple JVP flat; carotids brisk and full Clear to ausculation Regular rate and rhythm, 2/6 M Soft with active bowel sounds No clubbing cyanosis  Edema Alert and oriented, grossly normal motor and sensory function Skin Warm  and Dry  ECG sinus rhythm at 51 Intervals 26/15/48  CrCl cannot be calculated (Patient's most recent lab result is older than the maximum 21 days allowed.).   Assessment and  Plan Atrial fibrillation-persistent  Sinus bradycardia  IVCD  ?  Posttermination pause  Congestive heart failure-chronic-diastolic  Hypertension-severe  Flecainide  AS mild  There has been interval change of more than 30% on her conduction system (PR plus QRS duration) with initiation of flecainide. We discussed options including decreasing the flecainide from 50--25 mg twice daily with electrocardiographic reassessment versus discontinuing it and then using amiodarone with backup bradycardia pacing.  She would like to do the former. We will plan repeat twelve-lead in about 10 days. If it does not permit ongoing flecainide, will discontinue and then proceed with annual/pacer  Blood pressure well controlled  No clinical bleeding  Euvolemic continue current meds   Current medicines are reviewed at length with the patient today .  The patient does not  have concerns regarding medicines.

## 2020-01-11 ENCOUNTER — Other Ambulatory Visit: Payer: Self-pay | Admitting: Physician Assistant

## 2020-01-11 DIAGNOSIS — I4819 Other persistent atrial fibrillation: Secondary | ICD-10-CM

## 2020-01-11 NOTE — Telephone Encounter (Signed)
Refill request

## 2020-01-11 NOTE — Telephone Encounter (Signed)
Prescription refill request for Xarelto received.  Indication: 01/10/20 Last office visit: Weight: 162 lbs  Age:86 Scr: 1.55 CrCl: 30 mL/min

## 2020-01-21 ENCOUNTER — Ambulatory Visit
Admission: RE | Admit: 2020-01-21 | Discharge: 2020-01-21 | Disposition: A | Discharge status: Home / Self Care | Payer: Medicare Other | Source: Ambulatory Visit | Attending: Internal Medicine | Admitting: Internal Medicine

## 2020-01-21 ENCOUNTER — Other Ambulatory Visit: Payer: Self-pay

## 2020-01-21 DIAGNOSIS — R001 Bradycardia, unspecified: Secondary | ICD-10-CM | POA: Insufficient documentation

## 2020-01-21 DIAGNOSIS — I4819 Other persistent atrial fibrillation: Secondary | ICD-10-CM | POA: Diagnosis present

## 2020-01-22 ENCOUNTER — Telehealth: Payer: Self-pay | Admitting: Internal Medicine

## 2020-01-22 NOTE — Telephone Encounter (Signed)
The patient had a repeat EKG done on 01/21/20. This was ordered per Dr. Graciela Husbands to re-evaluate her [facility] Fall as this was prolonged at her office visit on 01/10/20. Dr. Graciela Husbands decrease flecainide to 25 mg BID at that time.  Dr. Graciela Husbands reviewed the EKG from 01/21/20.  Per MD, the patient's PRI has improved on lower dose flecainide. Recommendations are to continue with flecainide 25 mg BID.  I called and spoke with the patient and made her aware of the above results/ MD recommendations.  She voices understanding and is agreeable.

## 2020-01-24 ENCOUNTER — Other Ambulatory Visit: Payer: Self-pay

## 2020-01-24 ENCOUNTER — Encounter: Payer: Self-pay | Admitting: Cardiovascular Disease

## 2020-01-24 ENCOUNTER — Ambulatory Visit (INDEPENDENT_AMBULATORY_CARE_PROVIDER_SITE_OTHER): Payer: Medicare Other | Admitting: Cardiovascular Disease

## 2020-01-24 VITALS — BP 160/58 | HR 50 | Ht 65.0 in | Wt 161.4 lb

## 2020-01-24 DIAGNOSIS — R001 Bradycardia, unspecified: Secondary | ICD-10-CM

## 2020-01-24 DIAGNOSIS — I5032 Chronic diastolic (congestive) heart failure: Secondary | ICD-10-CM | POA: Diagnosis not present

## 2020-01-24 DIAGNOSIS — E785 Hyperlipidemia, unspecified: Secondary | ICD-10-CM

## 2020-01-24 DIAGNOSIS — I4819 Other persistent atrial fibrillation: Secondary | ICD-10-CM

## 2020-01-24 DIAGNOSIS — I779 Disorder of arteries and arterioles, unspecified: Secondary | ICD-10-CM

## 2020-01-24 DIAGNOSIS — I350 Nonrheumatic aortic (valve) stenosis: Secondary | ICD-10-CM | POA: Diagnosis not present

## 2020-01-24 NOTE — Patient Instructions (Signed)
Medication Instructions:  - Your physician recommends that you continue on your current medications as directed. Please refer to the Current Medication list given to you today.  - please HOLD your carvedilol the morning of your cataract surgery  *If you need a refill on your cardiac medications before your next appointment, please call your pharmacy*   Lab Work: - none ordered  If you have labs (blood work) drawn today and your tests are completely normal, you will receive your results only by: Marland Kitchen MyChart Message (if you have MyChart) OR . A paper copy in the mail If you have any lab test that is abnormal or we need to change your treatment, we will call you to review the results.   Testing/Procedures: - none ordered   Follow-Up: At [facility], you and your health needs are our priority.  As part of our continuing mission to provide you with exceptional heart care, we have created designated Provider Care Teams.  These Care Teams include your primary Cardiologist (physician) and Advanced Practice Providers (APPs -  Physician Assistants and Nurse Practitioners) who all work together to provide you with the care you need, when you need it.  We recommend signing up for the patient portal called "MyChart".  Sign up information is provided on this After Visit Summary.  MyChart is used to connect with patients for Virtual Visits (Telemedicine).  Patients are able to view lab/test results, encounter notes, upcoming appointments, etc.  Non-urgent messages can be sent to your provider as well.   To learn more about what you can do with MyChart, go to [email or web address].    Your next appointment:   6 month(s)  The format for your next appointment:   In Person  Provider:   You may see Lorine Bears, MD or one of the following Advanced Practice Providers on your designated Care Team:    Nicolasa Ducking, NP  Eula Listen, PA-C  Marisue Ivan, PA-C  Cadence Fransico Michael,  [place]    Other Instructions

## 2020-01-24 NOTE — Progress Notes (Signed)
Cardiology Office Note   Date:  01/24/2020   ID:  LAYCIE SCHRINER, DOB December 15, 1929, MRN [id number]  PCP:  Leanna Sato, MD  Cardiologist:   Lorine Bears, MD   Chief Complaint  Patient presents with  . Follow-up    6 month. Meds reviewed by the pt. verbally. Pt. is concerned with her low heart rate and will have cataract surgery in November 2022.       History of Present Illness: Joan Willis is a 86 y.o. female who presents for a follow-up visit regarding persistent atrial fibrillation and chronic diastolic heart failure. She has chronic medical conditions including mild aortic stenosis, congenital atrophy of right kidney with chronic kidney disease, carotid disease status post carotid endarterectomy, essential hypertension, hyperlipidemia, hypothyroidism and asthma. She was hospitalized in April 2019 with acute on chronic diastolic heart failure in the setting of bronchitis and poorly controlled blood pressure.  Echocardiogram showed normal LV systolic function with moderate LVH and grade 2 diastolic dysfunction, mild pulmonary hypertension and aortic sclerosis. She was hospitalized in September 2020 with chest pain and shortness of breath and was noted to be in atrial fibrillation with rapid ventricular response.  Lexiscan Myoview showed small defect of mild severity in the apical and anterior location likely due to breast attenuation and overall was a low risk study.  She was seen in October with a rash thought to be due to Eliquis.  She was switched to Xarelto. She was hospitalized in May of this year with A. fib with RVR. She had posttermination pauses and was referred to Dr. Graciela Husbands. She was started on flecainide and the dose was subsequently decreased to 25 mg twice daily. She has not had recurrent atrial fibrillation since then and overall has been doing reasonably well. She denies chest pain, palpitations or shortness of breath. She is having cataract surgery in the near  future. Due to uncontrolled hypertension, she underwent renal artery duplex which showed atrophied right kidney which is known from before. 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 The patient denies any abdominal pain or weight loss.   Past Medical History:  Diagnosis Date  . (HFpEF) heart failure with preserved ejection fraction (HCC)    a. TTE 9/20 - EF 55 to 60%, no LVH, diastolic dysfunction, normal RV systolic function, mildly dilated left atrium, mild mitral regurgitation, mild tricuspid regurgitation, mild aortic stenosis, moderately elevated PASP  . Asthma   . Carotid artery disease (HCC)   . Hyperlipemia   . Hypertension   . Hypothyroid   . PAF (paroxysmal atrial fibrillation) (HCC)    a.  Diagnosed 9/20; b. CHADS2VASc 6 (CHF, HTN, age x 2, vascular disease, female); c. on Eliquis    Past Surgical History:  Procedure Laterality Date  . ABDOMINAL HYSTERECTOMY    . APPENDECTOMY    . arm surgery    . cartotid endaractomy       Current Outpatient Medications  Medication Sig Dispense Refill  . acetaminophen (TYLENOL) 325 MG tablet Take 325 mg by mouth every 6 (six) hours as needed for moderate pain.    Marland Kitchen albuterol (PROVENTIL HFA;VENTOLIN HFA) 108 (90 Base) MCG/ACT inhaler Inhale 2 puffs into the lungs every 6 (six) hours as needed for wheezing or shortness of breath. 1 Inhaler 3  . albuterol (PROVENTIL) (2.5 MG/3ML) 0.083% nebulizer solution Inhale 2.5 mLs into the lungs every 4 (four) hours as  needed for wheezing or shortness of breath.   3  . amLODipine (NORVASC) 10 MG tablet Take 10 mg by mouth daily.  4  . atorvastatin (LIPITOR) 40 MG tablet Take 40 mg by mouth daily.  3  . carvedilol (COREG) 6.25 MG tablet Take 1 tablet (6.25 mg total) by mouth 2 (two) times daily. 180 tablet 3  . flecainide (TAMBOCOR) 50 MG tablet Take 0.5 tablets (25 mg total) by mouth 2 (two) times daily.     Marland Kitchen FLOVENT HFA 220 MCG/ACT inhaler Inhale 2 puffs into the lungs daily. 1 Inhaler 10  . furosemide (LASIX) 20 MG tablet Take 20 mg by mouth daily.  4  . hydrALAZINE (APRESOLINE) 100 MG tablet TAKE 1 TABLET (100 MG TOTAL) BY MOUTH 3 (THREE) TIMES DAILY. 270 tablet 0  . isosorbide mononitrate (IMDUR) 60 MG 24 hr tablet Take 1 tablet (60 mg total) by mouth daily. 90 tablet 1  . levothyroxine (SYNTHROID, LEVOTHROID) 100 MCG tablet Take 100 mcg by mouth daily.  4  . lisinopril (PRINIVIL,ZESTRIL) 40 MG tablet Take 40 mg by mouth daily.  1  . montelukast (SINGULAIR) 10 MG tablet Take 10 mg by mouth daily.  4  . Vitamin D3 (VITAMIN D) 25 MCG tablet Take 1,000 Units by mouth daily.    Carlena Hurl 15 MG TABS tablet TAKE 1 TABLET (15 MG TOTAL) BY MOUTH DAILY WITH SUPPER. 90 tablet 3   No current facility-administered medications for this visit.    Allergies:   Patient has no known allergies.    Social History:  The patient  reports that she has never smoked. She has never used smokeless tobacco. She reports that she does not drink alcohol and does not use drugs.   Family History:  The patient's family history includes Hypertension in her mother.    ROS:  Please see the history of present illness.   Otherwise, review of systems are positive for none.   All other systems are reviewed and negative.    PHYSICAL EXAM: VS:  BP (!) 160/58 (BP Location: Left Arm, Patient Position: Sitting, Cuff Size: Normal)   Pulse (!) 50   Ht 5\' 5"  (1.651 m)   Wt 161 lb 6 oz (73.2 kg)   BMI 26.85 kg/m  , BMI Body mass index is 26.85 kg/m. GEN: Well nourished, well developed, in no acute distress  HEENT: normal  Neck: no JVD, carotid bruits, or masses Cardiac: RRR; no  rubs, or gallops,no edema .  2 / 6 systolic murmur in the aortic area Respiratory:  clear to auscultation bilaterally, normal work of breathing GI: soft, nontender, nondistended, + BS MS: no deformity or atrophy  Skin: warm and dry, no rash Neuro:   Strength and sensation are intact Psych: euthymic mood, full affect   EKG:  EKG is ordered today. The ekg ordered today demonstrates sinus bradycardia with left axis deviation and IVCD.   Recent Labs: 08/28/2019: ALT 15 08/29/2019: B Natriuretic Peptide 216.3; Magnesium 2.3; TSH 0.881 08/30/2019: BUN 33; Creatinine, Ser 1.55; Hemoglobin 12.4; Platelets 245; Potassium 4.1; Sodium 141    Lipid Panel    Component Value Date/Time   CHOL 200 08/29/2019 0421   TRIG 98 08/29/2019 0421   HDL 63 08/29/2019 0421   CHOLHDL 3.2 08/29/2019 0421   VLDL 20 08/29/2019 0421   LDLCALC 117 (H) 08/29/2019 0421      Wt Readings from Last 3 Encounters:  01/24/20 161 lb 6 oz (73.2 kg)  01/10/20 162 lb (  73.5 kg)  09/28/19 161 lb (73 kg)       No flowsheet data found.    ASSESSMENT AND PLAN:  1. Persistent atrial fibrillation: She is maintaining in sinus rhythm with small dose flecainide and tolerating anticoagulation with Xarelto 15 mg once daily which is appropriate for renal function.  2.  Chronic diastolic heart failure: she appears to be euvolemic on small dose furosemide 20 mg daily.  3.  Mild aortic stenosis: Stable heart murmur. Consider repeat echocardiogram in September 2022  4.  Carotid disease status post carotid endarterectomy.  Stable  5.  Essential hypertension: Blood pressure continues to be elevated but has been stable. Renal artery duplex was suggestive of borderline significant stenosis affecting the left renal artery. The right kidney is known to be atrophied. I discussed with her the indications for renal artery stenting. Given her age and comorbidities and the fact that she had elevated blood pressure for a long time, the patient and daughter prefer a conservative approach which I think is reasonable.  6.  Hyperlipidemia: Currently on atorvastatin.  7. Stenosis of the SMA: Does not have any symptoms of chronic mesenteric ischemia. No indication for  intervention.    Disposition:   FU with me in 6 months  Signed,  Lorine Bears, MD  01/24/2020 10:16 AM    [facility]

## 2020-02-06 ENCOUNTER — Encounter: Payer: Self-pay | Admitting: Anesthesiology

## 2020-02-06 ENCOUNTER — Other Ambulatory Visit: Payer: Self-pay

## 2020-02-06 ENCOUNTER — Encounter: Payer: Self-pay | Admitting: Ophthalmology

## 2020-02-08 ENCOUNTER — Other Ambulatory Visit: Admission: RE | Admit: 2020-02-08 | Payer: Medicare Other | Source: Ambulatory Visit

## 2020-02-12 ENCOUNTER — Ambulatory Visit: Admission: RE | Admit: 2020-02-12 | Payer: Medicare Other | Source: Home / Self Care | Admitting: Ophthalmology

## 2020-02-12 HISTORY — DX: Presence of external hearing-aid: Z97.4

## 2020-02-12 HISTORY — DX: Disorder of kidney and ureter, unspecified: N28.9

## 2020-02-12 SURGERY — PHACOEMULSIFICATION, CATARACT, WITH IOL INSERTION
Anesthesia: Topical | Laterality: Left

## 2020-03-18 ENCOUNTER — Other Ambulatory Visit: Payer: Self-pay

## 2020-03-18 MED ORDER — ISOSORBIDE MONONITRATE ER 60 MG PO TB24: 60.0000 mg | ORAL_TABLET | Freq: Every day | ORAL | 0 refills | Status: DC

## 2020-03-18 NOTE — Telephone Encounter (Signed)
*  STAT* If patient is at the pharmacy, call can be transferred to refill team.   1. Which medications need to be refilled? (please list name of each medication and dose if known) Isosorbide  2. Which pharmacy/location (including street and city if local pharmacy) is medication to be sent to? [facility]  3. Do they need a 30 day or 90 day supply? 90

## 2020-03-20 ENCOUNTER — Other Ambulatory Visit: Payer: Self-pay | Admitting: Internal Medicine

## 2020-04-13 ENCOUNTER — Other Ambulatory Visit: Payer: Self-pay | Admitting: Family

## 2020-05-01 ENCOUNTER — Ambulatory Visit: Payer: Medicare Other | Admitting: Internal Medicine

## 2020-05-15 ENCOUNTER — Ambulatory Visit: Payer: Medicare Other | Admitting: Internal Medicine

## 2020-05-15 ENCOUNTER — Other Ambulatory Visit: Payer: Self-pay

## 2020-05-15 ENCOUNTER — Ambulatory Visit (INDEPENDENT_AMBULATORY_CARE_PROVIDER_SITE_OTHER): Payer: Medicare Other | Admitting: Internal Medicine

## 2020-05-15 ENCOUNTER — Encounter: Payer: Self-pay | Admitting: Internal Medicine

## 2020-05-15 VITALS — BP 190/70 | HR 55 | Ht 65.0 in | Wt 160.0 lb

## 2020-05-15 DIAGNOSIS — I4819 Other persistent atrial fibrillation: Secondary | ICD-10-CM

## 2020-05-15 DIAGNOSIS — R001 Bradycardia, unspecified: Secondary | ICD-10-CM | POA: Diagnosis not present

## 2020-05-15 DIAGNOSIS — I5032 Chronic diastolic (congestive) heart failure: Secondary | ICD-10-CM | POA: Diagnosis not present

## 2020-05-15 NOTE — Progress Notes (Signed)
.      Patient Care Team: Leanna Sato, MD as PCP - General (Family Medicine) [place] Ouch, MD as PCP - Cardiology (Cardiology)   HPI  Joan Willis is a 87 y.o. female Seen in follow-up for intermittent atrial fibrillation which have triggered decompensated diastolic heart failure.  It was elected to try her on flecainide for rhythm suppression There is also a report the documentation of which I could not find of a posttermination pause Also has sinus bradycardia which prompted [place] to avoid amiodarone because of concerns of pacing.  No complaints  Denies chest pain and shortness of breath; no edema  BP at home averages 140's  Highest of late about 156  DATE TEST EF   9/20 MYOVIEW   55-65 % Small perfusion defect ? Breast artifact  9/20 Echo   55-60 % AS Mild (mean Grad 14)         Date Cr K Hgb  5/21 1.55 4.1 12.4         DATE PR interval QRSduration Dose  6/21*  191 112 0  9/21 262 152 50  10/21 220 132 25          Thromboembolic risk factors ( age  -2, HTN-1, DM-1, Vasc disease -1, Gender-1) for a CHADSVASc Score of >=6     Records and Results Reviewed   Past Medical History:  Diagnosis Date  . (HFpEF) heart failure with preserved ejection fraction (HCC)    a. TTE 9/20 - EF 55 to 60%, no LVH, diastolic dysfunction, normal RV systolic function, mildly dilated left atrium, mild mitral regurgitation, mild tricuspid regurgitation, mild aortic stenosis, moderately elevated PASP  . Asthma   . Carotid artery disease (HCC)   . Hyperlipemia   . Hypertension   . Hypothyroid   . 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 PAF (paroxysmal atrial fibrillation) (HCC)    a.  Diagnosed 9/20; b. CHADS2VASc 6 (CHF, HTN, age x 2, vascular disease, female); c. on Eliquis  . Wears hearing aid in right ear    has.  does not wear    Past Surgical History:  Procedure Laterality Date  . ABDOMINAL HYSTERECTOMY    . APPENDECTOMY    . arm  surgery    . cartotid endaractomy      Current Meds  Medication Sig  . acetaminophen (TYLENOL) 325 MG tablet Take 325 mg by mouth every 6 (six) hours as needed for moderate pain.  Marland Kitchen albuterol (PROVENTIL HFA;VENTOLIN HFA) 108 (90 Base) MCG/ACT inhaler Inhale 2 puffs into the lungs every 6 (six) hours as needed for wheezing or shortness of breath.  Marland Kitchen albuterol (PROVENTIL) (2.5 MG/3ML) 0.083% nebulizer solution Inhale 2.5 mLs into the lungs every 4 (four) hours as needed for wheezing or shortness of breath.   Marland Kitchen amLODipine (NORVASC) 10 MG tablet Take 10 mg by mouth daily.  Marland Kitchen atorvastatin (LIPITOR) 40 MG tablet Take 40 mg by mouth daily.  . baclofen (LIORESAL) 10 MG tablet Take 10 mg by mouth 3 (three) times daily.  . carvedilol (COREG) 6.25 MG tablet TAKE 1 TABLET BY MOUTH TWICE A DAY  . flecainide (TAMBOCOR) 50 MG tablet TAKE 1 TABLET BY MOUTH TWICE A DAY  . FLOVENT HFA 220 MCG/ACT inhaler Inhale 2 puffs into the lungs daily.  . furosemide (LASIX) 20 MG tablet Take 20 mg by mouth daily.  . hydrALAZINE (APRESOLINE) 100 MG tablet TAKE 1 TABLET (100 MG TOTAL) BY MOUTH  3 (THREE) TIMES DAILY.  . isosorbide mononitrate (IMDUR) 60 MG 24 hr tablet Take 1 tablet (60 mg total) by mouth daily.  Marland Kitchen levothyroxine (SYNTHROID, LEVOTHROID) 100 MCG tablet Take 100 mcg by mouth daily.  Marland Kitchen lisinopril (PRINIVIL,ZESTRIL) 40 MG tablet Take 40 mg by mouth daily.  . montelukast (SINGULAIR) 10 MG tablet Take 10 mg by mouth daily.  Marland Kitchen tiZANidine (ZANAFLEX) 2 MG tablet Take 2 mg by mouth 3 (three) times daily.  . Vitamin D3 (VITAMIN D) 25 MCG tablet Take 2,000 Units by mouth daily.   Carlena Hurl 15 MG TABS tablet TAKE 1 TABLET (15 MG TOTAL) BY MOUTH DAILY WITH SUPPER.    No Known Allergies    Review of Systems negative except from HPI and PMH  Physical Exam BP (!) 190/70 (BP Location: Left Arm, Patient Position: Sitting, Cuff Size: Normal)   Pulse (!) 55   Ht 5\' 5"  (1.651 m)   Wt 160 lb (72.6 kg)   SpO2 95%   BMI  26.63 kg/m  Well developed and nourished in no acute distress HENT normal Neck supple with JVP flat Carotids delayed Clear Regular rate and rhythm, 2/6 M Abd-soft with active BS No Clubbing cyanosis edema Skin-warm and dry A & Oriented  Grossly normal sensory and motor function  ECG sinus @ 55 21/12/48 LVH     CrCl cannot be calculated (Patient's most recent lab result is older than the maximum 21 days allowed.).   Assessment and  Plan Atrial fibrillation-persistent  Sinus bradycardia  IVCD  ?  Posttermination pause  Congestive heart failure-chronic-diastolic  Hypertension-severe  Flecainide  AS mild- mod  No interval atrial fibrillation of whe she is aware.  The Flecainide dose, surprisingly has been effective (or serendipitously  assoicated with ) no afib and the QRSd/PR intervals now nearly not so porlonged   AS may be more severe, with delayed carotids, but echo reassessment planned per Dr MA note in 9/22  \ BP elevated here, but at home not too bad  She currently is on 5-6 meds (incl diuretic) at maximal doses so will hold where we are,  If worsens would change furosemide to spironolactone   Euvolemic continue current meds

## 2020-05-15 NOTE — Patient Instructions (Signed)
Medication Instructions:  Your physician recommends that you continue on your current medications as directed. Please refer to the Current Medication list given to you today.  *If you need a refill on your cardiac medications before your next appointment, please call your pharmacy*   Lab Work: None ordered   Testing/Procedures: None ordered   Follow-Up: At [facility], you and your health needs are our priority.  As part of our continuing mission to provide you with exceptional heart care, we have created designated Provider Care Teams.  These Care Teams include your primary Cardiologist (physician) and Advanced Practice Providers (APPs -  Physician Assistants and Nurse Practitioners) who all work together to provide you with the care you need, when you need it.  We recommend signing up for the patient portal called "MyChart".  Sign up information is provided on this After Visit Summary.  MyChart is used to connect with patients for Virtual Visits (Telemedicine).  Patients are able to view lab/test results, encounter notes, upcoming appointments, etc.  Non-urgent messages can be sent to your provider as well.   To learn more about what you can do with MyChart, go to [email or web address].    Your next appointment:   6 month(s)  The format for your next appointment:   In Person  Provider:   Sherryl Manges, MD

## 2020-06-09 ENCOUNTER — Other Ambulatory Visit: Payer: Self-pay

## 2020-06-09 MED ORDER — ISOSORBIDE MONONITRATE ER 60 MG PO TB24: 60.0000 mg | ORAL_TABLET | Freq: Every day | ORAL | 0 refills | Status: DC

## 2020-08-14 ENCOUNTER — Encounter: Payer: Self-pay | Admitting: Cardiovascular Disease

## 2020-08-14 ENCOUNTER — Ambulatory Visit (INDEPENDENT_AMBULATORY_CARE_PROVIDER_SITE_OTHER): Payer: Medicare Other | Admitting: Cardiovascular Disease

## 2020-08-14 ENCOUNTER — Other Ambulatory Visit: Payer: Self-pay

## 2020-08-14 VITALS — BP 190/70 | HR 54 | Ht 65.0 in | Wt 159.0 lb

## 2020-08-14 DIAGNOSIS — I350 Nonrheumatic aortic (valve) stenosis: Secondary | ICD-10-CM | POA: Diagnosis not present

## 2020-08-14 DIAGNOSIS — I4819 Other persistent atrial fibrillation: Secondary | ICD-10-CM | POA: Diagnosis not present

## 2020-08-14 DIAGNOSIS — I10 Essential (primary) hypertension: Secondary | ICD-10-CM

## 2020-08-14 DIAGNOSIS — E785 Hyperlipidemia, unspecified: Secondary | ICD-10-CM

## 2020-08-14 DIAGNOSIS — I6523 Occlusion and stenosis of bilateral carotid arteries: Secondary | ICD-10-CM

## 2020-08-14 DIAGNOSIS — Z7901 Long term (current) use of anticoagulants: Secondary | ICD-10-CM

## 2020-08-14 DIAGNOSIS — I5032 Chronic diastolic (congestive) heart failure: Secondary | ICD-10-CM

## 2020-08-14 NOTE — Patient Instructions (Addendum)
Medication Instructions:  Your physician recommends that you continue on your current medications as directed. Please refer to the Current Medication list given to you today.  *If you need a refill on your cardiac medications before your next appointment, please call your pharmacy*   Lab Work: Cbc and Bmp same day as echo  If you have labs (blood work) drawn today and your tests are completely normal, you will receive your results only by: Marland Kitchen MyChart Message (if you have MyChart) OR . A paper copy in the mail If you have any lab test that is abnormal or we need to change your treatment, we will call you to review the results.   Testing/Procedures: Your physician has requested that you have an echocardiogram. Echocardiography is a painless test that uses sound waves to create images of your heart. It provides your doctor with information about the size and shape of your heart and how well your heart's chambers and valves are working. This procedure takes approximately one hour. There are no restrictions for this procedure. (To be scheduled in Sept 2022)   Follow-Up: At [facility], you and your health needs are our priority.  As part of our continuing mission to provide you with exceptional heart care, we have created designated Provider Care Teams.  These Care Teams include your primary Cardiologist (physician) and Advanced Practice Providers (APPs -  Physician Assistants and Nurse Practitioners) who all work together to provide you with the care you need, when you need it.  We recommend signing up for the patient portal called "MyChart".  Sign up information is provided on this After Visit Summary.  MyChart is used to connect with patients for Virtual Visits (Telemedicine).  Patients are able to view lab/test results, encounter notes, upcoming appointments, etc.  Non-urgent messages can be sent to your provider as well.   To learn more about what you can do with MyChart, go to  [email or web address].    Your next appointment:   Your physician wants you to follow-up in: 6 months  You will receive a reminder letter in the mail two months in advance. If you don't receive a letter, please call our office to schedule the follow-up appointment.   The format for your next appointment:   In Person  Provider:   You may see Lorine Bears, MD or one of the following Advanced Practice Providers on your designated Care Team:    Nicolasa Ducking, NP  Eula Listen, PA-C  Marisue Ivan, PA-C  Cadence Whitten, [place]  Gillian Shields, NP    Other Instructions N/A

## 2020-08-14 NOTE — Progress Notes (Signed)
Cardiology Office Note   Date:  08/14/2020   ID:  Joan Willis, DOB 03/08/1930, MRN [id number]  PCP:  Leanna Sato, MD  Cardiologist:   Lorine Bears, MD   Chief Complaint  Patient presents with  . Follow-up    6 month f/u c/o elevated BP. Meds reviewed verbally with pt.      History of Present Illness: Joan Willis is a 87 y.o. female who presents for a follow-up visit regarding persistent atrial fibrillation and chronic diastolic heart failure. She has chronic medical conditions including mild aortic stenosis, congenital atrophy of right kidney with chronic kidney disease, carotid disease status post carotid endarterectomy, essential hypertension, hyperlipidemia, hypothyroidism and asthma. She was hospitalized in April 2019 with acute on chronic diastolic heart failure in the setting of bronchitis and poorly controlled blood pressure.  Echocardiogram showed normal LV systolic function with moderate LVH and grade 2 diastolic dysfunction, mild pulmonary hypertension and aortic sclerosis. She was hospitalized in September 2020 with chest pain and shortness of breath and was noted to be in atrial fibrillation with rapid ventricular response.  Lexiscan Myoview showed small defect of mild severity in the apical and anterior location likely due to breast attenuation and overall was a low risk study.  She had a rash with Eliquis and was switched to Xarelto.   She was hospitalized in May of 2021 with A. fib with RVR. She had posttermination pauses and was referred to Dr. Graciela Husbands.  She was treated with flecainide with no recurrent atrial fibrillation.    She is known to have resistant hypertension with atrophied right kidney.  She had renal artery duplex done in July 2021 which showed moderately elevated velocities affecting the left renal artery at 323 cm/s.  Right renal size was only 6.59 cm. We discussed the possibility of proceeding with renal artery angiography and possible  revascularization but the patient and daughter preferred a conservative approach considering her age. Her blood pressure at home is much better than in the office and usually runs around 145 to 150 mmHg systolic.  She has been doing well with no chest pain, shortness of breath or palpitations. She lives with her daughter but continues to be independent with activities of daily living.   Past Medical History:  Diagnosis Date  . (HFpEF) heart failure with preserved ejection fraction (HCC)    a. TTE 9/20 - EF 55 to 60%, no LVH, diastolic dysfunction, normal RV systolic function, mildly dilated left atrium, mild mitral regurgitation, mild tricuspid regurgitation, mild aortic stenosis, moderately elevated PASP  . Asthma   . Carotid artery disease (HCC)   . Hyperlipemia   . Hypertension   . Hypothyroid   . 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 PAF (paroxysmal atrial fibrillation) (HCC)    a.  Diagnosed 9/20; b. CHADS2VASc 6 (CHF, HTN, age x 2, vascular disease, female); c. on Eliquis  . Wears hearing aid in right ear    has.  does not wear    Past Surgical History:  Procedure Laterality Date  . ABDOMINAL HYSTERECTOMY    . APPENDECTOMY    . arm surgery    . cartotid endaractomy       Current Outpatient Medications  Medication Sig Dispense Refill  . acetaminophen (TYLENOL) 325 MG tablet Take 325 mg by mouth every 6 (six) hours as needed for moderate pain.    Marland Kitchen albuterol (PROVENTIL HFA;VENTOLIN HFA) 108 (90 Base) MCG/ACT inhaler Inhale 2 puffs  into the lungs every 6 (six) hours as needed for wheezing or shortness of breath. 1 Inhaler 3  . albuterol (PROVENTIL) (2.5 MG/3ML) 0.083% nebulizer solution Inhale 2.5 mLs into the lungs every 4 (four) hours as needed for wheezing or shortness of breath.   3  . amLODipine (NORVASC) 10 MG tablet Take 10 mg by mouth daily.  4  . atorvastatin (LIPITOR) 40 MG tablet Take 40 mg by mouth daily.  3  . baclofen (LIORESAL) 10 MG tablet  Take 10 mg by mouth 3 (three) times daily.    . carvedilol (COREG) 6.25 MG tablet TAKE 1 TABLET BY MOUTH TWICE A DAY 180 tablet 3  . flecainide (TAMBOCOR) 50 MG tablet TAKE 1 TABLET BY MOUTH TWICE A DAY 180 tablet 2  . FLOVENT HFA 220 MCG/ACT inhaler Inhale 2 puffs into the lungs daily. 1 Inhaler 10  . furosemide (LASIX) 20 MG tablet Take 20 mg by mouth daily.  4  . hydrALAZINE (APRESOLINE) 100 MG tablet TAKE 1 TABLET (100 MG TOTAL) BY MOUTH 3 (THREE) TIMES DAILY. 270 tablet 0  . isosorbide mononitrate (IMDUR) 60 MG 24 hr tablet Take 1 tablet (60 mg total) by mouth daily. 90 tablet 0  . levothyroxine (SYNTHROID, LEVOTHROID) 100 MCG tablet Take 100 mcg by mouth daily.  4  . lisinopril (PRINIVIL,ZESTRIL) 40 MG tablet Take 40 mg by mouth daily.  1  . montelukast (SINGULAIR) 10 MG tablet Take 10 mg by mouth daily.  4  . tiZANidine (ZANAFLEX) 2 MG tablet Take 2 mg by mouth 3 (three) times daily.    . Vitamin D3 (VITAMIN D) 25 MCG tablet Take 2,000 Units by mouth daily.     Carlena Hurl 15 MG TABS tablet TAKE 1 TABLET (15 MG TOTAL) BY MOUTH DAILY WITH SUPPER. 90 tablet 3   No current facility-administered medications for this visit.    Allergies:   Patient has no known allergies.    Social History:  The patient  reports that she has never smoked. She has never used smokeless tobacco. She reports that she does not drink alcohol and does not use drugs.   Family History:  The patient's family history includes Hypertension in her mother.    ROS:  Please see the history of present illness.   Otherwise, review of systems are positive for none.   All other systems are reviewed and negative.    PHYSICAL EXAM: VS:  BP (!) 190/70 (BP Location: Left Arm, Patient Position: Sitting, Cuff Size: Normal)   Pulse (!) 54   Ht 5\' 5"  (1.651 m)   Wt 159 lb (72.1 kg)   SpO2 97%   BMI 26.46 kg/m  , BMI Body mass index is 26.46 kg/m. GEN: Well nourished, well developed, in no acute distress  HEENT: normal   Neck: no JVD, carotid bruits, or masses Cardiac: RRR; no  rubs, or gallops,no edema .  2 / 6 systolic murmur in the aortic area Respiratory:  clear to auscultation bilaterally, normal work of breathing GI: soft, nontender, nondistended, + BS MS: no deformity or atrophy  Skin: warm and dry, no rash Neuro:  Strength and sensation are intact Psych: euthymic mood, full affect   EKG:  EKG is ordered today. The ekg ordered today demonstrates sinus bradycardia with left axis deviation, LVH with repolarization abnormalities.   Recent Labs: 08/28/2019: ALT 15 08/29/2019: B Natriuretic Peptide 216.3; Magnesium 2.3; TSH 0.881 08/30/2019: BUN 33; Creatinine, Ser 1.55; Hemoglobin 12.4; Platelets 245; Potassium 4.1; Sodium  141    Lipid Panel    Component Value Date/Time   CHOL 200 08/29/2019 0421   TRIG 98 08/29/2019 0421   HDL 63 08/29/2019 0421   CHOLHDL 3.2 08/29/2019 0421   VLDL 20 08/29/2019 0421   LDLCALC 117 (H) 08/29/2019 0421      Wt Readings from Last 3 Encounters:  08/14/20 159 lb (72.1 kg)  05/15/20 160 lb (72.6 kg)  01/24/20 161 lb 6 oz (73.2 kg)       No flowsheet data found.    ASSESSMENT AND PLAN:  1. Persistent atrial fibrillation: She is maintaining in sinus rhythm with  flecainide and tolerating anticoagulation with Xarelto 15 mg once daily which is appropriate for renal function.  She will need a follow-up CBC and basic metabolic profile.  2.  Chronic diastolic heart failure: she appears to be euvolemic on small dose furosemide 20 mg daily.  3.  Mild aortic stenosis: Stable heart murmur.  I requested repeat echocardiogram to be done in September.  4.  Carotid disease status post carotid endarterectomy.  Stable  5.  Essential hypertension: She has a component of whitecoat syndrome and blood pressure is reasonably controlled at home.  She is on maximal antihypertensive therapy.  Carvedilol cannot be increased due to baseline bradycardia.  Continue same  medications for now.  6.  Hyperlipidemia: Currently on atorvastatin.  7. Stenosis of the SMA: Does not have any symptoms of chronic mesenteric ischemia. No indication for intervention.    Disposition:   FU with me in 6 months  Signed,  Lorine Bears, MD  08/14/2020 11:05 AM    [facility]

## 2020-09-04 ENCOUNTER — Other Ambulatory Visit: Payer: Self-pay

## 2020-09-04 MED ORDER — ISOSORBIDE MONONITRATE ER 60 MG PO TB24: 60.0000 mg | ORAL_TABLET | Freq: Every day | ORAL | 0 refills | Status: DC

## 2020-11-20 ENCOUNTER — Ambulatory Visit: Payer: Medicare Other | Admitting: Internal Medicine

## 2020-12-11 ENCOUNTER — Other Ambulatory Visit: Payer: Self-pay

## 2020-12-11 MED ORDER — ISOSORBIDE MONONITRATE ER 60 MG PO TB24: 60.0000 mg | ORAL_TABLET | Freq: Every day | ORAL | 0 refills | Status: DC

## 2020-12-16 ENCOUNTER — Other Ambulatory Visit: Payer: Self-pay | Admitting: Family Medicine

## 2020-12-16 ENCOUNTER — Other Ambulatory Visit: Payer: Medicare Other

## 2020-12-16 DIAGNOSIS — Z13820 Encounter for screening for osteoporosis: Secondary | ICD-10-CM

## 2020-12-21 ENCOUNTER — Other Ambulatory Visit: Payer: Self-pay | Admitting: Cardiovascular Disease

## 2020-12-22 ENCOUNTER — Other Ambulatory Visit: Payer: Self-pay

## 2020-12-22 ENCOUNTER — Ambulatory Visit (INDEPENDENT_AMBULATORY_CARE_PROVIDER_SITE_OTHER): Payer: Medicare Other

## 2020-12-22 DIAGNOSIS — I350 Nonrheumatic aortic (valve) stenosis: Secondary | ICD-10-CM

## 2020-12-22 LAB — ECHOCARDIOGRAM COMPLETE
AR max vel: 1.9 cm2
AV Area VTI: 1.85 cm2
AV Area mean vel: 1.76 cm2
AV Mean grad: 22.5 mmHg
AV Peak grad: 41.3 mmHg
Ao pk vel: 3.22 m/s
Area-P 1/2: 2.55 cm2
MV VTI: 3.42 cm2
S' Lateral: 3 cm

## 2020-12-23 ENCOUNTER — Other Ambulatory Visit: Payer: Self-pay

## 2020-12-23 DIAGNOSIS — I350 Nonrheumatic aortic (valve) stenosis: Secondary | ICD-10-CM

## 2021-02-12 ENCOUNTER — Ambulatory Visit (INDEPENDENT_AMBULATORY_CARE_PROVIDER_SITE_OTHER): Payer: Medicare Other | Admitting: Internal Medicine

## 2021-02-12 ENCOUNTER — Encounter: Payer: Self-pay | Admitting: Internal Medicine

## 2021-02-12 ENCOUNTER — Other Ambulatory Visit: Payer: Self-pay

## 2021-02-12 VITALS — BP 182/60 | HR 45 | Ht 65.5 in | Wt 154.0 lb

## 2021-02-12 DIAGNOSIS — I4819 Other persistent atrial fibrillation: Secondary | ICD-10-CM

## 2021-02-12 DIAGNOSIS — I5032 Chronic diastolic (congestive) heart failure: Secondary | ICD-10-CM | POA: Diagnosis not present

## 2021-02-12 DIAGNOSIS — I10 Essential (primary) hypertension: Secondary | ICD-10-CM | POA: Diagnosis not present

## 2021-02-12 MED ORDER — FLECAINIDE ACETATE 50 MG PO TABS: 25.0000 mg | ORAL_TABLET | Freq: Two times a day (BID) | ORAL | 0 refills | Status: DC

## 2021-02-12 NOTE — Patient Instructions (Signed)
Medication Instructions:  - Your physician has recommended you make the following change in your medication:   1) DECREASE flecainide 50 mg: - take 0.5 tablet (25 mg) by mouth TWICE daily  *If you need a refill on your cardiac medications before your next appointment, please call your pharmacy*   Lab Work: - Your physician recommends that you have lab work today: BMP/ CBC  If you have labs (blood work) drawn today and your tests are completely normal, you will receive your results only by: MyChart Message (if you have MyChart) OR A paper copy in the mail If you have any lab test that is abnormal or we need to change your treatment, we will call you to review the results.   Testing/Procedures: - none ordered   Follow-Up: At [facility], you and your health needs are our priority.  As part of our continuing mission to provide you with exceptional heart care, we have created designated Provider Care Teams.  These Care Teams include your primary Cardiologist (physician) and Advanced Practice Providers (APPs -  Physician Assistants and Nurse Practitioners) who all work together to provide you with the care you need, when you need it.  We recommend signing up for the patient portal called "MyChart".  Sign up information is provided on this After Visit Summary.  MyChart is used to connect with patients for Virtual Visits (Telemedicine).  Patients are able to view lab/test results, encounter notes, upcoming appointments, etc.  Non-urgent messages can be sent to your provider as well.   To learn more about what you can do with MyChart, go to [email or web address].    Your next appointment:   6 month(s)  The format for your next appointment:   In Person  Provider:   Sherryl Manges, MD   Other Instructions N/a

## 2021-02-12 NOTE — Progress Notes (Signed)
.      Patient Care Team: Marguerita Merles, MD as PCP - General (Family Medicine) Wellington Hampshire, MD as PCP - Cardiology (Cardiology)   HPI  Joan Willis is a 87 y.o. female Seen in follow-up for intermittent atrial fibrillation which have triggered decompensated diastolic heart failure.  It was elected to try her on flecainide for rhythm suppression There is also a report the documentation of which I could not find of a posttermination pause Also has sinus bradycardia which prompted [place] to avoid amiodarone because of concerns of pacing.  The patient denies chest pain, shortness of breath, nocturnal dyspnea, orthopnea or peripheral edema.  There have been no palpitations, lightheadedness or syncope.    BP at home averages 140's  Highest of late about 156  DATE TEST EF    9/20 MYOVIEW   55-65 % Small perfusion defect ? Breast artifact  9/20 Echo   55-60 % AS Mild (mean Grad 14)     9/22 Echo   60-65% AS Mod ( mean grad 22)      Date Cr K Hgb  5/21 1.55 4.1 12.4             DATE PR interval QRSduration Dose  6/21*  191 112 0  9/21 262 152 50  10/21 220 132 25  11/22 260 148 50     Thromboembolic risk factors ( age  -2, HTN-1, DM-1, Vasc disease -1, Gender-1) for a CHADSVASc Score of >=6       Records and Results Reviewed   Past Medical History:  Diagnosis Date   (HFpEF) heart failure with preserved ejection fraction (HCC)    a. TTE 9/20 - EF 55 to 123456, no LVH, diastolic dysfunction, normal RV systolic function, mildly dilated left atrium, mild mitral regurgitation, mild tricuspid regurgitation, mild aortic stenosis, moderately elevated PASP   Asthma    Carotid artery disease (HCC)    Hyperlipemia    Hypertension    Hypothyroid    Nonfunctioning kidney    Left kidney "never developed"   PAF (paroxysmal atrial fibrillation) ([place])    a.  Diagnosed 9/20; b. CHADS2VASc 6 (CHF, HTN, age x 2, vascular disease, female); c. on Eliquis   Wears hearing aid in right ear     has.  does not wear    Past Surgical History:  Procedure Laterality Date   ABDOMINAL HYSTERECTOMY     APPENDECTOMY     arm surgery     cartotid endaractomy      Current Meds  Medication Sig   acetaminophen (TYLENOL) 325 MG tablet Take 325 mg by mouth every 6 (six) hours as needed for moderate pain.   albuterol (PROVENTIL HFA;VENTOLIN HFA) 108 (90 Base) MCG/ACT inhaler Inhale 2 puffs into the lungs every 6 (six) hours as needed for wheezing or shortness of breath.   albuterol (PROVENTIL) (2.5 MG/3ML) 0.083% nebulizer solution Inhale 2.5 mLs into the lungs every 4 (four) hours as needed for wheezing or shortness of breath.    amLODipine (NORVASC) 10 MG tablet Take 10 mg by mouth daily.   atorvastatin (LIPITOR) 40 MG tablet Take 40 mg by mouth daily.   baclofen (LIORESAL) 10 MG tablet Take 10 mg by mouth 3 (three) times daily.   carvedilol (COREG) 6.25 MG tablet TAKE 1 TABLET BY MOUTH TWICE A DAY   flecainide (TAMBOCOR) 50 MG tablet TAKE 1 TABLET BY MOUTH TWICE A DAY   FLOVENT HFA 220 MCG/ACT inhaler Inhale 2 puffs  into the lungs daily.   furosemide (LASIX) 20 MG tablet Take 20 mg by mouth daily.   hydrALAZINE (APRESOLINE) 100 MG tablet TAKE 1 TABLET (100 MG TOTAL) BY MOUTH 3 (THREE) TIMES DAILY.   isosorbide mononitrate (IMDUR) 60 MG 24 hr tablet Take 1 tablet (60 mg total) by mouth daily.   levothyroxine (SYNTHROID, LEVOTHROID) 100 MCG tablet Take 100 mcg by mouth daily.   lisinopril (PRINIVIL,ZESTRIL) 40 MG tablet Take 40 mg by mouth daily.   montelukast (SINGULAIR) 10 MG tablet Take 10 mg by mouth daily.   tiZANidine (ZANAFLEX) 2 MG tablet Take 2 mg by mouth 3 (three) times daily.   Vitamin D3 (VITAMIN D) 25 MCG tablet Take 2,000 Units by mouth daily.    XARELTO 15 MG TABS tablet TAKE 1 TABLET (15 MG TOTAL) BY MOUTH DAILY WITH SUPPER.   [DISCONTINUED] isosorbide mononitrate (IMDUR) 30 MG 24 hr tablet Take 30 mg by mouth daily.    No Known Allergies    Review of Systems  negative except from HPI and PMH  Physical Exam BP (!) 182/60 (BP Location: Left Arm, Patient Position: Sitting, Cuff Size: Normal)   Pulse (!) 45   Ht 5' 5.5" (1.664 m)   Wt 154 lb (69.9 kg)   SpO2 96%   BMI 25.24 kg/m  Well developed and nourished in no acute distress HENT normal Neck supple  Clear Regular rate and rhythm,2/6 murmur Abd-soft with active BS No Clubbing cyanosis edema Skin-warm and dry A & Oriented  Grossly normal sensory and motor function  ECG sinus @ 45 26/15/55     CrCl cannot be calculated (Patient's most recent lab result is older than the maximum 21 days allowed.).   Assessment and  Plan Atrial fibrillation-persistent   Sinus bradycardia  IVCD prolonged excessively on flecainide 50    ?  Posttermination pause   Congestive heart failure-chronic-diastolic   Hypertension-severe  AS mod  Renal insufficiency grade 3   No interval afib of which she is aware  continue flec but because of excessive QRS/PR interval prolongation we will decrease the dose from 50--25 mg twice daily.  She is to see Dr. Marcheta Grammes in a few weeks and we will recheck her electrocardiogram at that time.  No bleeding; we will continue Xarelto 15 mg.  We will check CBC  Blood pressure is very high here.  At home however is about 140.  In the event that she needs augmented therapy, she may tolerate spironolactone.  Her bradycardia seems not to be limiting; hence we will continue carvedilol 6.25, isosorbide 60, hydralazine 100 3 times daily and lisinopril 40.  Need to check metabolic profile as last creatinine had increased significantly (5/21)

## 2021-02-13 LAB — BASIC METABOLIC PANEL
BUN/Creatinine Ratio: 13 (ref 12–28)
BUN: 18 mg/dL (ref 8–27)
CO2: 28 mmol/L (ref 20–29)
Calcium: 10 mg/dL (ref 8.7–10.3)
Chloride: 105 mmol/L (ref 96–106)
Creatinine, Ser: 1.42 mg/dL — ABNORMAL HIGH (ref 0.57–1.00)
Glucose: 113 mg/dL — ABNORMAL HIGH (ref 70–99)
Potassium: 3.9 mmol/L (ref 3.5–5.2)
Sodium: 144 mmol/L (ref 134–144)
eGFR: 35 mL/min/{1.73_m2} — ABNORMAL LOW (ref >59)

## 2021-02-13 LAB — CBC WITH DIFFERENTIAL/PLATELET
Basophils Absolute: 0.1 10*3/uL (ref 0.0–0.2)
Basos: 1 %
EOS (ABSOLUTE): 0.1 10*3/uL (ref 0.0–0.4)
Eos: 2 %
Hematocrit: 41.1 % (ref 34.0–46.6)
Hemoglobin: 13.8 g/dL (ref 11.1–15.9)
Immature Grans (Abs): 0 10*3/uL (ref 0.0–0.1)
Immature Granulocytes: 0 %
Lymphocytes Absolute: 1.5 10*3/uL (ref 0.7–3.1)
Lymphs: 21 %
MCH: 28.8 pg (ref 26.6–33.0)
MCHC: 33.6 g/dL (ref 31.5–35.7)
MCV: 86 fL (ref 79–97)
Monocytes Absolute: 0.5 10*3/uL (ref 0.1–0.9)
Monocytes: 7 %
Neutrophils Absolute: 5 10*3/uL (ref 1.4–7.0)
Neutrophils: 69 %
Platelets: 268 10*3/uL (ref 150–450)
RBC: 4.79 x10E6/uL (ref 3.77–5.28)
RDW: 13.3 % (ref 11.7–15.4)
WBC: 7.2 10*3/uL (ref 3.4–10.8)

## 2021-02-27 IMAGING — DX DG CHEST 1V
1 series · 1 of 1 positions shown · non-contrast
Comparison: 12/31/2018, 09/15/2017

CLINICAL DATA: Heart racing chest tightness

EXAM:
CHEST  1 VIEW

[chest ap]
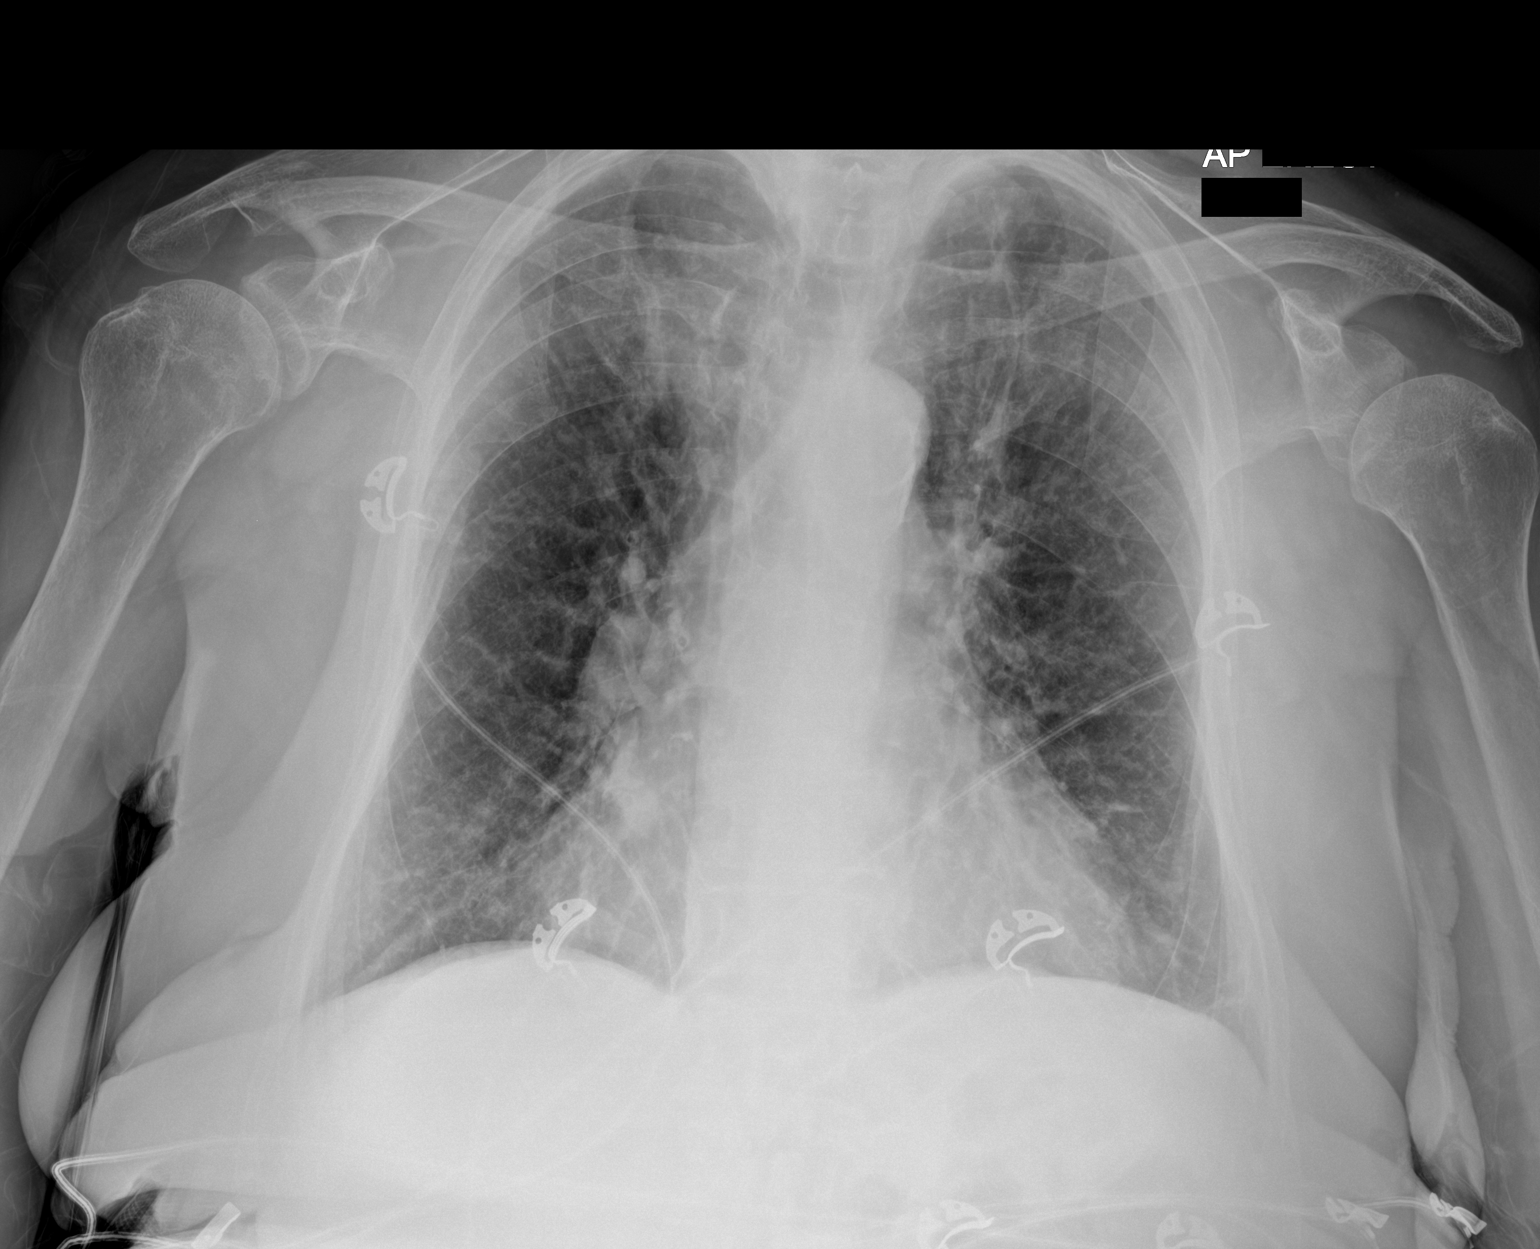

[1 of 1 positions shown; findings below may reference images not displayed]

FINDINGS: Cardiomegaly with central congestion. Mild diffuse interstitial
opacity some of which is suspected to be due to chronic change
though superimposed interstitial edema is possible. No pleural
effusion. Aortic atherosclerosis. No pneumothorax.
IMPRESSION: Cardiomegaly with vascular congestion. Diffuse interstitial process,
some of which is suspected to be chronic, but cannot exclude mild
acute superimposed interstitial edema.

## 2021-03-10 ENCOUNTER — Other Ambulatory Visit: Payer: Self-pay | Admitting: *Deleted

## 2021-03-10 MED ORDER — ISOSORBIDE MONONITRATE ER 60 MG PO TB24: 60.0000 mg | ORAL_TABLET | Freq: Every day | ORAL | 0 refills | Status: AC

## 2021-03-12 ENCOUNTER — Ambulatory Visit: Payer: Medicare Other | Admitting: Cardiovascular Disease

## 2021-03-28 ENCOUNTER — Other Ambulatory Visit: Payer: Self-pay | Admitting: Cardiovascular Disease

## 2021-04-13 ENCOUNTER — Other Ambulatory Visit: Payer: Self-pay | Admitting: Cardiovascular Disease

## 2021-05-05 ENCOUNTER — Other Ambulatory Visit: Payer: Self-pay

## 2021-05-05 ENCOUNTER — Encounter: Payer: Self-pay | Admitting: Cardiovascular Disease

## 2021-05-05 ENCOUNTER — Ambulatory Visit (INDEPENDENT_AMBULATORY_CARE_PROVIDER_SITE_OTHER): Payer: Medicare Other | Admitting: Cardiovascular Disease

## 2021-05-05 DIAGNOSIS — I350 Nonrheumatic aortic (valve) stenosis: Secondary | ICD-10-CM | POA: Diagnosis not present

## 2021-05-05 DIAGNOSIS — I5032 Chronic diastolic (congestive) heart failure: Secondary | ICD-10-CM | POA: Diagnosis not present

## 2021-05-05 DIAGNOSIS — E785 Hyperlipidemia, unspecified: Secondary | ICD-10-CM

## 2021-05-05 DIAGNOSIS — I10 Essential (primary) hypertension: Secondary | ICD-10-CM | POA: Diagnosis not present

## 2021-05-05 DIAGNOSIS — I779 Disorder of arteries and arterioles, unspecified: Secondary | ICD-10-CM | POA: Diagnosis not present

## 2021-05-05 DIAGNOSIS — I4819 Other persistent atrial fibrillation: Secondary | ICD-10-CM

## 2021-05-05 MED ORDER — SPIRONOLACTONE 25 MG PO TABS
25.0000 mg | ORAL_TABLET | Freq: Every day | ORAL | 5 refills | Status: DC
Start: 2021-05-05 — End: 2021-06-02

## 2021-05-05 NOTE — Patient Instructions (Signed)
Medication Instructions:  Your physician has recommended you make the following change in your medication:   START Spironolactone 25 mg daily. An Rx has been sent to your pharmacy.   *If you need a refill on your cardiac medications before your next appointment, please call your pharmacy*   Lab Work: Your physician recommends that you return for lab work in: 1 week (bmp)  If you have labs (blood work) drawn today and your tests are completely normal, you will receive your results only by: MyChart Message (if you have MyChart) OR A paper copy in the mail If you have any lab test that is abnormal or we need to change your treatment, we will call you to review the results.   Testing/Procedures: None ordered   Follow-Up: At [facility], you and your health needs are our priority.  As part of our continuing mission to provide you with exceptional heart care, we have created designated Provider Care Teams.  These Care Teams include your primary Cardiologist (physician) and Advanced Practice Providers (APPs -  Physician Assistants and Nurse Practitioners) who all work together to provide you with the care you need, when you need it.  We recommend signing up for the patient portal called "MyChart".  Sign up information is provided on this After Visit Summary.  MyChart is used to connect with patients for Virtual Visits (Telemedicine).  Patients are able to view lab/test results, encounter notes, upcoming appointments, etc.  Non-urgent messages can be sent to your provider as well.   To learn more about what you can do with MyChart, go to [email or web address].    Your next appointment:   3 month(s)  The format for your next appointment:   In Person  Provider:   You may see Lorine Bears, MD or one of the following Advanced Practice Providers on your designated Care Team:   Nicolasa Ducking, NP Eula Listen, PA-C Cadence Fransico Michael, PA-C{    Other Instructions N/A

## 2021-05-05 NOTE — Progress Notes (Signed)
Cardiology Office Note   Date:  05/05/2021   ID:  KYEISHA Joan Willis, DOB Jul 18, 1929, MRN [id number]  PCP:  Marguerita Merles, MD  Cardiologist:   Kathlyn Sacramento, MD   Chief Complaint  Patient presents with   Other    6 Month f/u no complaints today. Meds reviewed verbally with pt.      History of Present Illness: Joan Willis is a 88 y.o. female who presents for a follow-up visit regarding persistent atrial fibrillation and chronic diastolic heart failure. She has chronic medical conditions including mild aortic stenosis, congenital atrophy of right kidney with chronic kidney disease, carotid disease status post carotid endarterectomy, essential hypertension, hyperlipidemia, hypothyroidism and asthma. She was hospitalized in April 2019 with acute on chronic diastolic heart failure in the setting of bronchitis and poorly controlled blood pressure.  Echocardiogram showed normal LV systolic function with moderate LVH and grade 2 diastolic dysfunction, mild pulmonary hypertension and aortic sclerosis. She was hospitalized in September 2020 with chest pain and shortness of breath and was noted to be in atrial fibrillation with rapid ventricular response.  Lexiscan Myoview showed small defect of mild severity in the apical and anterior location likely due to breast attenuation and overall was a low risk study.  She had a rash with Eliquis and was switched to Xarelto.   She was hospitalized in May of 2021 with A. fib with RVR. She had posttermination pauses and was referred to Dr. Caryl Comes.  She was treated with flecainide with no recurrent atrial fibrillation.    She is known to have resistant hypertension with atrophied right kidney.  She had renal artery duplex done in July 2021 which showed moderately elevated velocities affecting the left renal artery at 323 cm/s.  Right renal size was only 6.59 cm. We discussed the possibility of proceeding with renal artery angiography and possible  revascularization but the patient and daughter preferred a conservative approach considering her age.  During her last visit with Dr. Caryl Comes, the dose of flecainide was decreased to 25 mg twice daily due to QRS prolongation.  She reports no recurrent palpitations or dizziness since then.  She has been doing reasonably well but blood pressure continues to be elevated.  She reports better readings of blood pressure at home than in the office.  No chest pain or shortness of breath.  Past Medical History:  Diagnosis Date   (HFpEF) heart failure with preserved ejection fraction ([place])    a. TTE 9/20 - EF 55 to [id number], no LVH, diastolic dysfunction, normal RV systolic function, mildly dilated left atrium, mild mitral regurgitation, mild tricuspid regurgitation, mild aortic stenosis, moderately elevated PASP   Asthma    Carotid artery disease ([facility])    Hyperlipemia    Hypertension    Hypothyroid    Nonfunctioning kidney    Left kidney "never developed"   PAF (paroxysmal atrial fibrillation) ([facility])    a.  Diagnosed 9/20; b. CHADS2VASc 6 (CHF, HTN, age x 2, vascular disease, female); c. on Eliquis   Wears hearing aid in right ear    has.  does not wear    Past Surgical History:  Procedure Laterality Date   ABDOMINAL HYSTERECTOMY     APPENDECTOMY     arm surgery     cartotid endaractomy       Current Outpatient Medications  Medication Sig Dispense Refill   acetaminophen (TYLENOL) 325 MG tablet Take 325 mg by mouth every 6 (six) hours as needed for moderate  pain.     albuterol (PROVENTIL HFA;VENTOLIN HFA) 108 (90 Base) MCG/ACT inhaler Inhale 2 puffs into the lungs every 6 (six) hours as needed for wheezing or shortness of breath. 1 Inhaler 3   albuterol (PROVENTIL) (2.5 MG/3ML) 0.083% nebulizer solution Inhale 2.5 mLs into the lungs every 4 (four) hours as needed for wheezing or shortness of breath.   3   amLODipine (NORVASC) 10 MG tablet Take 10 mg by mouth daily.  4   atorvastatin (LIPITOR) 40 MG  tablet Take 40 mg by mouth daily.  3   baclofen (LIORESAL) 10 MG tablet Take 10 mg by mouth 3 (three) times daily.     carvedilol (COREG) 6.25 MG tablet TAKE 1 TABLET BY MOUTH TWICE A DAY 180 tablet 0   flecainide (TAMBOCOR) 50 MG tablet TAKE 1 TABLET BY MOUTH TWICE A DAY 180 tablet 0   FLOVENT HFA 220 MCG/ACT inhaler Inhale 2 puffs into the lungs daily. 1 Inhaler 10   furosemide (LASIX) 20 MG tablet Take 20 mg by mouth daily.  4   hydrALAZINE (APRESOLINE) 100 MG tablet TAKE 1 TABLET (100 MG TOTAL) BY MOUTH 3 (THREE) TIMES DAILY. 270 tablet 0   isosorbide mononitrate (IMDUR) 60 MG 24 hr tablet Take 1 tablet (60 mg total) by mouth daily. 90 tablet 0   levothyroxine (SYNTHROID, LEVOTHROID) 100 MCG tablet Take 100 mcg by mouth daily.  4   lisinopril (PRINIVIL,ZESTRIL) 40 MG tablet Take 40 mg by mouth daily.  1   montelukast (SINGULAIR) 10 MG tablet Take 10 mg by mouth daily.  4   tiZANidine (ZANAFLEX) 2 MG tablet Take 2 mg by mouth 3 (three) times daily.     Vitamin D3 (VITAMIN D) 25 MCG tablet Take 2,000 Units by mouth daily.      XARELTO 15 MG TABS tablet TAKE 1 TABLET (15 MG TOTAL) BY MOUTH DAILY WITH SUPPER. 90 tablet 3   No current facility-administered medications for this visit.    Allergies:   Patient has no known allergies.    Social History:  The patient  reports that she has never smoked. She has never used smokeless tobacco. She reports that she does not drink alcohol and does not use drugs.   Family History:  The patient's family history includes Hypertension in her mother.    ROS:  Please see the history of present illness.   Otherwise, review of systems are positive for none.   All other systems are reviewed and negative.    PHYSICAL EXAM: VS:  BP (!) 184/60 (BP Location: Left Arm, Patient Position: Sitting, Cuff Size: Normal) Comment: AFTER EKG   Pulse (!) 53    Ht 5\' 5"  (1.651 m)    Wt 152 lb 6 oz (69.1 kg)    SpO2 98%    BMI 25.36 kg/m  , BMI Body mass index is 25.36  kg/m. GEN: Well nourished, well developed, in no acute distress  HEENT: normal  Neck: no JVD, carotid bruits, or masses Cardiac: RRR; no  rubs, or gallops,no edema .  2 / 6 systolic murmur in the aortic area Respiratory:  clear to auscultation bilaterally, normal work of breathing GI: soft, nontender, nondistended, + BS MS: no deformity or atrophy  Skin: warm and dry, no rash Neuro:  Strength and sensation are intact Psych: euthymic mood, full affect   EKG:  EKG is ordered today. The ekg ordered today demonstrates sinus bradycardia with left axis deviation, LVH with repolarization abnormalities.  QRS duration  is 132 ms which is improved compared to most recent EKG.   Recent Labs: 02/12/2021: BUN 18; Creatinine, Ser 1.42; Hemoglobin 13.8; Platelets 268; Potassium 3.9; Sodium 144    Lipid Panel    Component Value Date/Time   CHOL 200 08/29/2019 0421   TRIG 98 08/29/2019 0421   HDL 63 08/29/2019 0421   CHOLHDL 3.2 08/29/2019 0421   VLDL 20 08/29/2019 0421   LDLCALC 117 (H) 08/29/2019 0421      Wt Readings from Last 3 Encounters:  05/05/21 152 lb 6 oz (69.1 kg)  02/12/21 154 lb (69.9 kg)  08/14/20 159 lb (72.1 kg)       No flowsheet data found.    ASSESSMENT AND PLAN:  1. Persistent atrial fibrillation: She is maintaining in sinus rhythm with  flecainide and tolerating anticoagulation with Xarelto 15 mg once daily which is appropriate for renal function.  QRS prolongation to slightly improved after decreasing the dose of flecainide to 25 mg twice daily with no evidence of recurrent atrial fibrillation.  Continue close monitoring.  2.  Chronic diastolic heart failure: she appears to be euvolemic on small dose furosemide 20 mg daily.  3.  Aortic stenosis: This was moderate on most recent echocardiogram done in September.  We will plan on repeat echocardiogram in September of this year.  4.  Carotid disease status post carotid endarterectomy.  Stable  5.  Essential  hypertension: She has a component of whitecoat syndrome.  However, blood pressure continues to be significantly elevated.  I elected to add spironolactone 25 mg once daily.  Check basic metabolic profile in 1 week.  6.  Hyperlipidemia: Currently on atorvastatin.  7. Stenosis of the SMA: Does not have any symptoms of chronic mesenteric ischemia. No indication for intervention.    Disposition:   FU with me in 3 months  Signed,  Kathlyn Sacramento, MD  05/05/2021 10:05 AM    [place]

## 2021-05-15 ENCOUNTER — Other Ambulatory Visit (INDEPENDENT_AMBULATORY_CARE_PROVIDER_SITE_OTHER): Payer: Medicare Other

## 2021-05-15 ENCOUNTER — Other Ambulatory Visit: Payer: Self-pay

## 2021-05-15 DIAGNOSIS — I4819 Other persistent atrial fibrillation: Secondary | ICD-10-CM | POA: Diagnosis not present

## 2021-05-15 DIAGNOSIS — I5032 Chronic diastolic (congestive) heart failure: Secondary | ICD-10-CM

## 2021-05-16 LAB — BASIC METABOLIC PANEL
BUN/Creatinine Ratio: 17 (ref 12–28)
BUN: 29 mg/dL — ABNORMAL HIGH (ref 8–27)
CO2: 25 mmol/L (ref 20–29)
Calcium: 9.8 mg/dL (ref 8.7–10.3)
Chloride: 101 mmol/L (ref 96–106)
Creatinine, Ser: 1.74 mg/dL — ABNORMAL HIGH (ref 0.57–1.00)
Glucose: 180 mg/dL — ABNORMAL HIGH (ref 70–99)
Potassium: 3.9 mmol/L (ref 3.5–5.2)
Sodium: 141 mmol/L (ref 134–144)
eGFR: 28 mL/min/{1.73_m2} — ABNORMAL LOW (ref >59)

## 2021-05-22 ENCOUNTER — Telehealth: Payer: Self-pay

## 2021-05-22 DIAGNOSIS — I5032 Chronic diastolic (congestive) heart failure: Secondary | ICD-10-CM

## 2021-05-22 NOTE — Telephone Encounter (Signed)
Patients daughter Harriett Sine made aware of lab results with verbalized understanding.

## 2021-05-22 NOTE — Telephone Encounter (Signed)
-----   Message from Wellington Hampshire, MD sent at 05/22/2021  7:42 AM EST ----- Slight worsening of renal function with initiation of spironolactone is somewhat expected.  Recommend repeat basic metabolic profile in 2 weeks from now to ensure stability.

## 2021-05-22 NOTE — Telephone Encounter (Signed)
Patient made aware of lab results and Dr. Jari Sportsman recommendation. Lab appt for repeat bmp scheduled on 06/05/21 @ 10:30am. Patient verbalized understanding.

## 2021-06-01 ENCOUNTER — Telehealth: Payer: Self-pay | Admitting: Cardiovascular Disease

## 2021-06-01 NOTE — Telephone Encounter (Signed)
Spoke with patients daughter per release form. She reports Joan Willis woke up one night with chest pressure and elevated heart rates. The chest pain resolved but her heart rates have not come back down. They started her on medication and since then heart rates are elevated. She normally runs 40-50 but now it is running 100-110. Daughter reports that previously they tried changing  her medications she did not tolerate it. Patient stopped spironolactone last week because she felt that was the problem. Scheduled her to come in tomorrow to see provider so we can review her medications and check her heart rate & rhythm. She was agreeable with this plan, confirmed appointment, and she had no further questions at this time.

## 2021-06-01 NOTE — Telephone Encounter (Signed)
Pt c/o medication issue:  1. Name of Medication: Spironolactone  2. How are you currently taking this medication (dosage and times per day)? 25 mg daily  3. Are you having a reaction (difficulty breathing--STAT)?   4. What is your medication issue? Patient's daughter states she stopped taking last weak. BP 114/65 109/65 HR is 100 upon rest, when gets up it is 110 States this all started when she started taking this medication.

## 2021-06-02 ENCOUNTER — Ambulatory Visit (INDEPENDENT_AMBULATORY_CARE_PROVIDER_SITE_OTHER): Payer: Medicare Other | Admitting: Cardiovascular Disease

## 2021-06-02 ENCOUNTER — Other Ambulatory Visit: Payer: Self-pay

## 2021-06-02 ENCOUNTER — Encounter: Payer: Self-pay | Admitting: Cardiovascular Disease

## 2021-06-02 DIAGNOSIS — I350 Nonrheumatic aortic (valve) stenosis: Secondary | ICD-10-CM | POA: Diagnosis not present

## 2021-06-02 DIAGNOSIS — I6523 Occlusion and stenosis of bilateral carotid arteries: Secondary | ICD-10-CM | POA: Diagnosis not present

## 2021-06-02 DIAGNOSIS — I10 Essential (primary) hypertension: Secondary | ICD-10-CM

## 2021-06-02 DIAGNOSIS — I4819 Other persistent atrial fibrillation: Secondary | ICD-10-CM

## 2021-06-02 DIAGNOSIS — I5032 Chronic diastolic (congestive) heart failure: Secondary | ICD-10-CM

## 2021-06-02 MED ORDER — AMIODARONE HCL 200 MG PO TABS: 200.0000 mg | ORAL_TABLET | Freq: Two times a day (BID) | ORAL | 1 refills | Status: DC

## 2021-06-02 NOTE — Progress Notes (Signed)
Cardiology Office Note   Date:  06/02/2021   ID:  Joan Willis, DOB 09-02-1929, MRN [id number]  PCP:  Leanna Sato, MD  Cardiologist:   Lorine Bears, MD   Chief Complaint  Patient presents with   Other    Elevated HR/Low BP. Pt D/C Spironolactone due to chest pain, low BP and elevated HR. Meds reviewed verbally with pt.      History of Present Illness: Joan Willis is a 88 y.o. female who presents for a follow-up visit regarding persistent atrial fibrillation and chronic diastolic heart failure. She has chronic medical conditions including mild aortic stenosis, congenital atrophy of right kidney with chronic kidney disease, carotid disease status post carotid endarterectomy, essential hypertension, hyperlipidemia, hypothyroidism and asthma. She was hospitalized in April 2019 with acute on chronic diastolic heart failure in the setting of bronchitis and poorly controlled blood pressure.  Echocardiogram showed normal LV systolic function with moderate LVH and grade 2 diastolic dysfunction, mild pulmonary hypertension and aortic sclerosis. She was hospitalized in September 2020 with chest pain and shortness of breath and was noted to be in atrial fibrillation with rapid ventricular response.  Lexiscan Myoview showed small defect of mild severity in the apical and anterior location likely due to breast attenuation and overall was a low risk study.  She had a rash with Eliquis and was switched to Xarelto.   She was hospitalized in May of 2021 with A. fib with RVR. She had posttermination pauses and was referred to Dr. Graciela Husbands.  She was treated with flecainide with no recurrent atrial fibrillation.    She is known to have resistant hypertension with atrophied right kidney.  She had renal artery duplex done in July 2021 which showed moderately elevated velocities affecting the left renal artery at 323 cm/s.  Right renal size was only 6.59 cm. We discussed the possibility of proceeding  with renal artery angiography and possible revascularization but the patient and daughter preferred a conservative approach considering her age.  The dose of flecainide and was decreased by Dr. Graciela Husbands to 25 mg twice daily due to QRS prolongation. She was seen by me in January and was in sinus rhythm but blood pressure was still very elevated.  I added spironolactone. Over the last 2 weeks, she experienced elevated heart rate and a drop in blood pressure.  She had 1 night where she felt chest pain.  She stopped taking spironolactone but tachycardia persisted.  She had no further chest pain since then.  She does not feel bad today.   Past Medical History:  Diagnosis Date   (HFpEF) heart failure with preserved ejection fraction ([facility])    a. TTE 9/20 - EF 55 to 60%, no LVH, diastolic dysfunction, normal RV systolic function, mildly dilated left atrium, mild mitral regurgitation, mild tricuspid regurgitation, mild aortic stenosis, moderately elevated PASP   Asthma    Carotid artery disease ([facility])    Hyperlipemia    Hypertension    Hypothyroid    Nonfunctioning kidney    Left kidney "never developed"   PAF (paroxysmal atrial fibrillation) ([facility])    a.  Diagnosed 9/20; b. CHADS2VASc 6 (CHF, HTN, age x 2, vascular disease, female); c. on Eliquis   Wears hearing aid in right ear    has.  does not wear    Past Surgical History:  Procedure Laterality Date   ABDOMINAL HYSTERECTOMY     APPENDECTOMY     arm surgery     cartotid endaractomy  Current Outpatient Medications  Medication Sig Dispense Refill   acetaminophen (TYLENOL) 325 MG tablet Take 325 mg by mouth every 6 (six) hours as needed for moderate pain.     albuterol (PROVENTIL HFA;VENTOLIN HFA) 108 (90 Base) MCG/ACT inhaler Inhale 2 puffs into the lungs every 6 (six) hours as needed for wheezing or shortness of breath. 1 Inhaler 3   albuterol (PROVENTIL) (2.5 MG/3ML) 0.083% nebulizer solution Inhale 2.5 mLs into the lungs every 4  (four) hours as needed for wheezing or shortness of breath.   3   amLODipine (NORVASC) 10 MG tablet Take 10 mg by mouth daily.  4   atorvastatin (LIPITOR) 40 MG tablet Take 40 mg by mouth daily.  3   baclofen (LIORESAL) 10 MG tablet Take 10 mg by mouth 3 (three) times daily.     carvedilol (COREG) 6.25 MG tablet TAKE 1 TABLET BY MOUTH TWICE A DAY 180 tablet 0   flecainide (TAMBOCOR) 50 MG tablet Take 25 mg by mouth 2 (two) times daily.     FLOVENT HFA 220 MCG/ACT inhaler Inhale 2 puffs into the lungs daily. 1 Inhaler 10   furosemide (LASIX) 20 MG tablet Take 20 mg by mouth daily.  4   hydrALAZINE (APRESOLINE) 100 MG tablet TAKE 1 TABLET (100 MG TOTAL) BY MOUTH 3 (THREE) TIMES DAILY. 270 tablet 0   isosorbide mononitrate (IMDUR) 60 MG 24 hr tablet Take 1 tablet (60 mg total) by mouth daily. 90 tablet 0   levothyroxine (SYNTHROID, LEVOTHROID) 100 MCG tablet Take 100 mcg by mouth daily.  4   lisinopril (PRINIVIL,ZESTRIL) 40 MG tablet Take 40 mg by mouth daily.  1   montelukast (SINGULAIR) 10 MG tablet Take 10 mg by mouth daily.  4   tiZANidine (ZANAFLEX) 2 MG tablet Take 2 mg by mouth 3 (three) times daily.     Vitamin D3 (VITAMIN D) 25 MCG tablet Take 2,000 Units by mouth daily.      XARELTO 15 MG TABS tablet TAKE 1 TABLET (15 MG TOTAL) BY MOUTH DAILY WITH SUPPER. 90 tablet 3   spironolactone (ALDACTONE) 25 MG tablet Take 1 tablet (25 mg total) by mouth daily. (Patient not taking: Reported on 06/02/2021) 30 tablet 5   No current facility-administered medications for this visit.    Allergies:   Patient has no known allergies.    Social History:  The patient  reports that she has never smoked. She has never used smokeless tobacco. She reports that she does not drink alcohol and does not use drugs.   Family History:  The patient's family history includes Hypertension in her mother.    ROS:  Please see the history of present illness.   Otherwise, review of systems are positive for none.   All  other systems are reviewed and negative.    PHYSICAL EXAM: VS:  BP 110/74 (BP Location: Left Arm, Patient Position: Sitting, Cuff Size: Normal)    Pulse (!) 114    Ht 5\' 5"  (1.651 m)    Wt 150 lb (68 kg)    SpO2 97%    BMI 24.96 kg/m  , BMI Body mass index is 24.96 kg/m. GEN: Well nourished, well developed, in no acute distress  HEENT: normal  Neck: no JVD, carotid bruits, or masses Cardiac: Irregularly irregular and mildly tachycardic; no  rubs, or gallops,no edema .  2 / 6 systolic murmur in the aortic area Respiratory:  clear to auscultation bilaterally, normal work of breathing GI: soft, nontender, nondistended, +  BS MS: no deformity or atrophy  Skin: warm and dry, no rash Neuro:  Strength and sensation are intact Psych: euthymic mood, full affect   EKG:  EKG is ordered today. The ekg ordered today demonstrates atrial fibrillation versus atypical flutter with ventricular rate 114 bpm.  Left bundle branch block.   Recent Labs: 02/12/2021: Hemoglobin 13.8; Platelets 268 05/15/2021: BUN 29; Creatinine, Ser 1.74; Potassium 3.9; Sodium 141    Lipid Panel    Component Value Date/Time   CHOL 200 08/29/2019 0421   TRIG 98 08/29/2019 0421   HDL 63 08/29/2019 0421   CHOLHDL 3.2 08/29/2019 0421   VLDL 20 08/29/2019 0421   LDLCALC 117 (H) 08/29/2019 0421      Wt Readings from Last 3 Encounters:  06/02/21 150 lb (68 kg)  05/05/21 152 lb 6 oz (69.1 kg)  02/12/21 154 lb (69.9 kg)       No flowsheet data found.    ASSESSMENT AND PLAN:  1. Persistent atrial fibrillation: The patient is back into atrial fibrillation.  I do not think flecainide is an option given progression to left bundle branch block and QRS prolongation.  It is no longer effective at the lower dose as well.  Thus, I elected to discontinue flecainide and start amiodarone 200 mg twice daily.  Continue anticoagulation with Xarelto.  She has not missed any dose.   Follow-up in 2 weeks.  If she continues to be in  atrial fibrillation, recommend proceeding with cardioversion.  2.  Chronic diastolic heart failure: she appears to be euvolemic on small dose furosemide 20 mg daily.  3.  Aortic stenosis: This was moderate on most recent echocardiogram done in September.  We will plan on repeat echocardiogram in September of this year.  4.  Carotid disease status post carotid endarterectomy.  Stable  5.  Essential hypertension: Blood pressure dropped significantly over the last 2 weeks.  This could be due to atrial fibrillation.  She stopped taking spironolactone  6.  Hyperlipidemia: Currently on atorvastatin.  7. Stenosis of the SMA: Does not have any symptoms of chronic mesenteric ischemia. No indication for intervention.    Disposition:   FU in 2 weeks.  Signed,  Kathlyn Sacramento, MD  06/02/2021 3:45 PM    [facility]

## 2021-06-02 NOTE — Patient Instructions (Signed)
Medication Instructions:  Your physician has recommended you make the following change in your medication:   1- STOP Flecainide  2- STOP Spironolactone  3- START Amiodarone 200 mg twice a day. An Rx has been sent to your pharmacy   *If you need a refill on your cardiac medications before your next appointment, please call your pharmacy*   Lab Work: None ordered If you have labs (blood work) drawn today and your tests are completely normal, you will receive your results only by: MyChart Message (if you have MyChart) OR A paper copy in the mail If you have any lab test that is abnormal or we need to change your treatment, we will call you to review the results.   Testing/Procedures: None ordered   Follow-Up: At [facility], you and your health needs are our priority.  As part of our continuing mission to provide you with exceptional heart care, we have created designated Provider Care Teams.  These Care Teams include your primary Cardiologist (physician) and Advanced Practice Providers (APPs -  Physician Assistants and Nurse Practitioners) who all work together to provide you with the care you need, when you need it.  We recommend signing up for the patient portal called "MyChart".  Sign up information is provided on this After Visit Summary.  MyChart is used to connect with patients for Virtual Visits (Telemedicine).  Patients are able to view lab/test results, encounter notes, upcoming appointments, etc.  Non-urgent messages can be sent to your provider as well.   To learn more about what you can do with MyChart, go to [email or web address].    Your next appointment:   2 week(s)  The format for your next appointment:   In Person  Provider:   You may see Lorine Bears, MD or one of the following Advanced Practice Providers on your designated Care Team:   Nicolasa Ducking, NP Eula Listen, PA-C Cadence Fransico Michael, [place]   Other Instructions N/A

## 2021-06-05 ENCOUNTER — Other Ambulatory Visit: Payer: Medicare Other

## 2021-06-19 ENCOUNTER — Other Ambulatory Visit: Payer: Self-pay

## 2021-06-19 ENCOUNTER — Encounter: Payer: Self-pay | Admitting: Medical

## 2021-06-19 ENCOUNTER — Ambulatory Visit (INDEPENDENT_AMBULATORY_CARE_PROVIDER_SITE_OTHER): Payer: Medicare Other | Admitting: Medical

## 2021-06-19 VITALS — BP 138/60 | HR 45 | Ht 65.0 in | Wt 152.4 lb

## 2021-06-19 DIAGNOSIS — I5032 Chronic diastolic (congestive) heart failure: Secondary | ICD-10-CM | POA: Diagnosis not present

## 2021-06-19 DIAGNOSIS — I6523 Occlusion and stenosis of bilateral carotid arteries: Secondary | ICD-10-CM | POA: Diagnosis not present

## 2021-06-19 DIAGNOSIS — I350 Nonrheumatic aortic (valve) stenosis: Secondary | ICD-10-CM

## 2021-06-19 DIAGNOSIS — E782 Mixed hyperlipidemia: Secondary | ICD-10-CM

## 2021-06-19 DIAGNOSIS — E785 Hyperlipidemia, unspecified: Secondary | ICD-10-CM

## 2021-06-19 DIAGNOSIS — I4819 Other persistent atrial fibrillation: Secondary | ICD-10-CM | POA: Diagnosis not present

## 2021-06-19 MED ORDER — AMIODARONE HCL 200 MG PO TABS: 200.0000 mg | ORAL_TABLET | Freq: Every day | ORAL | 3 refills | Status: DC

## 2021-06-19 MED ORDER — LISINOPRIL 40 MG PO TABS: 40.0000 mg | ORAL_TABLET | Freq: Every day | ORAL | 3 refills | Status: DC

## 2021-06-19 NOTE — Progress Notes (Signed)
Cardiology Office Note:    Date:  06/19/2021   ID:  SHAELEY YAKLIN, DOB 10-04-1929, MRN [id number]  PCP:  Marguerita Merles, MD  [facility] Cardiologist:  Kathlyn Sacramento, MD  [place] Electrophysiologist:  None   Referring MD: Marguerita Merles, MD   Chief Complaint: 2 week follow-up  History of Present Illness:    Joan Willis is a 88 y.o. 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   Hospitalized in April 2019 with acute on chronic diastolic heart failure in the stting of bronchitis and poorly controlled bp. Echo showed normal LVSF with moderate LVH and G2DD, mild pulmonary HTN and aorti sclerosis.   She was hospitalized in September 2020 with chest pain and shortness of breath and was noted to be in afib with RVR. Lexiscan showed small defect of mild severity in the apical and anterior location likely due to breast attenuation and was overall low risk. She had a rash with Eliquis and was switched to Xarelto.   Hospitalized in May 2021 with Afib RVR. She had post-termination pauses and was referred to Dr. Caryl Comes and was treated with flecainide. Dose was later decreased due to QRS prolongation.   The patient was seen 06/02/21 and was back in afib. Given LBBB and pregression of QRS Flecainide was stopped. Patient self discontinued spironolactone for low blood pressure. She was started on amiodarone 200mg  BID.   Today, EKG shows SB with heart rate of 45bpm. She has been doing well since the last visit. She says it took about a week of amiodarone before she started feeling better. Denies chest pain, shortness of breath, palpitations, LLE, orthopnea.   Past Medical History:  Diagnosis Date   (HFpEF) heart failure with preserved ejection fraction ([place])    a.  TTE 9/20 - EF 55 to 123456, no LVH, diastolic dysfunction, normal RV systolic function, mildly dilated left atrium, mild mitral regurgitation, mild tricuspid regurgitation, mild aortic stenosis, moderately elevated PASP   Asthma    Carotid artery disease (HCC)    Hyperlipemia    Hypertension    Hypothyroid    Nonfunctioning kidney    Left kidney "never developed"   PAF (paroxysmal atrial fibrillation) ([place])    a.  Diagnosed 9/20; b. CHADS2VASc 6 (CHF, HTN, age x 2, vascular disease, female); c. on Eliquis   Wears hearing aid in right ear    has.  does not wear    Past Surgical History:  Procedure Laterality Date   ABDOMINAL HYSTERECTOMY     APPENDECTOMY     arm surgery     cartotid endaractomy      Current Medications: Current Meds  Medication Sig   acetaminophen (TYLENOL) 325 MG tablet Take 325 mg by mouth every 6 (six) hours as needed for moderate pain.   albuterol (PROVENTIL HFA;VENTOLIN HFA) 108 (90 Base) MCG/ACT inhaler Inhale 2 puffs into the lungs every 6 (six) hours as needed for wheezing or shortness of breath.   albuterol (PROVENTIL) (2.5 MG/3ML) 0.083% nebulizer solution Inhale 2.5 mLs into the lungs every 4 (four) hours as needed for wheezing or shortness of breath.    amLODipine (NORVASC) 10 MG tablet Take 10 mg by mouth daily.   atorvastatin (LIPITOR) 40 MG tablet Take 40 mg by mouth daily.   baclofen (LIORESAL) 10 MG tablet Take 10  mg by mouth 3 (three) times daily.   carvedilol (COREG) 6.25 MG tablet TAKE 1 TABLET BY MOUTH TWICE A DAY   FLOVENT HFA 220 MCG/ACT inhaler Inhale 2 puffs into the lungs daily.   furosemide (LASIX) 20 MG tablet Take 20 mg by mouth daily.   hydrALAZINE (APRESOLINE) 100 MG tablet TAKE 1 TABLET (100 MG TOTAL) BY MOUTH 3 (THREE) TIMES DAILY.   isosorbide mononitrate (IMDUR) 60 MG 24 hr tablet Take 1 tablet (60 mg total) by mouth daily.   levothyroxine (SYNTHROID, LEVOTHROID) 100 MCG tablet Take 100 mcg by mouth daily.   montelukast (SINGULAIR)  10 MG tablet Take 10 mg by mouth daily.   tiZANidine (ZANAFLEX) 2 MG tablet Take 2 mg by mouth 3 (three) times daily.   Vitamin D3 (VITAMIN D) 25 MCG tablet Take 2,000 Units by mouth daily.    XARELTO 15 MG TABS tablet TAKE 1 TABLET (15 MG TOTAL) BY MOUTH DAILY WITH SUPPER.   [DISCONTINUED] amiodarone (PACERONE) 200 MG tablet Take 1 tablet (200 mg total) by mouth 2 (two) times daily.   [DISCONTINUED] lisinopril (PRINIVIL,ZESTRIL) 40 MG tablet Take 40 mg by mouth daily.     Allergies:   Patient has no known allergies.   Social History   Socioeconomic History   Marital status: Widowed    Spouse name: Not on file   Number of children: Not on file   Years of education: Not on file   Highest education level: Not on file  Occupational History   Not on file  Tobacco Use   Smoking status: Never   Smokeless tobacco: Never   Tobacco comments:    pt states she has never smoked  Vaping Use   Vaping Use: Never used  Substance and Sexual Activity   Alcohol use: Never   Drug use: Never   Sexual activity: Not on file  Other Topics Concern   Not on file  Social History Narrative   Not on file   Social Determinants of Health   Financial Resource Strain: Not on file  Food Insecurity: Not on file  Transportation Needs: Not on file  Physical Activity: Not on file  Stress: Not on file  Social Connections: Not on file     Family History: The patient's family history includes Hypertension in her mother.  ROS:   Please see the history of present illness.     All other systems reviewed and are negative.  EKGs/Labs/Other Studies Reviewed:    The following studies were reviewed today:  Echo 12/2020  1. Left ventricular ejection fraction, by estimation, is 60 to 65%. The  left ventricle has normal function. The left ventricle has no regional  wall motion abnormalities. There is mild left ventricular hypertrophy.  Left ventricular diastolic parameters  are consistent with Grade II  diastolic dysfunction (pseudonormalization).   2. Right ventricular systolic function is normal. The right ventricular  size is normal. There is moderately elevated pulmonary artery systolic  pressure.   3. Left atrial size was mildly dilated.   4. The mitral valve is normal in structure. Mild mitral valve  regurgitation. No evidence of mitral stenosis.   5. The aortic valve is calcified. Aortic valve regurgitation is not  visualized. Moderate aortic valve stenosis. Aortic valve area, by VTI  measures 1.85 cm. Aortic valve mean gradient measures 22.5 mmHg.   6. The inferior vena cava is normal in size with greater than 50%  respiratory variability, suggesting right atrial pressure of 3 mmHg.  MPI 2022 Narrative & Impression  There was no ST segment deviation noted during stress. Defect 1: There is a small defect of mild severity present in the apical anterior and apex location. This is likely due to breast attenuation. The study is normal. This is a low risk study. The left ventricular ejection fraction is normal (55-65%).   EKG:  EKG is  ordered today.  The ekg ordered today demonstrates SB, 45bpm, nonspecific T wave changes, PRI 154ms, QRS 149ms, possible repol abnormality  Recent Labs: 02/12/2021: Hemoglobin 13.8; Platelets 268 05/15/2021: BUN 29; Creatinine, Ser 1.74; Potassium 3.9; Sodium 141  Recent Lipid Panel    Component Value Date/Time   CHOL 200 08/29/2019 0421   TRIG 98 08/29/2019 0421   HDL 63 08/29/2019 0421   CHOLHDL 3.2 08/29/2019 0421   VLDL 20 08/29/2019 0421   LDLCALC 117 (H) 08/29/2019 0421    Physical Exam:    VS:  BP 138/60 (BP Location: Left Arm, Patient Position: Sitting, Cuff Size: Normal)    Pulse (!) 45    Ht 5\' 5"  (1.651 m)    Wt 152 lb 6 oz (69.1 kg)    SpO2 96%    BMI 25.36 kg/m     Wt Readings from Last 3 Encounters:  06/19/21 152 lb 6 oz (69.1 kg)  06/02/21 150 lb (68 kg)  05/05/21 152 lb 6 oz (69.1 kg)     GEN:  Well nourished, well  developed in no acute distress HEENT: Normal NECK: No JVD; No carotid bruits LYMPHATICS: No lymphadenopathy CARDIAC: bradycardic, RR, +murmur, no rubs, gallops RESPIRATORY:  Clear to auscultation without rales, wheezing or rhonchi  ABDOMEN: Soft, non-tender, non-distended MUSCULOSKELETAL:  No edema; No deformity  SKIN: Warm and dry NEUROLOGIC:  Alert and oriented x 3 PSYCHIATRIC:  Normal affect   ASSESSMENT:    1. Persistent atrial fibrillation ([place])   2. Bilateral carotid artery stenosis   3. Aortic valve stenosis, etiology of cardiac valve disease unspecified   4. Chronic diastolic CHF (congestive heart failure) ([place])   5. Hyperlipidemia LDL goal <70   6. Hyperlipidemia, mixed    PLAN:    In order of problems listed above:  Persistent Afib Started on amiodarone 200mg  BID at the last visit. She is in sinus bradycardia with heart rate of 45bpm. She is asymptomatic. Patient reports say baseline heart rate is low, 45-56bpm. I will decrease amiodarone to 200mg  daily. She is also on Coreg 6.25mg  BID. Continue stroke ppx with Xarelto 15mg  daily. We will see her back in 1 month to evaluate heart rate/rhythm.  Chronic diastolic heart failure Patient is euvolemic on exam. Continue Lasix 20mg  daily, Coreg, Lisinopril.   Aortic stenosis Moderate by echo in 12/2020. No heart failure sxs today. Can follow with annual echo.   Carotid disease S/p carotid endarterectomy.   HTN BP reasonable today. Continue Coreg 6.26mg  BID, hydralazine 100mg  TID, Lisinopril 40mg  daily, Imdur 60mg  daily.   HLD LDL 121 in 2021. Continue Lipitor 40mg  daily.   Disposition: Follow up in 1 month(s) with MD/APP      Signed, Enrique Manganaro Ninfa Meeker, PA-C  06/19/2021 11:17 AM    [facility]

## 2021-06-19 NOTE — Patient Instructions (Signed)
Medication Instructions:  ?Your physician has recommended you make the following change in your medication:  ? ?DECREASE your amiodarone (Pacerone) to 200 mg DAILY  ? ?*If you need a refill on your cardiac medications before your next appointment, please call your pharmacy* ? ? ?Lab Work: ?None ordered ? ?If you have labs (blood work) drawn today and your tests are completely normal, you will receive your results only by: ?MyChart Message (if you have MyChart) OR ?A paper copy in the mail ?If you have any lab test that is abnormal or we need to change your treatment, we will call you to review the results. ? ? ?Testing/Procedures: ?None ordered ? ? ?Follow-Up: ?At [facility], you and your health needs are our priority.  As part of our continuing mission to provide you with exceptional heart care, we have created designated Provider Care Teams.  These Care Teams include your primary Cardiologist (physician) and Advanced Practice Providers (APPs -  Physician Assistants and Nurse Practitioners) who all work together to provide you with the care you need, when you need it. ? ?We recommend signing up for the patient portal called "MyChart".  Sign up information is provided on this After Visit Summary.  MyChart is used to connect with patients for Virtual Visits (Telemedicine).  Patients are able to view lab/test results, encounter notes, upcoming appointments, etc.  Non-urgent messages can be sent to your provider as well.   ?To learn more about what you can do with MyChart, go to [email or web address].   ? ?Your next appointment:   ?1 month(s) ? ?The format for your next appointment:   ?In Person ? ?Provider:   ?You may see Kathlyn Sacramento, MD or one of the following Advanced Practice Providers on your designated Care Team:   ?Murray Hodgkins, NP ?Christell Faith, PA-C ?Cadence Kathlen Mody, PA-C}  ? ? ?Other Instructions ?N/A ?

## 2021-06-24 ENCOUNTER — Other Ambulatory Visit: Payer: Self-pay | Admitting: Cardiovascular Disease

## 2021-06-24 DIAGNOSIS — I4819 Other persistent atrial fibrillation: Secondary | ICD-10-CM

## 2021-07-12 ENCOUNTER — Other Ambulatory Visit: Payer: Self-pay | Admitting: Cardiovascular Disease

## 2021-08-01 ENCOUNTER — Emergency Department: Payer: Medicare Other

## 2021-08-01 ENCOUNTER — Observation Stay
Admission: EM | Admit: 2021-08-01 | Discharge: 2021-08-02 | Disposition: A | Discharge status: Home / Self Care | Payer: Medicare Other | Attending: Internal Medicine | Admitting: Internal Medicine

## 2021-08-01 ENCOUNTER — Other Ambulatory Visit: Payer: Self-pay

## 2021-08-01 ENCOUNTER — Encounter: Payer: Self-pay | Admitting: Emergency Medicine

## 2021-08-01 DIAGNOSIS — J441 Chronic obstructive pulmonary disease with (acute) exacerbation: Secondary | ICD-10-CM | POA: Diagnosis not present

## 2021-08-01 DIAGNOSIS — I130 Hypertensive heart and chronic kidney disease with heart failure and stage 1 through stage 4 chronic kidney disease, or unspecified chronic kidney disease: Secondary | ICD-10-CM | POA: Diagnosis not present

## 2021-08-01 DIAGNOSIS — N1832 Chronic kidney disease, stage 3b: Secondary | ICD-10-CM | POA: Diagnosis not present

## 2021-08-01 DIAGNOSIS — E039 Hypothyroidism, unspecified: Secondary | ICD-10-CM | POA: Diagnosis not present

## 2021-08-01 DIAGNOSIS — J45909 Unspecified asthma, uncomplicated: Secondary | ICD-10-CM | POA: Diagnosis not present

## 2021-08-01 DIAGNOSIS — Z7901 Long term (current) use of anticoagulants: Secondary | ICD-10-CM | POA: Diagnosis not present

## 2021-08-01 DIAGNOSIS — I480 Paroxysmal atrial fibrillation: Secondary | ICD-10-CM | POA: Diagnosis not present

## 2021-08-01 DIAGNOSIS — I5032 Chronic diastolic (congestive) heart failure: Secondary | ICD-10-CM | POA: Diagnosis not present

## 2021-08-01 DIAGNOSIS — Z79899 Other long term (current) drug therapy: Secondary | ICD-10-CM | POA: Insufficient documentation

## 2021-08-01 DIAGNOSIS — R0602 Shortness of breath: Secondary | ICD-10-CM

## 2021-08-01 DIAGNOSIS — I10 Essential (primary) hypertension: Secondary | ICD-10-CM | POA: Diagnosis present

## 2021-08-01 DIAGNOSIS — E785 Hyperlipidemia, unspecified: Secondary | ICD-10-CM | POA: Diagnosis present

## 2021-08-01 LAB — CBC WITH DIFFERENTIAL/PLATELET
Abs Immature Granulocytes: 0.02 10*3/uL (ref 0.00–0.07)
Basophils Absolute: 0.1 10*3/uL (ref 0.0–0.1)
Basophils Relative: 1 %
Eosinophils Absolute: 0.1 10*3/uL (ref 0.0–0.5)
Eosinophils Relative: 2 %
HCT: 36.4 % (ref 36.0–46.0)
Hemoglobin: 11.6 g/dL — ABNORMAL LOW (ref 12.0–15.0)
Immature Granulocytes: 0 %
Lymphocytes Relative: 25 %
Lymphs Abs: 1.8 10*3/uL (ref 0.7–4.0)
MCH: 28.7 pg (ref 26.0–34.0)
MCHC: 31.9 g/dL (ref 30.0–36.0)
MCV: 90.1 fL (ref 80.0–100.0)
Monocytes Absolute: 0.6 10*3/uL (ref 0.1–1.0)
Monocytes Relative: 8 %
Neutro Abs: 4.8 10*3/uL (ref 1.7–7.7)
Neutrophils Relative %: 64 %
Platelets: 246 10*3/uL (ref 150–400)
RBC: 4.04 MIL/uL (ref 3.87–5.11)
RDW: 14.5 % (ref 11.5–15.5)
WBC: 7.4 10*3/uL (ref 4.0–10.5)
nRBC: 0 % (ref 0.0–0.2)

## 2021-08-01 LAB — COMPREHENSIVE METABOLIC PANEL
ALT: 13 U/L (ref 0–44)
AST: 16 U/L (ref 15–41)
Albumin: 3.5 g/dL (ref 3.5–5.0)
Alkaline Phosphatase: 71 U/L (ref 38–126)
Anion gap: 11 (ref 5–15)
BUN: 24 mg/dL — ABNORMAL HIGH (ref 8–23)
CO2: 28 mmol/L (ref 22–32)
Calcium: 9.1 mg/dL (ref 8.9–10.3)
Chloride: 103 mmol/L (ref 98–111)
Creatinine, Ser: 1.59 mg/dL — ABNORMAL HIGH (ref 0.44–1.00)
GFR, Estimated: 31 mL/min — ABNORMAL LOW (ref >60)
Glucose, Bld: 117 mg/dL — ABNORMAL HIGH (ref 70–99)
Potassium: 3.6 mmol/L (ref 3.5–5.1)
Sodium: 142 mmol/L (ref 135–145)
Total Bilirubin: 1.3 mg/dL — ABNORMAL HIGH (ref 0.3–1.2)
Total Protein: 6.9 g/dL (ref 6.5–8.1)

## 2021-08-01 LAB — BRAIN NATRIURETIC PEPTIDE: B Natriuretic Peptide: 359.1 pg/mL — ABNORMAL HIGH (ref 0.0–100.0)

## 2021-08-01 LAB — TROPONIN I (HIGH SENSITIVITY): Troponin I (High Sensitivity): 15 ng/L (ref <18)

## 2021-08-01 MED ORDER — VITAMIN D3 25 MCG PO TABS: 2000.0000 [IU] | ORAL_TABLET | Freq: Every day | ORAL | Status: DC

## 2021-08-01 MED ORDER — HYDRALAZINE HCL 50 MG PO TABS
100.0000 mg | ORAL_TABLET | Freq: Three times a day (TID) | ORAL | Status: DC
  Administered 2021-08-02: 100 mg via ORAL
  Filled 2021-08-01: qty 2

## 2021-08-01 MED ORDER — ISOSORBIDE MONONITRATE ER 60 MG PO TB24
60.0000 mg | ORAL_TABLET | Freq: Every day | ORAL | Status: DC
  Administered 2021-08-02: 60 mg via ORAL
  Filled 2021-08-01: qty 1

## 2021-08-01 MED ORDER — AMLODIPINE BESYLATE 5 MG PO TABS: 10.0000 mg | ORAL_TABLET | Freq: Every day | ORAL | Status: DC

## 2021-08-01 MED ORDER — ATORVASTATIN CALCIUM 20 MG PO TABS: 40.0000 mg | ORAL_TABLET | Freq: Every day | ORAL | Status: DC

## 2021-08-01 MED ORDER — CARVEDILOL 6.25 MG PO TABS: 6.2500 mg | ORAL_TABLET | Freq: Two times a day (BID) | ORAL | Status: DC

## 2021-08-01 MED ORDER — LEVOTHYROXINE SODIUM 50 MCG PO TABS
100.0000 ug | ORAL_TABLET | Freq: Every day | ORAL | Status: DC
  Administered 2021-08-02: 100 ug via ORAL
  Filled 2021-08-01: qty 2

## 2021-08-01 MED ORDER — LISINOPRIL 10 MG PO TABS
40.0000 mg | ORAL_TABLET | Freq: Every day | ORAL | Status: DC
  Administered 2021-08-02: 40 mg via ORAL
  Filled 2021-08-01: qty 4

## 2021-08-01 MED ORDER — AMIODARONE HCL 200 MG PO TABS
200.0000 mg | ORAL_TABLET | Freq: Every day | ORAL | Status: DC
  Administered 2021-08-02: 200 mg via ORAL
  Filled 2021-08-01: qty 1

## 2021-08-01 MED ORDER — FUROSEMIDE 40 MG PO TABS
20.0000 mg | ORAL_TABLET | Freq: Every day | ORAL | Status: DC
  Administered 2021-08-02: 20 mg via ORAL
  Filled 2021-08-01: qty 1

## 2021-08-01 MED ORDER — FLUTICASONE PROPIONATE HFA 220 MCG/ACT IN AERO: 2.0000 | INHALATION_SPRAY | Freq: Every day | RESPIRATORY_TRACT | Status: DC

## 2021-08-01 MED ORDER — ONDANSETRON HCL 4 MG/2ML IJ SOLN: 4.0000 mg | Freq: Four times a day (QID) | INTRAMUSCULAR | Status: DC | PRN

## 2021-08-01 MED ORDER — IPRATROPIUM-ALBUTEROL 0.5-2.5 (3) MG/3ML IN SOLN
3.0000 mL | Freq: Three times a day (TID) | RESPIRATORY_TRACT | Status: DC
  Administered 2021-08-02: 3 mL via RESPIRATORY_TRACT
  Filled 2021-08-01: qty 3

## 2021-08-01 MED ORDER — IPRATROPIUM-ALBUTEROL 0.5-2.5 (3) MG/3ML IN SOLN
9.0000 mL | Freq: Once | RESPIRATORY_TRACT | Status: AC
Start: 2021-08-01 — End: 2021-08-01
  Administered 2021-08-01: 9 mL via RESPIRATORY_TRACT
  Filled 2021-08-01: qty 3

## 2021-08-01 MED ORDER — PREDNISONE 20 MG PO TABS
60.0000 mg | ORAL_TABLET | Freq: Once | ORAL | Status: AC
  Administered 2021-08-01: 60 mg via ORAL
  Filled 2021-08-01: qty 3

## 2021-08-01 MED ORDER — ONDANSETRON HCL 4 MG PO TABS: 4.0000 mg | ORAL_TABLET | Freq: Four times a day (QID) | ORAL | Status: DC | PRN

## 2021-08-01 MED ORDER — MORPHINE SULFATE (PF) 2 MG/ML IV SOLN: 1.0000 mg | INTRAVENOUS | Status: DC | PRN

## 2021-08-01 MED ORDER — HEPARIN SODIUM (PORCINE) 5000 UNIT/ML IJ SOLN
5000.0000 [IU] | Freq: Three times a day (TID) | INTRAMUSCULAR | Status: DC
  Administered 2021-08-01: 5000 [IU] via SUBCUTANEOUS
  Filled 2021-08-01: qty 1

## 2021-08-01 MED ORDER — ALBUTEROL SULFATE (2.5 MG/3ML) 0.083% IN NEBU: 2.5000 mL | INHALATION_SOLUTION | RESPIRATORY_TRACT | Status: DC | PRN

## 2021-08-01 MED ORDER — ACETAMINOPHEN 325 MG RE SUPP: 650.0000 mg | Freq: Four times a day (QID) | RECTAL | Status: DC | PRN

## 2021-08-01 MED ORDER — RIVAROXABAN 15 MG PO TABS: 15.0000 mg | ORAL_TABLET | Freq: Every day | ORAL | Status: DC

## 2021-08-01 MED ORDER — HYDRALAZINE HCL 20 MG/ML IJ SOLN: 10.0000 mg | Freq: Three times a day (TID) | INTRAMUSCULAR | Status: DC | PRN

## 2021-08-01 MED ORDER — MONTELUKAST SODIUM 10 MG PO TABS
10.0000 mg | ORAL_TABLET | Freq: Every day | ORAL | Status: DC
  Administered 2021-08-02: 10 mg via ORAL
  Filled 2021-08-01: qty 1

## 2021-08-01 MED ORDER — ALBUTEROL SULFATE (2.5 MG/3ML) 0.083% IN NEBU
2.5000 mg | INHALATION_SOLUTION | Freq: Once | RESPIRATORY_TRACT | Status: AC
  Administered 2021-08-01: 2.5 mg via RESPIRATORY_TRACT
  Filled 2021-08-01: qty 3

## 2021-08-01 MED ORDER — ACETAMINOPHEN 325 MG PO TABS: 650.0000 mg | ORAL_TABLET | Freq: Four times a day (QID) | ORAL | Status: DC | PRN

## 2021-08-01 NOTE — Assessment & Plan Note (Signed)
-   On Xarelto 15 mg daily ?

## 2021-08-01 NOTE — ED Triage Notes (Signed)
Pt to ED via POV with c/o SHOB and tightening in her chest. Pt states hx of asthma and CHF. Pt states symptoms started today, has been using at home nebulizer without relief. Pt states productive cough. Pt 89-92% RA ? ? ?Pt placed on 2L via [place] in triage ? ?

## 2021-08-01 NOTE — Assessment & Plan Note (Signed)
-   Levothyroxine 100 mcg daily resumed 

## 2021-08-01 NOTE — ED Notes (Signed)
Pt taken off 2LNC by EDP [place]. O2 sats steady at 97 at this time. ?

## 2021-08-01 NOTE — H&P (Addendum)
?History and Physical  ? ?[name]:[id number] DOB: 07-23-1929 DOA: 08/01/2021 ? ?PCP: Leanna Sato, MD  ?Outpatient Specialists: Dr. Kirke Corin, [facility] cardiology ?Patient coming from: Home ? ?I have personally briefly reviewed patient's old medical records in [name] EMR. ? ?Chief Concern: Shortness of breath ? ?HPI: Ms. Joan Willis is 88 years old with hypertension, hypothyroid, hyperlipidemia, asthma, persistent atrial fibrillation, on anticoagulation, mild aortic stenosis, congenital Atrophy of right kidney, CKD stage IIIb, LVH, heart failure with preserved ejection fraction, who presents emergency department for chief concerns of shortness of breath. ? ?Initial vitals in the emergency department showed temperature of 98.4, respiration rate of 24, heart rate of 52, blood pressure 171/58, SPO2 of 92% on room air. ? ?Serum serum sodium is 142, potassium 3.6, chloride 103, bicarb 22 8, BUN of 24, serum creatinine 1.59, nonfasting blood glucose 117, GFR 31, BNP was elevated at 359, troponin was 15.  WBC 7.4, hemoglobin 11.6, platelets of 246. ? ?ED treatment: DuoNebs one-time dose, prednisone 60 mg p.o. ? ?At bedside patient is able to tell me her name, age, current location.  She reports that she started having shortness of breath for 2 days,and she felt really scared today, prompting her to present to the emergency department for further evaluation.  She endorses wheezing and chest tightness that have resolved with breathing treatments.  She denies any new cough, sputum, nausea, vomiting, chest pain, dysuria, hematuria, diarrhea. ? ?She endorses improvement of her symptoms with the breathing treatment and the steroids given by EDP.  She requested that EDP let her stay 1 more day because she is afraid to go home for worsening symptoms again. ? ?ROS: ?Constitutional: no weight change, no fever ?ENT/Mouth: no sore throat, no rhinorrhea ?Eyes: no eye pain, no vision changes ?Cardiovascular: no chest pain, +  dyspnea,  no edema, no palpitations ?Respiratory: no cough, no sputum, + wheezing ?Gastrointestinal: no nausea, no vomiting, no diarrhea, no constipation ?Genitourinary: no urinary incontinence, no dysuria, no hematuria ?Musculoskeletal: no arthralgias, no myalgias ?Skin: no skin lesions, no pruritus, ?Neuro: + weakness, no loss of consciousness, no syncope ?Psych: no anxiety, no depression, no decrease appetite ?Heme/Lymph: no bruising, no bleeding ? ?ED Course: Discussed with EDP, patient requiring hospitalization for chief concerns of COPD exacerbation. ? ?Assessment/Plan ? ?Principal Problem: ?  COPD exacerbation (HCC) ?Active Problems: ?  HTN (hypertension) ?  HLD (hyperlipidemia) ?  Hypothyroidism ?  Chronic diastolic CHF (congestive heart failure) (HCC) ?  Paroxysmal atrial fibrillation (HCC) ?  ?Assessment and Plan: ? ?* COPD exacerbation (HCC) ?- DuoNebs 3 times daily, 2 doses ordered ?- Resumed home inhaler every 4 hours.  For wheezing and shortness of breath, resumed home fluticasone 2 puff inhalation daily, montelukast 10 mg daily ?- Prednisone 40 mg p.o. one-time dose ordered for 4/23 breakfast ?- Flutter valve and incentive spirometry ?- Anticipate discharge tomorrow ? ?Paroxysmal atrial fibrillation (HCC) ?- On Xarelto 15 mg daily ? ?Hypothyroidism ?- Levothyroxine 100 mcg daily resumed ? ?HLD (hyperlipidemia) ?- Atorvastatin 40 mg nightly resumed ? ?HTN (hypertension) ?- Resumed amlodipine 10 mg daily, amiodarone 200 mg daily, carvedilol 6.25 mg p.o. twice daily, hydralazine 100 mg 3 times daily, isosorbide mononitrate 60 mg daily, lisinopril 40 mg daily ?- Hydralazine 10 mg IV every 8 hours as needed for SBP greater than 175, 4 doses ordered ? ?Chart reviewed.  ? ?DVT prophylaxis: Xarelto ?Code Status: Full code ?Diet: Heart healthy ?Family Communication: No ?Disposition Plan: Anticipate discharge on 08/02/2021 ?Consults called: None  at this time ?Admission status: Telemetry medical,  observation ? ?Past Medical History:  ?Diagnosis Date  ? (HFpEF) heart failure with preserved ejection fraction ([place])   ? a. TTE 9/20 - EF 55 to 123456, no LVH, diastolic dysfunction, normal RV systolic function, mildly dilated left atrium, mild mitral regurgitation, mild tricuspid regurgitation, mild aortic stenosis, moderately elevated PASP  ? Asthma   ? Carotid artery disease ([place])   ? Hyperlipemia   ? Hypertension   ? Hypothyroid   ? Nonfunctioning kidney   ? Left kidney "never developed"  ? PAF (paroxysmal atrial fibrillation) ([place])   ? a.  Diagnosed 9/20; b. CHADS2VASc 6 (CHF, HTN, age x 2, vascular disease, female); c. on Eliquis  ? Wears hearing aid in right ear   ? has.  does not wear  ? ?Past Surgical History:  ?Procedure Laterality Date  ? ABDOMINAL HYSTERECTOMY    ? APPENDECTOMY    ? arm surgery    ? cartotid endaractomy    ? ? ?Social History:  reports that she has never smoked. She has never used smokeless tobacco. She reports that she does not drink alcohol and does not use drugs. ? ?No Known Allergies ?Family History  ?Problem Relation Age of Onset  ? Hypertension Mother   ? ?Family history: Family history reviewed and not pertinent ? ?Prior to Admission medications   ?Medication Sig Start Date End Date Taking? Authorizing Provider  ?albuterol (PROVENTIL) (2.5 MG/3ML) 0.083% nebulizer solution Inhale 2.5 mLs into the lungs every 4 (four) hours as needed for wheezing or shortness of breath.  09/13/17  Yes [provider]  ?amiodarone (PACERONE) 200 MG tablet Take 1 tablet (200 mg total) by mouth daily. 06/24/21  Yes Furth, Cadence H, PA-C  ?amLODipine (NORVASC) 10 MG tablet Take 10 mg by mouth daily. 05/04/17  Yes [provider]  ?atorvastatin (LIPITOR) 40 MG tablet Take 40 mg by mouth daily. 04/18/17  Yes [provider]  ?carvedilol (COREG) 6.25 MG tablet TAKE 1 TABLET BY MOUTH TWICE A DAY 07/13/21  Yes Wellington Hampshire, MD  ?FLOVENT HFA 220 MCG/ACT inhaler Inhale 2 puffs into the  lungs daily. 01/17/18  Yes Wilhelmina Mcardle, MD  ?furosemide (LASIX) 20 MG tablet Take 20 mg by mouth daily. 04/29/17  Yes [provider]  ?hydrALAZINE (APRESOLINE) 100 MG tablet TAKE 1 TABLET (100 MG TOTAL) BY MOUTH 3 (THREE) TIMES DAILY. 04/24/19  Yes Dunn, Areta Haber, PA-C  ?isosorbide mononitrate (IMDUR) 60 MG 24 hr tablet Take 1 tablet (60 mg total) by mouth daily. 03/10/21  Yes Wellington Hampshire, MD  ?levothyroxine (SYNTHROID, LEVOTHROID) 100 MCG tablet Take 100 mcg by mouth daily. 04/29/17  Yes [provider]  ?lisinopril (ZESTRIL) 40 MG tablet Take 1 tablet (40 mg total) by mouth daily. 06/19/21  Yes Furth, Cadence H, PA-C  ?montelukast (SINGULAIR) 10 MG tablet Take 10 mg by mouth daily. 07/19/17  Yes [provider]  ?tiZANidine (ZANAFLEX) 2 MG tablet Take 2 mg by mouth 3 (three) times daily. 02/12/20  Yes [provider]  ?Vitamin D3 (VITAMIN D) 25 MCG tablet Take 2,000 Units by mouth daily.    Yes [provider]  ?XARELTO 15 MG TABS tablet TAKE 1 TABLET (15 MG TOTAL) BY MOUTH DAILY WITH SUPPER. 01/11/20  Yes Deboraha Sprang, MD  ?acetaminophen (TYLENOL) 325 MG tablet Take 325 mg by mouth every 6 (six) hours as needed for moderate pain.    [provider]  ?albuterol (PROVENTIL  HFA;VENTOLIN HFA) 108 (90 Base) MCG/ACT inhaler Inhale 2 puffs into the lungs every 6 (six) hours as needed for wheezing or shortness of breath. 10/11/17   Wilhelmina Mcardle, MD  ?baclofen (LIORESAL) 10 MG tablet Take 10 mg by mouth 3 (three) times daily. ?Patient not taking: Reported on 08/01/2021 02/06/20   [provider]  ? ?Physical Exam: ?Vitals:  ? 08/01/21 2056 08/01/21 2057 08/01/21 2200  ?BP: (!) 171/53  (!) 159/49  ?Pulse: (!) 52  (!) 51  ?Resp: (!) 26  15  ?Temp: 98.4 ?F (36.9 ?C)    ?TempSrc: Oral    ?SpO2: 92%  95%  ?Weight:  68.9 kg   ?Height:  5\' 5"  (1.651 m)   ? ?Constitutional: appears age-appropriate, NAD, calm, comfortable ?Eyes: PERRL, lids and conjunctivae  normal ?ENMT: Mucous membranes are moist. Posterior pharynx clear of any exudate or lesions. Age-appropriate dentition.  Moderate to severe hearing loss bilaterally ?Neck: normal, supple, no masses, no thyromegaly ?Respirat

## 2021-08-01 NOTE — Assessment & Plan Note (Signed)
-   Atorvastatin 40 mg nightly resumed 

## 2021-08-01 NOTE — Assessment & Plan Note (Signed)
-   Resumed amlodipine 10 mg daily, amiodarone 200 mg daily, carvedilol 6.25 mg p.o. twice daily, hydralazine 100 mg 3 times daily, isosorbide mononitrate 60 mg daily, lisinopril 40 mg daily ?- Hydralazine 10 mg IV every 8 hours as needed for SBP greater than 175, 4 doses ordered ?

## 2021-08-01 NOTE — Hospital Course (Addendum)
Joan Willis is 88 years old with hypertension, hypothyroid, hyperlipidemia, asthma, persistent atrial fibrillation, on anticoagulation, mild aortic stenosis, congenital Atrophy of right kidney, CKD stage IIIb, LVH, heart failure with preserved ejection fraction, who presents emergency department for chief concerns of shortness of breath. ? ?Initial vitals in the emergency department showed temperature of 98.4, respiration rate of 24, heart rate of 52, blood pressure 171/58, SPO2 of 92% on room air. ? ?Serum serum sodium is 142, potassium 3.6, chloride 103, bicarb 22 8, BUN of 24, serum creatinine 1.59, nonfasting blood glucose 117, GFR 31, BNP was elevated at 359, troponin was 15.  WBC 7.4, hemoglobin 11.6, platelets of 246. ? ?ED treatment: DuoNebs one-time dose, prednisone 60 mg p.o. ?

## 2021-08-01 NOTE — ED Provider Notes (Signed)
? ?[facility] ?Provider Note ? ? ? Event Date/Time  ? First MD Initiated Contact with Patient 08/01/21 2125   ?  (approximate) ? ? ?History  ? ?Chief Complaint ?Shortness of Breath ? ? ?HPI ? ?Joan Willis is a 88 y.o. female with past medical history of hypertension, hyperlipidemia, CAD, CHF, atrial fibrillation on Xarelto, and COPD who presents to the ED complaining of shortness of breath.  Patient reports that over the past 2 days she has been dealing with a dry cough and increasing difficulty breathing.  This has been associated with a feeling of tightness in her chest but she denies any overt chest pain.  She has been using her nebulizer at home with only partial relief of symptoms.  She denies any fevers and has not noticed any pain or swelling in her legs.  She is not aware of any recent sick contacts. ?  ? ? ?Physical Exam  ? ?Triage Vital Signs: ?ED Triage Vitals  ?Enc Vitals Group  ?   BP 08/01/21 2056 (!) 171/53  ?   Pulse Rate 08/01/21 2056 (!) 52  ?   Resp 08/01/21 2056 (!) 26  ?   Temp 08/01/21 2056 98.4 ?F (36.9 ?C)  ?   Temp Source 08/01/21 2056 Oral  ?   SpO2 08/01/21 2056 92 %  ?   Weight 08/01/21 2057 152 lb (68.9 kg)  ?   Height 08/01/21 2057 5\' 5"  (1.651 m)  ?   Head Circumference --   ?   Peak Flow --   ?   Pain Score 08/01/21 2056 8  ?   Pain Loc --   ?   Pain Edu? --   ?   Excl. in [place]? --   ? ? ?Most recent vital signs: ?Vitals:  ? 08/01/21 2056 08/01/21 2200  ?BP: (!) 171/53 (!) 159/49  ?Pulse: (!) 52 (!) 51  ?Resp: (!) 26 15  ?Temp: 98.4 ?F (36.9 ?C)   ?SpO2: 92% 95%  ? ? ?Constitutional: Alert and oriented. ?Eyes: Conjunctivae are normal. ?Head: Atraumatic. ?Nose: No congestion/rhinnorhea. ?Mouth/Throat: Mucous membranes are moist.  ?Cardiovascular: Normal rate, regular rhythm. Grossly normal heart sounds.  2+ radial pulses bilaterally. ?Respiratory: Normal respiratory effort.  No retractions. Lungs with expiratory wheezing throughout. ?Gastrointestinal: Soft and  nontender. No distention. ?Musculoskeletal: No lower extremity tenderness nor edema.  ?Neurologic:  Normal speech and language. No gross focal neurologic deficits are appreciated. ? ? ? ?ED Results / Procedures / Treatments  ? ?Labs ?(all labs ordered are listed, but only abnormal results are displayed) ?Labs Reviewed  ?CBC WITH DIFFERENTIAL/PLATELET - Abnormal; Notable for the following components:  ?    Result Value  ? Hemoglobin 11.6 (*)   ? All other components within normal limits  ?COMPREHENSIVE METABOLIC PANEL - Abnormal; Notable for the following components:  ? Glucose, Bld 117 (*)   ? BUN 24 (*)   ? Creatinine, Ser 1.59 (*)   ? Total Bilirubin 1.3 (*)   ? GFR, Estimated 31 (*)   ? All other components within normal limits  ?BRAIN NATRIURETIC PEPTIDE - Abnormal; Notable for the following components:  ? B Natriuretic Peptide 359.1 (*)   ? All other components within normal limits  ?BASIC METABOLIC PANEL  ?CBC  ?TROPONIN I (HIGH SENSITIVITY)  ?TROPONIN I (HIGH SENSITIVITY)  ? ? ? ?EKG ? ?ED ECG REPORT ?Tempie Hoist, the attending physician, personally viewed and interpreted this ECG. ? ? Date: 08/01/2021 ?  EKG Time: 21:01 ? Rate: 51 ? Rhythm: sinus bradycardia ? Axis: LAD ? Intervals:first-degree A-V block  and nonspecific intraventricular conduction delay ? ST&T Change: None ? ?RADIOLOGY ?Chest x-ray reviewed by me with no infiltrate, edema, or effusion. ? ?PROCEDURES: ? ?Critical Care performed: No ? ?Procedures ? ? ?MEDICATIONS ORDERED IN ED: ?Medications  ?albuterol (PROVENTIL) (2.5 MG/3ML) 0.083% nebulizer solution 2.5 mg (has no administration in time range)  ?atorvastatin (LIPITOR) tablet 40 mg (has no administration in time range)  ?levothyroxine (SYNTHROID) tablet 100 mcg (has no administration in time range)  ?fluticasone (FLOVENT HFA) 220 MCG/ACT inhaler 2 puff (has no administration in time range)  ?montelukast (SINGULAIR) tablet 10 mg (has no administration in time range)  ?acetaminophen  (TYLENOL) tablet 650 mg (has no administration in time range)  ?  Or  ?acetaminophen (TYLENOL) suppository 650 mg (has no administration in time range)  ?ondansetron (ZOFRAN) tablet 4 mg (has no administration in time range)  ?  Or  ?ondansetron (ZOFRAN) injection 4 mg (has no administration in time range)  ?heparin injection 5,000 Units (has no administration in time range)  ?morphine (PF) 2 MG/ML injection 1 mg (has no administration in time range)  ?ipratropium-albuterol (DUONEB) 0.5-2.5 (3) MG/3ML nebulizer solution 3 mL (has no administration in time range)  ?ipratropium-albuterol (DUONEB) 0.5-2.5 (3) MG/3ML nebulizer solution 9 mL (9 mLs Nebulization Given 08/01/21 2159)  ?predniSONE (DELTASONE) tablet 60 mg (60 mg Oral Given 08/01/21 2159)  ? ? ? ?IMPRESSION / MDM / ASSESSMENT AND PLAN / ED COURSE  ?I reviewed the triage vital signs and the nursing notes. ?             ?               ? ?88 y.o. female with past medical history of hypertension, hyperlipidemia, CAD, CHF, atrial fibrillation on Xarelto, and COPD who presents to the ED complaining of increasing difficulty breathing with cough and chest tightness over the past 2 days. ? ?Differential diagnosis includes, but is not limited to, COPD exacerbation, pneumonia, ACS, PE, CHF. ? ?Patient nontoxic-appearing and in no acute distress, she was initially noted to be borderline hypoxic on room air and placed on 2 L nasal cannula.  O2 sats are now 100% on 2 L and patient weaned to room air.  Symptoms consistent with COPD exacerbation and she was given dose of prednisone along with DuoNebs.  Chest x-ray is unremarkable and labs are reassuring, troponin within normal limits, BMP with stable chronic kidney disease, CBC without significant anemia or leukocytosis. ? ?On reassessment, wheezing is improved but patient continues to get out of breath with any exertion.  Case discussed with hospitalist for admission for ongoing treatment of COPD exacerbation. ? ?   ? ? ?FINAL CLINICAL IMPRESSION(S) / ED DIAGNOSES  ? ?Final diagnoses:  ?COPD exacerbation ([place])  ?Shortness of breath  ? ? ? ?Rx / DC Orders  ? ?ED Discharge Orders   ? ? None  ? ?  ? ? ? ?Note:  This document was prepared using Dragon voice recognition software and may include unintentional dictation errors. ?  ?Blake Divine, MD ?08/01/21 2324 ? ?

## 2021-08-01 NOTE — Assessment & Plan Note (Addendum)
-   DuoNebs 3 times daily, 2 doses ordered ?- Resumed home inhaler every 4 hours.  For wheezing and shortness of breath, resumed home fluticasone 2 puff inhalation daily, montelukast 10 mg daily ?- Prednisone 40 mg p.o. one-time dose ordered for 4/23 breakfast ?- Flutter valve and incentive spirometry ?- Anticipate discharge tomorrow ?

## 2021-08-02 DIAGNOSIS — J441 Chronic obstructive pulmonary disease with (acute) exacerbation: Secondary | ICD-10-CM | POA: Diagnosis not present

## 2021-08-02 LAB — CBC
HCT: 37.1 % (ref 36.0–46.0)
Hemoglobin: 12.1 g/dL (ref 12.0–15.0)
MCH: 29 pg (ref 26.0–34.0)
MCHC: 32.6 g/dL (ref 30.0–36.0)
MCV: 89 fL (ref 80.0–100.0)
Platelets: 252 10*3/uL (ref 150–400)
RBC: 4.17 MIL/uL (ref 3.87–5.11)
RDW: 14.4 % (ref 11.5–15.5)
WBC: 5.4 10*3/uL (ref 4.0–10.5)
nRBC: 0 % (ref 0.0–0.2)

## 2021-08-02 LAB — TROPONIN I (HIGH SENSITIVITY): Troponin I (High Sensitivity): 12 ng/L (ref <18)

## 2021-08-02 LAB — BASIC METABOLIC PANEL
Anion gap: 5 (ref 5–15)
BUN: 24 mg/dL — ABNORMAL HIGH (ref 8–23)
CO2: 25 mmol/L (ref 22–32)
Calcium: 8.8 mg/dL — ABNORMAL LOW (ref 8.9–10.3)
Chloride: 108 mmol/L (ref 98–111)
Creatinine, Ser: 1.47 mg/dL — ABNORMAL HIGH (ref 0.44–1.00)
GFR, Estimated: 34 mL/min — ABNORMAL LOW (ref >60)
Glucose, Bld: 162 mg/dL — ABNORMAL HIGH (ref 70–99)
Potassium: 3.6 mmol/L (ref 3.5–5.1)
Sodium: 138 mmol/L (ref 135–145)

## 2021-08-02 MED ORDER — PREDNISONE 20 MG PO TABS: 40.0000 mg | ORAL_TABLET | Freq: Every day | ORAL | Status: DC

## 2021-08-02 MED ORDER — DM-GUAIFENESIN ER 30-600 MG PO TB12: 1.0000 | ORAL_TABLET | Freq: Two times a day (BID) | ORAL | Status: DC | PRN

## 2021-08-02 MED ORDER — VITAMIN D 25 MCG (1000 UNIT) PO TABS
2000.0000 [IU] | ORAL_TABLET | Freq: Every day | ORAL | Status: DC
  Administered 2021-08-02: 2000 [IU] via ORAL
  Filled 2021-08-02: qty 2

## 2021-08-02 MED ORDER — METOPROLOL TARTRATE 5 MG/5ML IV SOLN: 5.0000 mg | INTRAVENOUS | Status: DC | PRN

## 2021-08-02 MED ORDER — PREDNISONE 20 MG PO TABS: 40.0000 mg | ORAL_TABLET | Freq: Every day | ORAL | 0 refills | Status: DC

## 2021-08-02 MED ORDER — SENNOSIDES-DOCUSATE SODIUM 8.6-50 MG PO TABS
1.0000 | ORAL_TABLET | Freq: Every evening | ORAL | Status: DC | PRN
Start: 2021-08-02 — End: 2021-08-02

## 2021-08-02 MED ORDER — HYDRALAZINE HCL 20 MG/ML IJ SOLN: 10.0000 mg | INTRAMUSCULAR | Status: DC | PRN

## 2021-08-02 MED ORDER — PREDNISONE 20 MG PO TABS
40.0000 mg | ORAL_TABLET | Freq: Every day | ORAL | Status: AC
  Administered 2021-08-02: 40 mg via ORAL
  Filled 2021-08-02: qty 2

## 2021-08-02 NOTE — ED Notes (Signed)
Messaged Dr. Reesa Chew, MD regarding pt's blood pressure and HR, last HR 57 and BP 148/46. Pt on multiple blood pressure medications and rate control medications. Per Dr. Reesa Chew, "hold coreg and norvasc and give the rest. Maybe 2 hrs later if BP is > 130; you can go ahead and give Norvasc." This RN will hold Coreg and Novasc at this time. Will continue to monitor  ?

## 2021-08-02 NOTE — Discharge Summary (Signed)
Physician Discharge Summary  ?Joan Willis [id number] DOB: [birth year] DOA: 08/01/2021 ? ?PCP: Marguerita Merles, MD ? ?Admit date: 08/01/2021 ?Discharge date: 08/02/2021 ? ?Admitted From: Home ?Disposition: Home ? ?Recommendations for Outpatient Follow-up:  ?Follow up with PCP in 5 days ?Please obtain BMP/CBC in one week your next doctors visit.  ?Steroids for home has been prescribed.  She tells me that she has home bronchodilators and have given her instructions how to use it-at least 3-4 times daily until the end of the week. ? ? ?Discharge Condition: Stable ?CODE STATUS: Full code ?Diet recommendation: Heart healthy ? ?Brief/Interim Summary: ?88 years old with hypertension, hypothyroid, hyperlipidemia, asthma, persistent atrial fibrillation, on anticoagulation, mild aortic stenosis, congenital Atrophy of right kidney, CKD stage IIIb, LVH, heart failure with preserved ejection fraction, who presents emergency department for chief concerns of shortness of breath.  She was diagnosed with COPD exacerbation started on steroids and bronchodilators.  Over the course of 12 hours in the hospital her symptoms significantly improved and felt much better.  Today she is medically stable to be discharged with outpatient follow-up. ? ? ?Assessment and Plan: ?* COPD exacerbation ([place]) ?-Significantly improved.  Advised to continue using home bronchodilators 3-4 times daily at least for next 5 to 7 days and follow-up with PCP.  Oral prednisone has been prescribed. ? ?Paroxysmal atrial fibrillation ([place]) ?-Continue daily Xarelto ? ?Hypothyroidism ?- Levothyroxine 100 mcg daily ? ?HLD (hyperlipidemia) ?-Statin ? ?HTN (hypertension) ?- Resumed amlodipine 10 mg daily, amiodarone 200 mg daily, carvedilol 6.25 mg p.o. twice daily, hydralazine 100 mg 3 times daily, isosorbide mononitrate 60 mg daily, lisinopril 40 mg daily ? ? ? ? ? ?  ?Body mass index is 25.29 kg/m?. ? ?  ? ? ? ?Discharge Diagnoses:  ?Principal Problem: ?  COPD  exacerbation ([place]) ?Active Problems: ?  HTN (hypertension) ?  HLD (hyperlipidemia) ?  Hypothyroidism ?  Chronic diastolic CHF (congestive heart failure) ([place]) ?  Paroxysmal atrial fibrillation ([facility]) ? ? ? ? ? ?Consultations: ?None ? ?Subjective: ?Feels great back to her baseline, wishes to go home. ? ?Discharge Exam: ?Vitals:  ? 08/02/21 0900 08/02/21 1000  ?BP: (!) 148/46 (!) 137/52  ?Pulse: (!) 59 (!) 54  ?Resp: 17 15  ?Temp:    ?SpO2: 97% 97%  ? ?Vitals:  ? 08/02/21 0700 08/02/21 0800 08/02/21 0900 08/02/21 1000  ?BP: (!) 161/53 (!) 149/57 (!) 148/46 (!) 137/52  ?Pulse: (!) 58 63 (!) 59 (!) 54  ?Resp: 16 (!) 21 17 15   ?Temp:      ?TempSrc:      ?SpO2: 96% 96% 97% 97%  ?Weight:      ?Height:      ? ? ?General: Pt is alert, awake, not in acute distress ?Cardiovascular: RRR, S1/S2 +, no rubs, no gallops ?Respiratory: CTA bilaterally, no wheezing, no rhonchi ?Abdominal: Soft, NT, ND, bowel sounds + ?Extremities: no edema, no cyanosis ? ?Discharge Instructions ? ? ?Allergies as of 08/02/2021   ?No Known Allergies ?  ? ?  ?Medication List  ?  ? ?TAKE these medications   ? ?acetaminophen 325 MG tablet ?Commonly known as: TYLENOL ?Take 325 mg by mouth every 6 (six) hours as needed for moderate pain. ?  ?albuterol (2.5 MG/3ML) 0.083% nebulizer solution ?Commonly known as: PROVENTIL ?Inhale 2.5 mLs into the lungs every 4 (four) hours as needed for wheezing or shortness of breath. ?  ?albuterol 108 (90 Base) MCG/ACT inhaler ?Commonly known as: VENTOLIN HFA ?Inhale 2 puffs into  the lungs every 6 (six) hours as needed for wheezing or shortness of breath. ?  ?amiodarone 200 MG tablet ?Commonly known as: PACERONE ?Take 1 tablet (200 mg total) by mouth daily. ?  ?amLODipine 10 MG tablet ?Commonly known as: NORVASC ?Take 10 mg by mouth daily. ?  ?atorvastatin 40 MG tablet ?Commonly known as: LIPITOR ?Take 40 mg by mouth daily. ?  ?baclofen 10 MG tablet ?Commonly known as: LIORESAL ?Take 10 mg by mouth 3 (three) times daily. ?   ?carvedilol 6.25 MG tablet ?Commonly known as: COREG ?TAKE 1 TABLET BY MOUTH TWICE A DAY ?  ?Flovent HFA 220 MCG/ACT inhaler ?Generic drug: fluticasone ?Inhale 2 puffs into the lungs daily. ?  ?furosemide 20 MG tablet ?Commonly known as: LASIX ?Take 20 mg by mouth daily. ?  ?hydrALAZINE 100 MG tablet ?Commonly known as: APRESOLINE ?TAKE 1 TABLET (100 MG TOTAL) BY MOUTH 3 (THREE) TIMES DAILY. ?  ?isosorbide mononitrate 60 MG 24 hr tablet ?Commonly known as: IMDUR ?Take 1 tablet (60 mg total) by mouth daily. ?  ?levothyroxine 100 MCG tablet ?Commonly known as: SYNTHROID ?Take 100 mcg by mouth daily. ?  ?lisinopril 40 MG tablet ?Commonly known as: ZESTRIL ?Take 1 tablet (40 mg total) by mouth daily. ?  ?montelukast 10 MG tablet ?Commonly known as: SINGULAIR ?Take 10 mg by mouth daily. ?  ?predniSONE 20 MG tablet ?Commonly known as: DELTASONE ?Take 2 tablets (40 mg total) by mouth daily with breakfast. ?Start taking on: August 03, 2021 ?  ?tiZANidine 2 MG tablet ?Commonly known as: ZANAFLEX ?Take 2 mg by mouth 3 (three) times daily. ?  ?Vitamin D3 25 MCG tablet ?Commonly known as: Vitamin D ?Take 2,000 Units by mouth daily. ?  ?Xarelto 15 MG Tabs tablet ?Generic drug: Rivaroxaban ?TAKE 1 TABLET (15 MG TOTAL) BY MOUTH DAILY WITH SUPPER. ?  ? ?  ? ? Follow-up Information   ? ? Marguerita Merles, MD Follow up in 1 week(s).   ?Specialty: Family Medicine ?Contact information: ?[address] ?[place] [address] ?[phone] ? ? ?  ?  ? ? Wellington Hampshire, MD .   ?Specialty: Cardiology ?Contact information: ?[address] ?[address] ?[place] [address] ?[phone] ? ? ?  ?  ? ?  ?  ? ?  ? ?No Known Allergies ? ?You were cared for by a hospitalist during your hospital stay. If you have any questions about your discharge medications or the care you received while you were in the hospital after you are discharged, you can call the unit and asked to speak with the hospitalist on call if the hospitalist that took  care of you is not available. Once you are discharged, your primary care physician will handle any further medical issues. Please note that no refills for any discharge medications will be authorized once you are discharged, as it is imperative that you return to your primary care physician (or establish a relationship with a primary care physician if you do not have one) for your aftercare needs so that they can reassess your need for medications and monitor your lab values. ? ? ?Procedures/Studies: ?DG Chest 2 View ? ?Result Date: 08/01/2021 ?CLINICAL DATA:  Shortness of breath EXAM: CHEST - 2 VIEW COMPARISON:  08/28/2019 FINDINGS: Cardiomegaly. No confluent airspace opacities or effusions. No overt edema. Aortic atherosclerosis. No acute bony abnormality. IMPRESSION: Cardiomegaly.  No active disease. Electronically Signed   By: Rolm Baptise M.D.   On: 08/01/2021 21:34   ? ? ?  The results of significant diagnostics from this hospitalization (including imaging, microbiology, ancillary and laboratory) are listed below for reference.   ? ? ?Microbiology: ?No results found for this or any previous visit (from the past 240 hour(s)).  ? ?Labs: ?BNP (last 3 results) ?Recent Labs  ?  08/01/21 ?2059  ?BNP 359.1*  ? ?Basic Metabolic Panel: ?Recent Labs  ?Lab 08/01/21 ?2059 08/02/21 ?ID:[id number]  ?NA 142 138  ?K 3.6 3.6  ?CL 103 108  ?CO2 28 25  ?GLUCOSE 117* 162*  ?BUN 24* 24*  ?CREATININE 1.59* 1.47*  ?CALCIUM 9.1 8.8*  ? ?Liver Function Tests: ?Recent Labs  ?Lab 08/01/21 ?2059  ?AST 16  ?ALT 13  ?ALKPHOS 71  ?BILITOT 1.3*  ?PROT 6.9  ?ALBUMIN 3.5  ? ?No results for input(s): LIPASE, AMYLASE in the last 168 hours. ?No results for input(s): AMMONIA in the last 168 hours. ?CBC: ?Recent Labs  ?Lab 08/01/21 ?2059 08/02/21 ?ID:[id number]  ?WBC 7.4 5.4  ?NEUTROABS 4.8  --   ?HGB 11.6* 12.1  ?HCT 36.4 37.1  ?MCV 90.1 89.0  ?PLT 246 252  ? ?Cardiac Enzymes: ?No results for input(s): CKTOTAL, CKMB, CKMBINDEX, TROPONINI in the last 168  hours. ?BNP: ?Invalid input(s): POCBNP ?CBG: ?No results for input(s): GLUCAP in the last 168 hours. ?D-Dimer ?No results for input(s): DDIMER in the last 72 hours. ?Hgb A1c ?No results for input(s): HGBA1C in the last 72 hours

## 2021-08-02 NOTE — ED Notes (Signed)
Dr. Nelson Chimes at bedside at this time. ?

## 2021-08-02 NOTE — ED Notes (Signed)
Breakfast tray delivered

## 2021-08-02 NOTE — ED Notes (Signed)
Pt ambulatory to the restroom with standby assistance and a walker ?

## 2021-08-06 ENCOUNTER — Ambulatory Visit: Payer: Medicare Other | Admitting: Cardiovascular Disease

## 2021-08-25 ENCOUNTER — Ambulatory Visit: Payer: Medicare Other | Admitting: Internal Medicine

## 2021-09-08 ENCOUNTER — Encounter: Payer: Self-pay | Admitting: Cardiovascular Disease

## 2021-09-08 ENCOUNTER — Ambulatory Visit (INDEPENDENT_AMBULATORY_CARE_PROVIDER_SITE_OTHER): Payer: Medicare Other | Admitting: Cardiovascular Disease

## 2021-09-08 VITALS — BP 158/50 | HR 46 | Ht 65.0 in | Wt 154.0 lb

## 2021-09-08 DIAGNOSIS — I10 Essential (primary) hypertension: Secondary | ICD-10-CM

## 2021-09-08 DIAGNOSIS — E785 Hyperlipidemia, unspecified: Secondary | ICD-10-CM

## 2021-09-08 DIAGNOSIS — I480 Paroxysmal atrial fibrillation: Secondary | ICD-10-CM

## 2021-09-08 DIAGNOSIS — I4819 Other persistent atrial fibrillation: Secondary | ICD-10-CM | POA: Diagnosis not present

## 2021-09-08 DIAGNOSIS — I350 Nonrheumatic aortic (valve) stenosis: Secondary | ICD-10-CM | POA: Diagnosis not present

## 2021-09-08 DIAGNOSIS — I5032 Chronic diastolic (congestive) heart failure: Secondary | ICD-10-CM | POA: Diagnosis not present

## 2021-09-08 MED ORDER — CARVEDILOL 3.125 MG PO TABS: 3.1250 mg | ORAL_TABLET | Freq: Two times a day (BID) | ORAL | 5 refills | Status: DC

## 2021-09-08 NOTE — Patient Instructions (Signed)
Medication Instructions:  Your physician has recommended you make the following change in your medication:   DECREASE Carvedilol to 3.125 mg twice a day.  *If you need a refill on your cardiac medications before your next appointment, please call your pharmacy*   Lab Work: None ordered If you have labs (blood work) drawn today and your tests are completely normal, you will receive your results only by: MyChart Message (if you have MyChart) OR A paper copy in the mail If you have any lab test that is abnormal or we need to change your treatment, we will call you to review the results.   Testing/Procedures: Your physician has requested that you have an echocardiogram. Echocardiography is a painless test that uses sound waves to create images of your heart. It provides your doctor with information about the size and shape of your heart and how well your heart's chambers and valves are working. This procedure takes approximately one hour. There are no restrictions for this procedure. (To be scheduled in 4 months)   Follow-Up: At [facility], you and your health needs are our priority.  As part of our continuing mission to provide you with exceptional heart care, we have created designated Provider Care Teams.  These Care Teams include your primary Cardiologist (physician) and Advanced Practice Providers (APPs -  Physician Assistants and Nurse Practitioners) who all work together to provide you with the care you need, when you need it.  We recommend signing up for the patient portal called "MyChart".  Sign up information is provided on this After Visit Summary.  MyChart is used to connect with patients for Virtual Visits (Telemedicine).  Patients are able to view lab/test results, encounter notes, upcoming appointments, etc.  Non-urgent messages can be sent to your provider as well.   To learn more about what you can do with MyChart, go to [email or web address].    Your next appointment:    4 month(s) (To be scheduled after the echo)  The format for your next appointment:   In Person  Provider:   You may see Lorine Bears, MD or one of the following Advanced Practice Providers on your designated Care Team:   Nicolasa Ducking, NP Eula Listen, PA-C Cadence Fransico Michael, [place]   Other Instructions N/A  Important Information About Sugar

## 2021-09-08 NOTE — Progress Notes (Unsigned)
Cardiology Office Note   Date:  09/10/2021   ID:  DIANDRA HEDEEN, DOB 03-08-1930, MRN [id number]  PCP:  Leanna Sato, MD  Cardiologist:   Lorine Bears, MD   Chief Complaint  Patient presents with   Follow-up    1 month F/U-Patient does not have any new cardiac concerns.      History of Present Illness: MYIA KEGEL is a 88 y.o. female who presents for a follow-up visit regarding persistent atrial fibrillation and chronic diastolic heart failure. She has chronic medical conditions including mild aortic stenosis, congenital atrophy of right kidney with chronic kidney disease, carotid disease status post carotid endarterectomy, essential hypertension, hyperlipidemia, hypothyroidism and asthma. She was hospitalized in April 2019 with acute on chronic diastolic heart failure in the setting of bronchitis and poorly controlled blood pressure.  Echocardiogram showed normal LV systolic function with moderate LVH and grade 2 diastolic dysfunction, mild pulmonary hypertension and aortic sclerosis. She was hospitalized in September 2020 with chest pain and shortness of breath and was noted to be in atrial fibrillation with rapid ventricular response.  Lexiscan Myoview showed small defect of mild severity in the apical and anterior location likely due to breast attenuation and overall was a low risk study.  She had a rash with Eliquis and was switched to Xarelto.   She was hospitalized in May of 2021 with A. fib with RVR. She had posttermination pauses and was referred to Dr. Graciela Husbands.  She was treated with flecainide with no recurrent atrial fibrillation.    She is known to have resistant hypertension with atrophied right kidney.  She had renal artery duplex done in July 2021 which showed moderately elevated velocities affecting the left renal artery at 323 cm/s.  Right renal size was only 6.59 cm. We discussed the possibility of proceeding with renal artery angiography and possible  revascularization but the patient and daughter preferred a conservative approach considering her age.  She was switched from flecainide to amiodarone due to recurrent atrial fibrillation and has been doing well since the switch.  No chest pain or shortness of breath.  No palpitations.  She is maintaining in sinus rhythm.  She is chronically bradycardic but overall is asymptomatic.   Past Medical History:  Diagnosis Date   (HFpEF) heart failure with preserved ejection fraction (HCC)    a. TTE 9/20 - EF 55 to 60%, no LVH, diastolic dysfunction, normal RV systolic function, mildly dilated left atrium, mild mitral regurgitation, mild tricuspid regurgitation, mild aortic stenosis, moderately elevated PASP   Asthma    Carotid artery disease (HCC)    Hyperlipemia    Hypertension    Hypothyroid    Nonfunctioning kidney    Left kidney "never developed"   PAF (paroxysmal atrial fibrillation) (HCC)    a.  Diagnosed 9/20; b. CHADS2VASc 6 (CHF, HTN, age x 2, vascular disease, female); c. on Eliquis   Wears hearing aid in right ear    has.  does not wear    Past Surgical History:  Procedure Laterality Date   ABDOMINAL HYSTERECTOMY     APPENDECTOMY     arm surgery     cartotid endaractomy       Current Outpatient Medications  Medication Sig Dispense Refill   acetaminophen (TYLENOL) 325 MG tablet Take 325 mg by mouth every 6 (six) hours as needed for moderate pain.     albuterol (PROVENTIL HFA;VENTOLIN HFA) 108 (90 Base) MCG/ACT inhaler Inhale 2 puffs into the lungs every  6 (six) hours as needed for wheezing or shortness of breath. 1 Inhaler 3   albuterol (PROVENTIL) (2.5 MG/3ML) 0.083% nebulizer solution Inhale 2.5 mLs into the lungs every 4 (four) hours as needed for wheezing or shortness of breath.   3   amiodarone (PACERONE) 200 MG tablet Take 1 tablet (200 mg total) by mouth daily. 30 tablet 0   amLODipine (NORVASC) 10 MG tablet Take 10 mg by mouth daily.  4   atorvastatin (LIPITOR) 40 MG  tablet Take 40 mg by mouth daily.  3   FLOVENT HFA 220 MCG/ACT inhaler Inhale 2 puffs into the lungs daily. 1 Inhaler 10   furosemide (LASIX) 20 MG tablet Take 20 mg by mouth daily.  4   hydrALAZINE (APRESOLINE) 100 MG tablet TAKE 1 TABLET (100 MG TOTAL) BY MOUTH 3 (THREE) TIMES DAILY. 270 tablet 0   isosorbide mononitrate (IMDUR) 60 MG 24 hr tablet Take 1 tablet (60 mg total) by mouth daily. 90 tablet 0   levothyroxine (SYNTHROID, LEVOTHROID) 100 MCG tablet Take 100 mcg by mouth daily.  4   lisinopril (ZESTRIL) 40 MG tablet Take 1 tablet (40 mg total) by mouth daily. 90 tablet 3   montelukast (SINGULAIR) 10 MG tablet Take 10 mg by mouth daily.  4   Vitamin D3 (VITAMIN D) 25 MCG tablet Take 2,000 Units by mouth daily.      XARELTO 15 MG TABS tablet TAKE 1 TABLET (15 MG TOTAL) BY MOUTH DAILY WITH SUPPER. 90 tablet 3   carvedilol (COREG) 3.125 MG tablet Take 1 tablet (3.125 mg total) by mouth 2 (two) times daily. 60 tablet 5   No current facility-administered medications for this visit.    Allergies:   Patient has no known allergies.    Social History:  The patient  reports that she has never smoked. She has never used smokeless tobacco. She reports that she does not drink alcohol and does not use drugs.   Family History:  The patient's family history includes Hypertension in her mother.    ROS:  Please see the history of present illness.   Otherwise, review of systems are positive for none.   All other systems are reviewed and negative.    PHYSICAL EXAM: VS:  BP (!) 158/50 (BP Location: Left Arm, Patient Position: Sitting, Cuff Size: Normal)   Pulse (!) 46   Ht 5\' 5"  (1.651 m)   Wt 154 lb (69.9 kg)   SpO2 94%   BMI 25.63 kg/m  , BMI Body mass index is 25.63 kg/m. GEN: Well nourished, well developed, in no acute distress  HEENT: normal  Neck: no JVD, carotid bruits, or masses Cardiac: Regular rate and rhythm and mildly bradycardic; no  rubs, or gallops,no edema .  3/6 systolic  murmur in the aortic area which is mid peaking. Respiratory:  clear to auscultation bilaterally, normal work of breathing GI: soft, nontender, nondistended, + BS MS: no deformity or atrophy  Skin: warm and dry, no rash Neuro:  Strength and sensation are intact Psych: euthymic mood, full affect   EKG:  EKG is ordered today. The ekg ordered today demonstrates sinus bradycardia with first-degree AV block, LVH with QRS widening and repolarization abnormalities.     Recent Labs: 08/01/2021: ALT 13; B Natriuretic Peptide 359.1 08/02/2021: BUN 24; Creatinine, Ser 1.47; Hemoglobin 12.1; Platelets 252; Potassium 3.6; Sodium 138    Lipid Panel    Component Value Date/Time   CHOL 200 08/29/2019 0421   TRIG 98 08/29/2019  0421   HDL 63 08/29/2019 0421   CHOLHDL 3.2 08/29/2019 0421   VLDL 20 08/29/2019 0421   LDLCALC 117 (H) 08/29/2019 0421      Wt Readings from Last 3 Encounters:  09/08/21 154 lb (69.9 kg)  08/01/21 152 lb (68.9 kg)  06/19/21 152 lb 6 oz (69.1 kg)           View : No data to display.            ASSESSMENT AND PLAN:  1. Persistent atrial fibrillation: She is maintaining sinus rhythm with amiodarone 200 mg once daily.  She is mildly bradycardic and thus I elected to decrease carvedilol to 3.125 mg twice daily.    Continue anticoagulation with Xarelto.    2.  Chronic diastolic heart failure: she appears to be euvolemic on small dose furosemide 20 mg daily.  3.  Aortic stenosis: This was moderate on most recent echocardiogram done in September.  I requested a follow-up echocardiogram to be done in 4 months from now.  4.  Carotid disease status post carotid endarterectomy.  Stable  5.  Essential hypertension: Blood pressure is mildly elevated but likely reasonable considering her age.  6.  Hyperlipidemia: Currently on atorvastatin.  She gets her labs done with her primary care physician.  7. Stenosis of the SMA: Does not have any symptoms of chronic  mesenteric ischemia. No indication for intervention.    Disposition:   FU in 4 months.  Signed,  Kathlyn Sacramento, MD  09/10/2021 10:29 AM    [facility]

## 2021-10-01 NOTE — Progress Notes (Signed)
Faxed a request for notes, EKG and labs.

## 2021-10-08 ENCOUNTER — Ambulatory Visit: Payer: Medicare Other | Admitting: Internal Medicine

## 2021-10-11 ENCOUNTER — Other Ambulatory Visit: Payer: Self-pay | Admitting: Cardiovascular Disease

## 2022-01-08 ENCOUNTER — Ambulatory Visit: Payer: Medicare Other | Attending: Cardiovascular Disease

## 2022-01-08 ENCOUNTER — Telehealth: Payer: Self-pay

## 2022-01-08 DIAGNOSIS — I5032 Chronic diastolic (congestive) heart failure: Secondary | ICD-10-CM

## 2022-01-08 LAB — ECHOCARDIOGRAM COMPLETE
AR max vel: 1.15 cm2
AV Area VTI: 1.29 cm2
AV Area mean vel: 1.19 cm2
AV Mean grad: 24.5 mmHg
AV Peak grad: 43.3 mmHg
Ao pk vel: 3.29 m/s
Area-P 1/2: 1.95 cm2
S' Lateral: 2.8 cm
Single Plane A4C EF: 53.5 %

## 2022-01-08 NOTE — Telephone Encounter (Signed)
Called both telephone numbers listed for the patient. Unable to lmom. Both telephone numbers ring out with no voicemail option.

## 2022-01-08 NOTE — Telephone Encounter (Signed)
-----   Message from Wellington Hampshire, MD sent at 01/08/2022  4:19 PM EDT ----- Inform patient that echo was fine.  Normal ejection fraction with stable moderate aortic stenosis. She was bradycardic at the time of the echo with a heart rate of 42 beats per minute.  I recommend stopping carvedilol.

## 2022-01-12 NOTE — Telephone Encounter (Signed)
2nd attempt to contact the patient. Unable to lmom. Both telephone numbers ring out with no voicemail option.  Patient has an appt on 01/15/22 with Dr. Fletcher Anon.

## 2022-01-15 ENCOUNTER — Ambulatory Visit: Payer: Medicare Other | Attending: Cardiovascular Disease | Admitting: Cardiovascular Disease

## 2022-01-15 ENCOUNTER — Encounter: Payer: Self-pay | Admitting: Cardiovascular Disease

## 2022-01-15 VITALS — BP 130/50 | HR 46 | Ht 65.0 in | Wt 151.2 lb

## 2022-01-15 DIAGNOSIS — I5032 Chronic diastolic (congestive) heart failure: Secondary | ICD-10-CM | POA: Diagnosis not present

## 2022-01-15 DIAGNOSIS — I350 Nonrheumatic aortic (valve) stenosis: Secondary | ICD-10-CM

## 2022-01-15 DIAGNOSIS — E785 Hyperlipidemia, unspecified: Secondary | ICD-10-CM

## 2022-01-15 DIAGNOSIS — I779 Disorder of arteries and arterioles, unspecified: Secondary | ICD-10-CM | POA: Diagnosis not present

## 2022-01-15 DIAGNOSIS — I4819 Other persistent atrial fibrillation: Secondary | ICD-10-CM

## 2022-01-15 DIAGNOSIS — I10 Essential (primary) hypertension: Secondary | ICD-10-CM

## 2022-01-15 NOTE — Patient Instructions (Signed)
Medication Instructions:  Your physician has recommended you make the following change in your medication:   STOP Carvedilol   Continue your other medications as prescribed.  *If you need a refill on your cardiac medications before your next appointment, please call your pharmacy*   Lab Work: None ordered If you have labs (blood work) drawn today and your tests are completely normal, you will receive your results only by: [place] (if you have MyChart) OR A paper copy in the mail If you have any lab test that is abnormal or we need to change your treatment, we will call you to review the results.   Testing/Procedures: None ordered   Follow-Up: At [facility], you and your health needs are our priority.  As part of our continuing mission to provide you with exceptional heart care, we have created designated Provider Care Teams.  These Care Teams include your primary Cardiologist (physician) and Advanced Practice Providers (APPs -  Physician Assistants and Nurse Practitioners) who all work together to provide you with the care you need, when you need it.  We recommend signing up for the patient portal called "MyChart".  Sign up information is provided on this After Visit Summary.  MyChart is used to connect with patients for Virtual Visits (Telemedicine).  Patients are able to view lab/test results, encounter notes, upcoming appointments, etc.  Non-urgent messages can be sent to your provider as well.   To learn more about what you can do with MyChart, go to [email or web address].    Your next appointment:   6 month(s)  The format for your next appointment:   In Person  Provider:   You may see Kathlyn Sacramento, MD or one of the following Advanced Practice Providers on your designated Care Team:   Murray Hodgkins, NP Christell Faith, PA-C Cadence Kathlen Mody, PA-C Gerrie Nordmann, NP    Other Instructions N/A  Important Information About [place]

## 2022-01-15 NOTE — Progress Notes (Signed)
Cardiology Office Note   Date:  01/15/2022   ID:  Joan Willis, DOB February 14, 1930, MRN [id number]  PCP:  Leanna Sato, MD  Cardiologist:   Lorine Bears, MD   Chief Complaint  Patient presents with   Other    4 month f/u c/o low HR. Meds reviewed verbally with pt.      History of Present Illness: Joan Willis is a 88 y.o. female who presents for a follow-up visit regarding persistent atrial fibrillation and chronic diastolic heart failure. She has chronic medical conditions including mild aortic stenosis, congenital atrophy of right kidney with chronic kidney disease, carotid disease status post carotid endarterectomy, essential hypertension, hyperlipidemia, hypothyroidism and asthma. She was hospitalized in April 2019 with acute on chronic diastolic heart failure in the setting of bronchitis and poorly controlled blood pressure.  Echocardiogram showed normal LV systolic function with moderate LVH and grade 2 diastolic dysfunction, mild pulmonary hypertension and aortic sclerosis. She was hospitalized in September 2020 with chest pain and shortness of breath and was noted to be in atrial fibrillation with rapid ventricular response.  Lexiscan Myoview showed small defect of mild severity in the apical and anterior location likely due to breast attenuation and overall was a low risk study.  She had a rash with Eliquis and was switched to Xarelto.   She was hospitalized in May of 2021 with A. fib with RVR. She had posttermination pauses and was referred to Dr. Graciela Husbands.  She was treated with flecainide with no recurrent atrial fibrillation.    She is known to have resistant hypertension with atrophied right kidney.  She had renal artery duplex done in July 2021 which showed moderately elevated velocities affecting the left renal artery at 323 cm/s.  Right renal size was only 6.59 cm. We discussed the possibility of proceeding with renal artery angiography and possible revascularization but  the patient and daughter preferred a conservative approach considering her age.  She was switched from flecainide to amiodarone due to recurrent atrial fibrillation.  During last visit, she was mildly bradycardic with a heart rate of 46 bpm.  Thus, I decrease carvedilol to 3.125 mg twice daily. She had an echocardiogram done last month which showed normal LV systolic function with stable moderate aortic stenosis.  Aortic valve mean gradient was 24 mmHg with a valve area of 1.29.  She was noted to be bradycardic during echocardiogram with a heart rate of 42 bpm.  Thus, she was instructed to discontinue carvedilol but we could not reach her.  She has been doing well with no chest pain, shortness of breath or palpitations.  Past Medical History:  Diagnosis Date   (HFpEF) heart failure with preserved ejection fraction (HCC)    a. TTE 9/20 - EF 55 to 60%, no LVH, diastolic dysfunction, normal RV systolic function, mildly dilated left atrium, mild mitral regurgitation, mild tricuspid regurgitation, mild aortic stenosis, moderately elevated PASP   Asthma    Carotid artery disease (HCC)    Hyperlipemia    Hypertension    Hypothyroid    Nonfunctioning kidney    Left kidney "never developed"   PAF (paroxysmal atrial fibrillation) (HCC)    a.  Diagnosed 9/20; b. CHADS2VASc 6 (CHF, HTN, age x 2, vascular disease, female); c. on Eliquis   Wears hearing aid in right ear    has.  does not wear    Past Surgical History:  Procedure Laterality Date   ABDOMINAL HYSTERECTOMY     APPENDECTOMY  arm surgery     cartotid endaractomy       Current Outpatient Medications  Medication Sig Dispense Refill   acetaminophen (TYLENOL) 325 MG tablet Take 325 mg by mouth every 6 (six) hours as needed for moderate pain.     albuterol (PROVENTIL HFA;VENTOLIN HFA) 108 (90 Base) MCG/ACT inhaler Inhale 2 puffs into the lungs every 6 (six) hours as needed for wheezing or shortness of breath. 1 Inhaler 3   albuterol  (PROVENTIL) (2.5 MG/3ML) 0.083% nebulizer solution Inhale 2.5 mLs into the lungs every 4 (four) hours as needed for wheezing or shortness of breath.   3   amiodarone (PACERONE) 200 MG tablet Take 1 tablet (200 mg total) by mouth daily. 30 tablet 0   amLODipine (NORVASC) 10 MG tablet Take 10 mg by mouth daily.  4   atorvastatin (LIPITOR) 40 MG tablet Take 40 mg by mouth daily.  3   FLOVENT HFA 220 MCG/ACT inhaler Inhale 2 puffs into the lungs daily. 1 Inhaler 10   furosemide (LASIX) 20 MG tablet Take 20 mg by mouth daily.  4   hydrALAZINE (APRESOLINE) 100 MG tablet TAKE 1 TABLET (100 MG TOTAL) BY MOUTH 3 (THREE) TIMES DAILY. 270 tablet 0   isosorbide mononitrate (IMDUR) 60 MG 24 hr tablet Take 1 tablet (60 mg total) by mouth daily. 90 tablet 0   levothyroxine (SYNTHROID, LEVOTHROID) 100 MCG tablet Take 100 mcg by mouth daily.  4   lisinopril (ZESTRIL) 40 MG tablet Take 1 tablet (40 mg total) by mouth daily. 90 tablet 3   montelukast (SINGULAIR) 10 MG tablet Take 10 mg by mouth daily.  4   Vitamin D3 (VITAMIN D) 25 MCG tablet Take 2,000 Units by mouth daily.      XARELTO 15 MG TABS tablet TAKE 1 TABLET (15 MG TOTAL) BY MOUTH DAILY WITH SUPPER. 90 tablet 3   No current facility-administered medications for this visit.    Allergies:   Patient has no known allergies.    Social History:  The patient  reports that she has never smoked. She has never used smokeless tobacco. She reports that she does not drink alcohol and does not use drugs.   Family History:  The patient's family history includes Hypertension in her mother.    ROS:  Please see the history of present illness.   Otherwise, review of systems are positive for none.   All other systems are reviewed and negative.    PHYSICAL EXAM: VS:  BP (!) 130/50 (BP Location: Left Arm, Patient Position: Sitting, Cuff Size: Large)   Pulse (!) 46   Ht 5\' 5"  (1.651 m)   Wt 151 lb 4 oz (68.6 kg)   SpO2 97%   BMI 25.17 kg/m  , BMI Body mass index  is 25.17 kg/m. GEN: Well nourished, well developed, in no acute distress  HEENT: normal  Neck: no JVD, carotid bruits, or masses Cardiac: Regular rate and rhythm and mildly bradycardic; no  rubs, or gallops,no edema .  3/6 systolic murmur in the aortic area which is mid peaking. Respiratory:  clear to auscultation bilaterally, normal work of breathing GI: soft, nontender, nondistended, + BS MS: no deformity or atrophy  Skin: warm and dry, no rash Neuro:  Strength and sensation are intact Psych: euthymic mood, full affect   EKG:  EKG is ordered today. The ekg ordered today demonstrates sinus bradycardia with first-degree AV block, LVH with QRS widening and repolarization abnormalities.     Recent Labs:  08/01/2021: ALT 13; B Natriuretic Peptide 359.1 08/02/2021: BUN 24; Creatinine, Ser 1.47; Hemoglobin 12.1; Platelets 252; Potassium 3.6; Sodium 138    Lipid Panel    Component Value Date/Time   CHOL 200 08/29/2019 0421   TRIG 98 08/29/2019 0421   HDL 63 08/29/2019 0421   CHOLHDL 3.2 08/29/2019 0421   VLDL 20 08/29/2019 0421   LDLCALC 117 (H) 08/29/2019 0421      Wt Readings from Last 3 Encounters:  01/15/22 151 lb 4 oz (68.6 kg)  09/08/21 154 lb (69.9 kg)  08/01/21 152 lb (68.9 kg)           No data to display            ASSESSMENT AND PLAN:  1. Persistent atrial fibrillation: She is maintaining sinus rhythm with amiodarone 200 mg once daily.  She continues to be mildly bradycardic even after decreasing carvedilol.  I elected to discontinue carvedilol today.     Continue anticoagulation with Xarelto 15 mg daily.    2.  Chronic diastolic heart failure: she appears to be euvolemic on small dose furosemide 20 mg daily.  3.  Aortic stenosis: Fortunately, her aortic stenosis remains moderate.  4.  Carotid disease status post carotid endarterectomy.  Stable  5.  Essential hypertension: Blood pressure is well controlled.  6.  Hyperlipidemia: Currently on  atorvastatin.  She gets her labs done with her primary care physician.  7. Stenosis of the SMA: Does not have any symptoms of chronic mesenteric ischemia. No indication for intervention.  8.  Renal artery stenosis with atrophied right kidney and moderate stenosis affecting the left renal artery.  No indication for revascularization.   Disposition:   FU in 6 months.  Signed,  Kathlyn Sacramento, MD  01/15/2022 10:44 AM    [place]

## 2022-01-31 ENCOUNTER — Emergency Department: Payer: Medicare Other

## 2022-01-31 ENCOUNTER — Emergency Department
Admission: EM | Admit: 2022-01-31 | Discharge: 2022-01-31 | Disposition: A | Discharge status: Home / Self Care | Payer: Medicare Other | Attending: Emergency Medicine | Admitting: Emergency Medicine

## 2022-01-31 ENCOUNTER — Other Ambulatory Visit: Payer: Self-pay

## 2022-01-31 DIAGNOSIS — W010XXA Fall on same level from slipping, tripping and stumbling without subsequent striking against object, initial encounter: Secondary | ICD-10-CM | POA: Diagnosis not present

## 2022-01-31 DIAGNOSIS — Z7901 Long term (current) use of anticoagulants: Secondary | ICD-10-CM | POA: Insufficient documentation

## 2022-01-31 DIAGNOSIS — S0992XA Unspecified injury of nose, initial encounter: Secondary | ICD-10-CM | POA: Diagnosis present

## 2022-01-31 DIAGNOSIS — M25561 Pain in right knee: Secondary | ICD-10-CM | POA: Diagnosis not present

## 2022-01-31 DIAGNOSIS — I4891 Unspecified atrial fibrillation: Secondary | ICD-10-CM | POA: Insufficient documentation

## 2022-01-31 DIAGNOSIS — S0512XA Contusion of eyeball and orbital tissues, left eye, initial encounter: Secondary | ICD-10-CM | POA: Diagnosis not present

## 2022-01-31 DIAGNOSIS — S022XXA Fracture of nasal bones, initial encounter for closed fracture: Secondary | ICD-10-CM | POA: Diagnosis not present

## 2022-01-31 DIAGNOSIS — I10 Essential (primary) hypertension: Secondary | ICD-10-CM | POA: Diagnosis not present

## 2022-01-31 DIAGNOSIS — S0083XA Contusion of other part of head, initial encounter: Secondary | ICD-10-CM

## 2022-01-31 DIAGNOSIS — S0511XA Contusion of eyeball and orbital tissues, right eye, initial encounter: Secondary | ICD-10-CM | POA: Insufficient documentation

## 2022-01-31 DIAGNOSIS — J449 Chronic obstructive pulmonary disease, unspecified: Secondary | ICD-10-CM | POA: Diagnosis not present

## 2022-01-31 DIAGNOSIS — W19XXXA Unspecified fall, initial encounter: Secondary | ICD-10-CM

## 2022-01-31 MED ORDER — ACETAMINOPHEN 325 MG PO TABS
650.0000 mg | ORAL_TABLET | Freq: Once | ORAL | Status: AC
  Administered 2022-01-31: 650 mg via ORAL
  Filled 2022-01-31: qty 2

## 2022-01-31 MED ORDER — OXYMETAZOLINE HCL 0.05 % NA SOLN
1.0000 | Freq: Once | NASAL | Status: AC
  Administered 2022-01-31: 1 via NASAL
  Filled 2022-01-31: qty 30

## 2022-01-31 MED ORDER — BACITRACIN ZINC 500 UNIT/GM EX OINT
TOPICAL_OINTMENT | Freq: Once | CUTANEOUS | Status: AC
  Filled 2022-01-31: qty 0.9

## 2022-01-31 NOTE — ED Notes (Signed)
ED Provider at bedside. 

## 2022-01-31 NOTE — Discharge Instructions (Addendum)
Your exam and CT scans are normal and reassuring.  No signs of serious head injury or spinal cord injury.  You do have evidence of bilateral nasal bone fractures.  You should keep the head of the bed elevated slightly for the next few days.  She also should avoid blowing your nose for the next 48 hours.  Use dampen cotton swabs to cleanse the nose as needed.  Use the Afrin topically to manage any active nosebleeds.  Follow-up with [facility] after all the swelling and bruising has resolved.  Take OTC extra strength Tylenol as needed for pain.

## 2022-01-31 NOTE — ED Provider Notes (Signed)
[facility] Emergency Department Provider Note     Event Date/Time   First MD Initiated Contact with Patient 01/31/22 1920     (approximate)   History   Fall   HPI  Joan Willis is a 88 y.o. female with a history of HTN, HLD, A-fib on anticoagulation, COPD, presents to the ED following a mechanical fall.  Patient was arriving at church, and got out of her son's car, when she apparently tripped over the parking curb.  She landed forward on her knee and face.  Patient presents to the ED with bruising, swelling, and abrasions over the bridge of the nose.  No reported LOC, presyncope, or preceding weakness.  Patient also endorses some right knee pain.  She denies any chest pain, shortness of breath, or hip pain.  Physical Exam   Triage Vital Signs: ED Triage Vitals  Enc Vitals Group     BP 01/31/22 1750 (!) 150/58     Pulse Rate 01/31/22 1750 63     Resp 01/31/22 1750 18     Temp 01/31/22 1755 98.3 F (36.8 C)     Temp Source 01/31/22 1755 Oral     SpO2 01/31/22 1750 98 %     Weight --      Height --      Head Circumference --      Peak Flow --      Pain Score 01/31/22 1750 8     Pain Loc --      Pain Edu? --      Excl. in [place]? --     Most recent vital signs: Vitals:   01/31/22 1755 01/31/22 2139  BP:  (!) 149/70  Pulse:  64  Resp:  16  Temp: 98.3 F (36.8 C)   SpO2:  96%    General Awake, no distress. NAD HEENT NCAT, except for some significant soft tissue swelling and abrasions across the nasal bridge.  Patient with bilateral periorbital ecchymosis appreciated.  Dried blood is noted in the right nare. PERRL. EOMI. No rhinorrhea. Mucous membranes are moist.  No septal hematoma appreciated. CV:  Good peripheral perfusion.  RESP:  Normal effort.  ABD:  No distention.  MSK:  Right knee without obvious deformity or dislocation.  Normal range of motion on the right knee.   ED Results / Procedures / Treatments   Labs (all labs ordered  are listed, but only abnormal results are displayed) Labs Reviewed - No data to display   EKG   RADIOLOGY  I personally viewed and evaluated these images as part of my medical decision making, as well as reviewing the written report by the radiologist.  ED Provider Interpretation: acute nasal bone fractures  CT Head Wo Contrast  Result Date: 01/31/2022 CLINICAL DATA:  Blunt facial trauma in an 88 year old female. EXAM: CT HEAD WITHOUT CONTRAST CT MAXILLOFACIAL WITHOUT CONTRAST CT CERVICAL SPINE WITHOUT CONTRAST TECHNIQUE: Multidetector CT imaging of the head, cervical spine, and maxillofacial structures were performed using the standard protocol without intravenous contrast. Multiplanar CT image reconstructions of the cervical spine and maxillofacial structures were also generated. RADIATION DOSE REDUCTION: This exam was performed according to the departmental dose-optimization program which includes automated exposure control, adjustment of the mA and/or kV according to patient size and/or use of iterative reconstruction technique. COMPARISON:  None available FINDINGS: CT HEAD FINDINGS Brain: No evidence of acute infarction, hemorrhage, hydrocephalus, extra-axial collection or mass lesion/mass effect. Signs of atrophy and mild chronic microvascular  ischemic change. Vascular: No hyperdense vessel or unexpected calcification. Skull: Normal. Negative for fracture or focal lesion. Signs of nasal bone fractures which are better displayed on the face CT which was acquired concurrently, see below. Soft tissue swelling over the bridge of the nose. Other: Extensive soft tissue swelling over the bridge of the nose. CT MAXILLOFACIAL FINDINGS Osseous: Comminuted bilateral nasal bone fractures. Extensive overlying soft tissue swelling. No additional facial fractures. Mandible is intact. Pterygoid are unremarkable. Nasal bone fractures are mildly displaced on the LEFT and moderately markedly displaced on the  RIGHT with mild depression of the anterior nasal bone. Orbits: No sign of associated orbital fracture. Sinuses: No air-fluid levels in the sinuses. Soft tissues: Soft tissue swelling over the bridge of the nose. No retro bulbar stranding. CT CERVICAL SPINE FINDINGS Alignment: Normal. Skull base and vertebrae: No acute fracture. No primary bone lesion or focal pathologic process. Soft tissues and spinal canal: No prevertebral fluid or swelling. No visible canal hematoma. Disc levels: Multilevel degenerative change in the cervical spine greatest at C1-2, C4-5, C5-6 and C6-C7. Small anterior osteophytes and multilevel uncovertebral spurring. Upper chest: Negative. Other: None IMPRESSION: 1. No acute intracranial pathology. 2. Signs of atrophy and mild chronic microvascular ischemic change. 3. Comminuted bilateral nasal bone fractures with overlying soft tissue swelling. Comminution and displacement worse on the RIGHT with mild depression of the anterior nasal bone. 4. No evidence of acute traumatic injury to the cervical spine. 5. Multilevel degenerative change in the cervical spine, as described above. Electronically Signed   By: Zetta Bills M.D.   On: 01/31/2022 18:51   CT Cervical Spine Wo Contrast  Result Date: 01/31/2022 CLINICAL DATA:  Blunt facial trauma in an 88 year old female. EXAM: CT HEAD WITHOUT CONTRAST CT MAXILLOFACIAL WITHOUT CONTRAST CT CERVICAL SPINE WITHOUT CONTRAST TECHNIQUE: Multidetector CT imaging of the head, cervical spine, and maxillofacial structures were performed using the standard protocol without intravenous contrast. Multiplanar CT image reconstructions of the cervical spine and maxillofacial structures were also generated. RADIATION DOSE REDUCTION: This exam was performed according to the departmental dose-optimization program which includes automated exposure control, adjustment of the mA and/or kV according to patient size and/or use of iterative reconstruction technique.  COMPARISON:  None available FINDINGS: CT HEAD FINDINGS Brain: No evidence of acute infarction, hemorrhage, hydrocephalus, extra-axial collection or mass lesion/mass effect. Signs of atrophy and mild chronic microvascular ischemic change. Vascular: No hyperdense vessel or unexpected calcification. Skull: Normal. Negative for fracture or focal lesion. Signs of nasal bone fractures which are better displayed on the face CT which was acquired concurrently, see below. Soft tissue swelling over the bridge of the nose. Other: Extensive soft tissue swelling over the bridge of the nose. CT MAXILLOFACIAL FINDINGS Osseous: Comminuted bilateral nasal bone fractures. Extensive overlying soft tissue swelling. No additional facial fractures. Mandible is intact. Pterygoid are unremarkable. Nasal bone fractures are mildly displaced on the LEFT and moderately markedly displaced on the RIGHT with mild depression of the anterior nasal bone. Orbits: No sign of associated orbital fracture. Sinuses: No air-fluid levels in the sinuses. Soft tissues: Soft tissue swelling over the bridge of the nose. No retro bulbar stranding. CT CERVICAL SPINE FINDINGS Alignment: Normal. Skull base and vertebrae: No acute fracture. No primary bone lesion or focal pathologic process. Soft tissues and spinal canal: No prevertebral fluid or swelling. No visible canal hematoma. Disc levels: Multilevel degenerative change in the cervical spine greatest at C1-2, C4-5, C5-6 and C6-C7. Small anterior osteophytes and multilevel uncovertebral spurring.  Upper chest: Negative. Other: None IMPRESSION: 1. No acute intracranial pathology. 2. Signs of atrophy and mild chronic microvascular ischemic change. 3. Comminuted bilateral nasal bone fractures with overlying soft tissue swelling. Comminution and displacement worse on the RIGHT with mild depression of the anterior nasal bone. 4. No evidence of acute traumatic injury to the cervical spine. 5. Multilevel degenerative  change in the cervical spine, as described above. Electronically Signed   By: Zetta Bills M.D.   On: 01/31/2022 18:51   CT Maxillofacial WO CM  Result Date: 01/31/2022 CLINICAL DATA:  Blunt facial trauma in an 88 year old female. EXAM: CT HEAD WITHOUT CONTRAST CT MAXILLOFACIAL WITHOUT CONTRAST CT CERVICAL SPINE WITHOUT CONTRAST TECHNIQUE: Multidetector CT imaging of the head, cervical spine, and maxillofacial structures were performed using the standard protocol without intravenous contrast. Multiplanar CT image reconstructions of the cervical spine and maxillofacial structures were also generated. RADIATION DOSE REDUCTION: This exam was performed according to the departmental dose-optimization program which includes automated exposure control, adjustment of the mA and/or kV according to patient size and/or use of iterative reconstruction technique. COMPARISON:  None available FINDINGS: CT HEAD FINDINGS Brain: No evidence of acute infarction, hemorrhage, hydrocephalus, extra-axial collection or mass lesion/mass effect. Signs of atrophy and mild chronic microvascular ischemic change. Vascular: No hyperdense vessel or unexpected calcification. Skull: Normal. Negative for fracture or focal lesion. Signs of nasal bone fractures which are better displayed on the face CT which was acquired concurrently, see below. Soft tissue swelling over the bridge of the nose. Other: Extensive soft tissue swelling over the bridge of the nose. CT MAXILLOFACIAL FINDINGS Osseous: Comminuted bilateral nasal bone fractures. Extensive overlying soft tissue swelling. No additional facial fractures. Mandible is intact. Pterygoid are unremarkable. Nasal bone fractures are mildly displaced on the LEFT and moderately markedly displaced on the RIGHT with mild depression of the anterior nasal bone. Orbits: No sign of associated orbital fracture. Sinuses: No air-fluid levels in the sinuses. Soft tissues: Soft tissue swelling over the bridge  of the nose. No retro bulbar stranding. CT CERVICAL SPINE FINDINGS Alignment: Normal. Skull base and vertebrae: No acute fracture. No primary bone lesion or focal pathologic process. Soft tissues and spinal canal: No prevertebral fluid or swelling. No visible canal hematoma. Disc levels: Multilevel degenerative change in the cervical spine greatest at C1-2, C4-5, C5-6 and C6-C7. Small anterior osteophytes and multilevel uncovertebral spurring. Upper chest: Negative. Other: None IMPRESSION: 1. No acute intracranial pathology. 2. Signs of atrophy and mild chronic microvascular ischemic change. 3. Comminuted bilateral nasal bone fractures with overlying soft tissue swelling. Comminution and displacement worse on the RIGHT with mild depression of the anterior nasal bone. 4. No evidence of acute traumatic injury to the cervical spine. 5. Multilevel degenerative change in the cervical spine, as described above. Electronically Signed   By: Zetta Bills M.D.   On: 01/31/2022 18:51   DG Knee Complete 4 Views Right  Result Date: 01/31/2022 CLINICAL DATA:  Fall and trauma to the right knee. EXAM: RIGHT KNEE - COMPLETE 4+ VIEW COMPARISON:  None Available. FINDINGS: There is no acute fracture or dislocation. The bones are osteopenic. Mild arthritic changes of the knee. No joint effusion. The soft tissues are unremarkable. IMPRESSION: 1. No acute fracture or dislocation. 2. Osteopenia. Electronically Signed   By: Anner Crete M.D.   On: 01/31/2022 18:23     PROCEDURES:  Critical Care performed: No  Procedures   MEDICATIONS ORDERED IN ED: Medications  acetaminophen (TYLENOL) tablet 650 mg (650 mg  Oral Given 01/31/22 2052)  oxymetazoline (AFRIN) 0.05 % nasal spray 1 spray (1 spray Each Nare Given 01/31/22 2052)  bacitracin ointment ( Topical Given 01/31/22 2139)     IMPRESSION / MDM / ASSESSMENT AND PLAN / ED COURSE  I reviewed the triage vital signs and the nursing notes.                               Differential diagnosis includes, but is not limited to, SDH, facial fracture, orbital fractures, nasal fracture, cervical spine fracture, cervical dislocation, knee fracture, knee dislocation   Patient's presentation is most consistent with acute presentation with potential threat to life or bodily function.  Geriatric patient to the ED for evaluation of injury sustained following mechanical fall.  She presents to the ED from church with complaints of fall resulting in blunt facial trauma.  After multiple CTs were performed, patient was found to have evidence of bilateral nasal bone fractures with some location on the right.  Patient's diagnosis is consistent with mechanical fall resulting in blunt facial trauma and subsequent nasal bone fractures.. Patient will be discharged home with nosebleed precautions and wound care instructions. Patient is to follow up with PCP and [facility] as needed or otherwise directed. Patient is given ED precautions to return to the ED for any worsening or new symptoms.     FINAL CLINICAL IMPRESSION(S) / ED DIAGNOSES   Final diagnoses:  Fall, initial encounter  Contusion of face, initial encounter  Closed fracture of nasal bone, initial encounter     Rx / DC Orders   ED Discharge Orders     None        Note:  This document was prepared using Dragon voice recognition software and may include unintentional dictation errors. [address] Dannielle Karvonen, PA-C 01/31/22 2330    Nance Pear, MD 01/31/22 2352

## 2022-01-31 NOTE — ED Notes (Signed)
Pt ambulated to bathroom with assistance.

## 2022-01-31 NOTE — ED Triage Notes (Signed)
Pt to ED via POV from church. Pt states she was getting out of the car and tripped over curb. Pt fell forward and hit concrete. Pt has bruising, swelling and blood noted to bridge of nose. Pt denies LOC. Pt also reports pain in right knee. Pt on blood thinners for afib. Pt denies CP, SOB or hip pain.

## 2022-02-23 ENCOUNTER — Other Ambulatory Visit: Payer: Self-pay | Admitting: Cardiovascular Disease

## 2022-06-21 ENCOUNTER — Other Ambulatory Visit: Payer: Self-pay | Admitting: Medical

## 2022-06-21 DIAGNOSIS — I4819 Other persistent atrial fibrillation: Secondary | ICD-10-CM

## 2023-02-01 IMAGING — CR DG CHEST 2V
2 series · 2 of 2 positions shown · non-contrast
Comparison: 08/28/2019

CLINICAL DATA: Shortness of breath

EXAM:
CHEST - 2 VIEW

[chest lat]
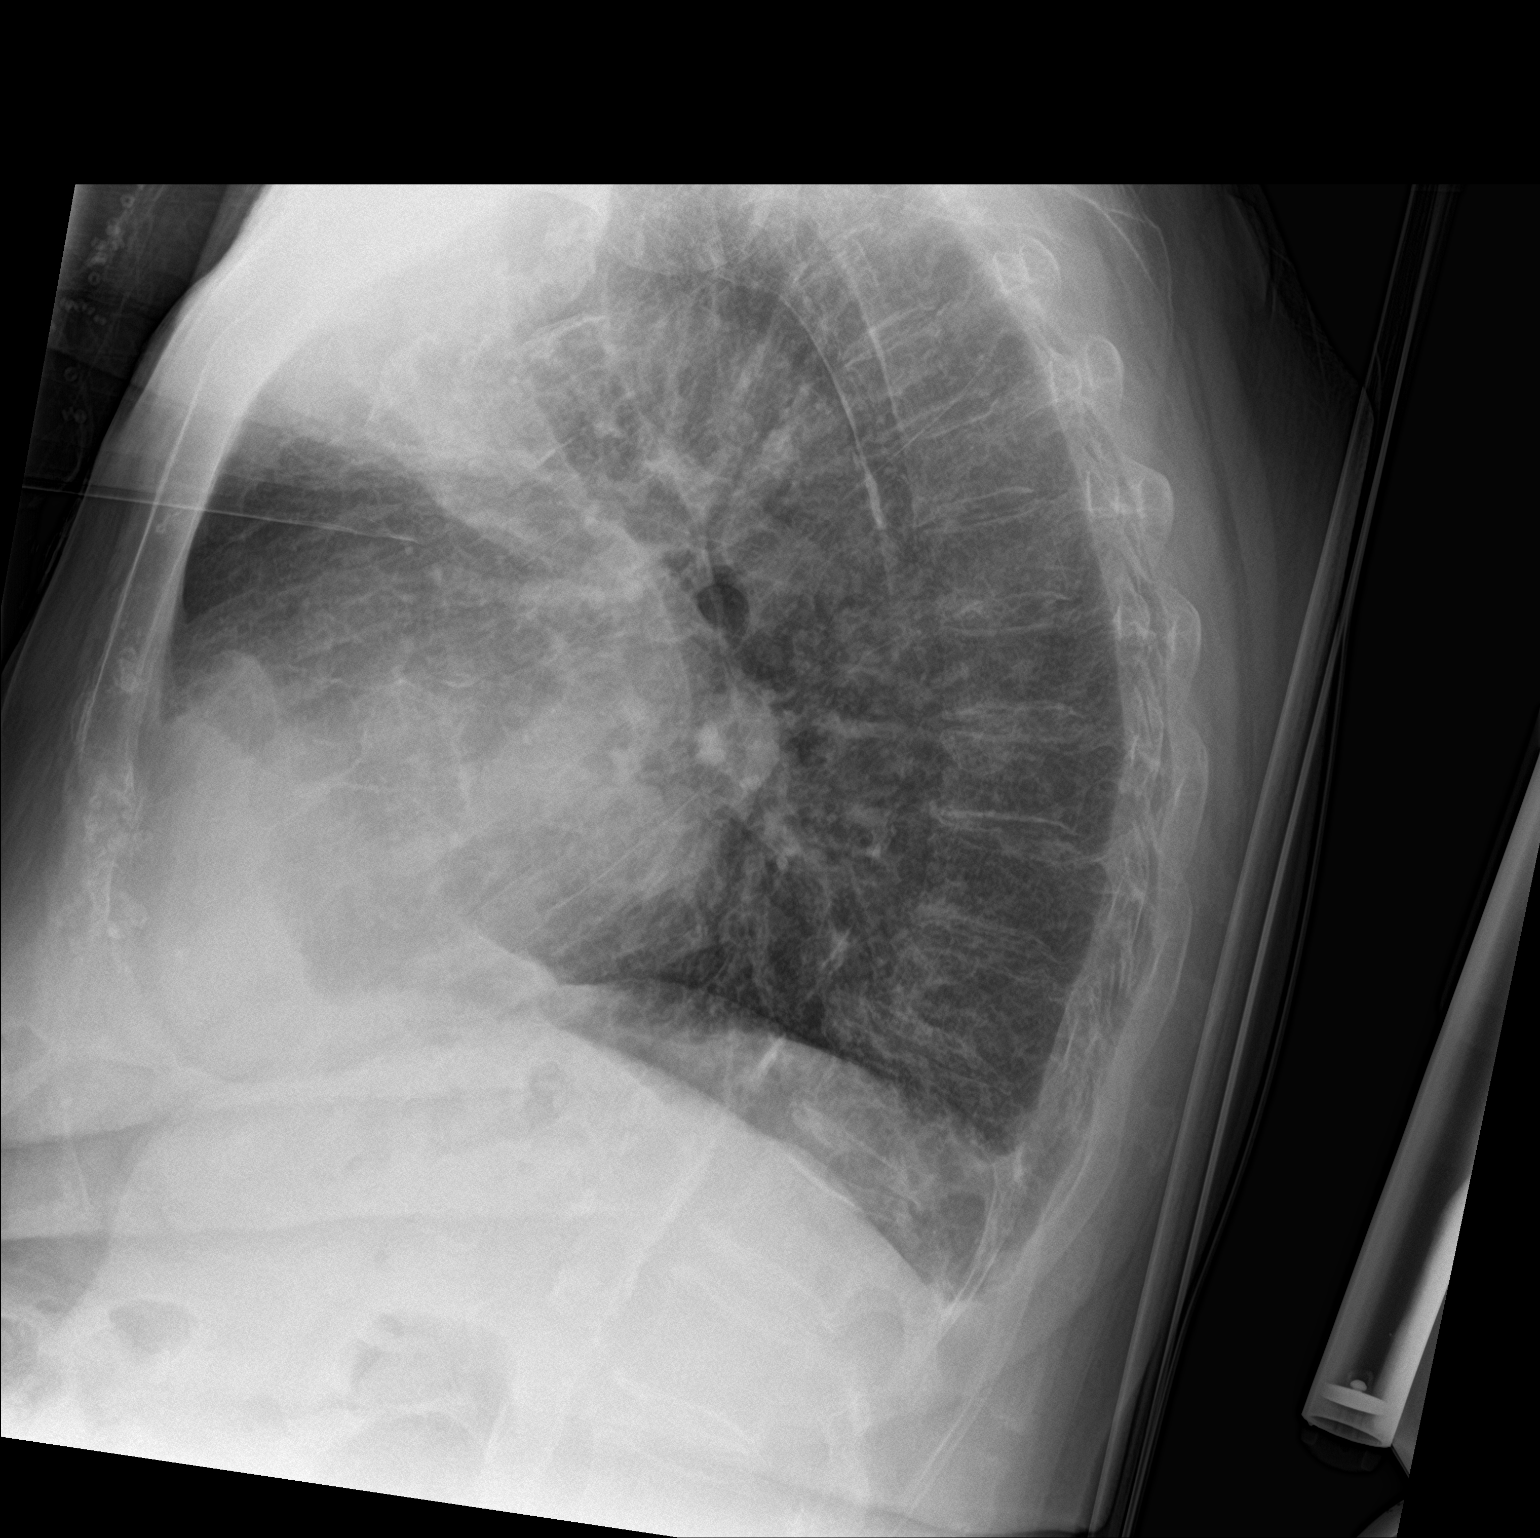

[chest ap]
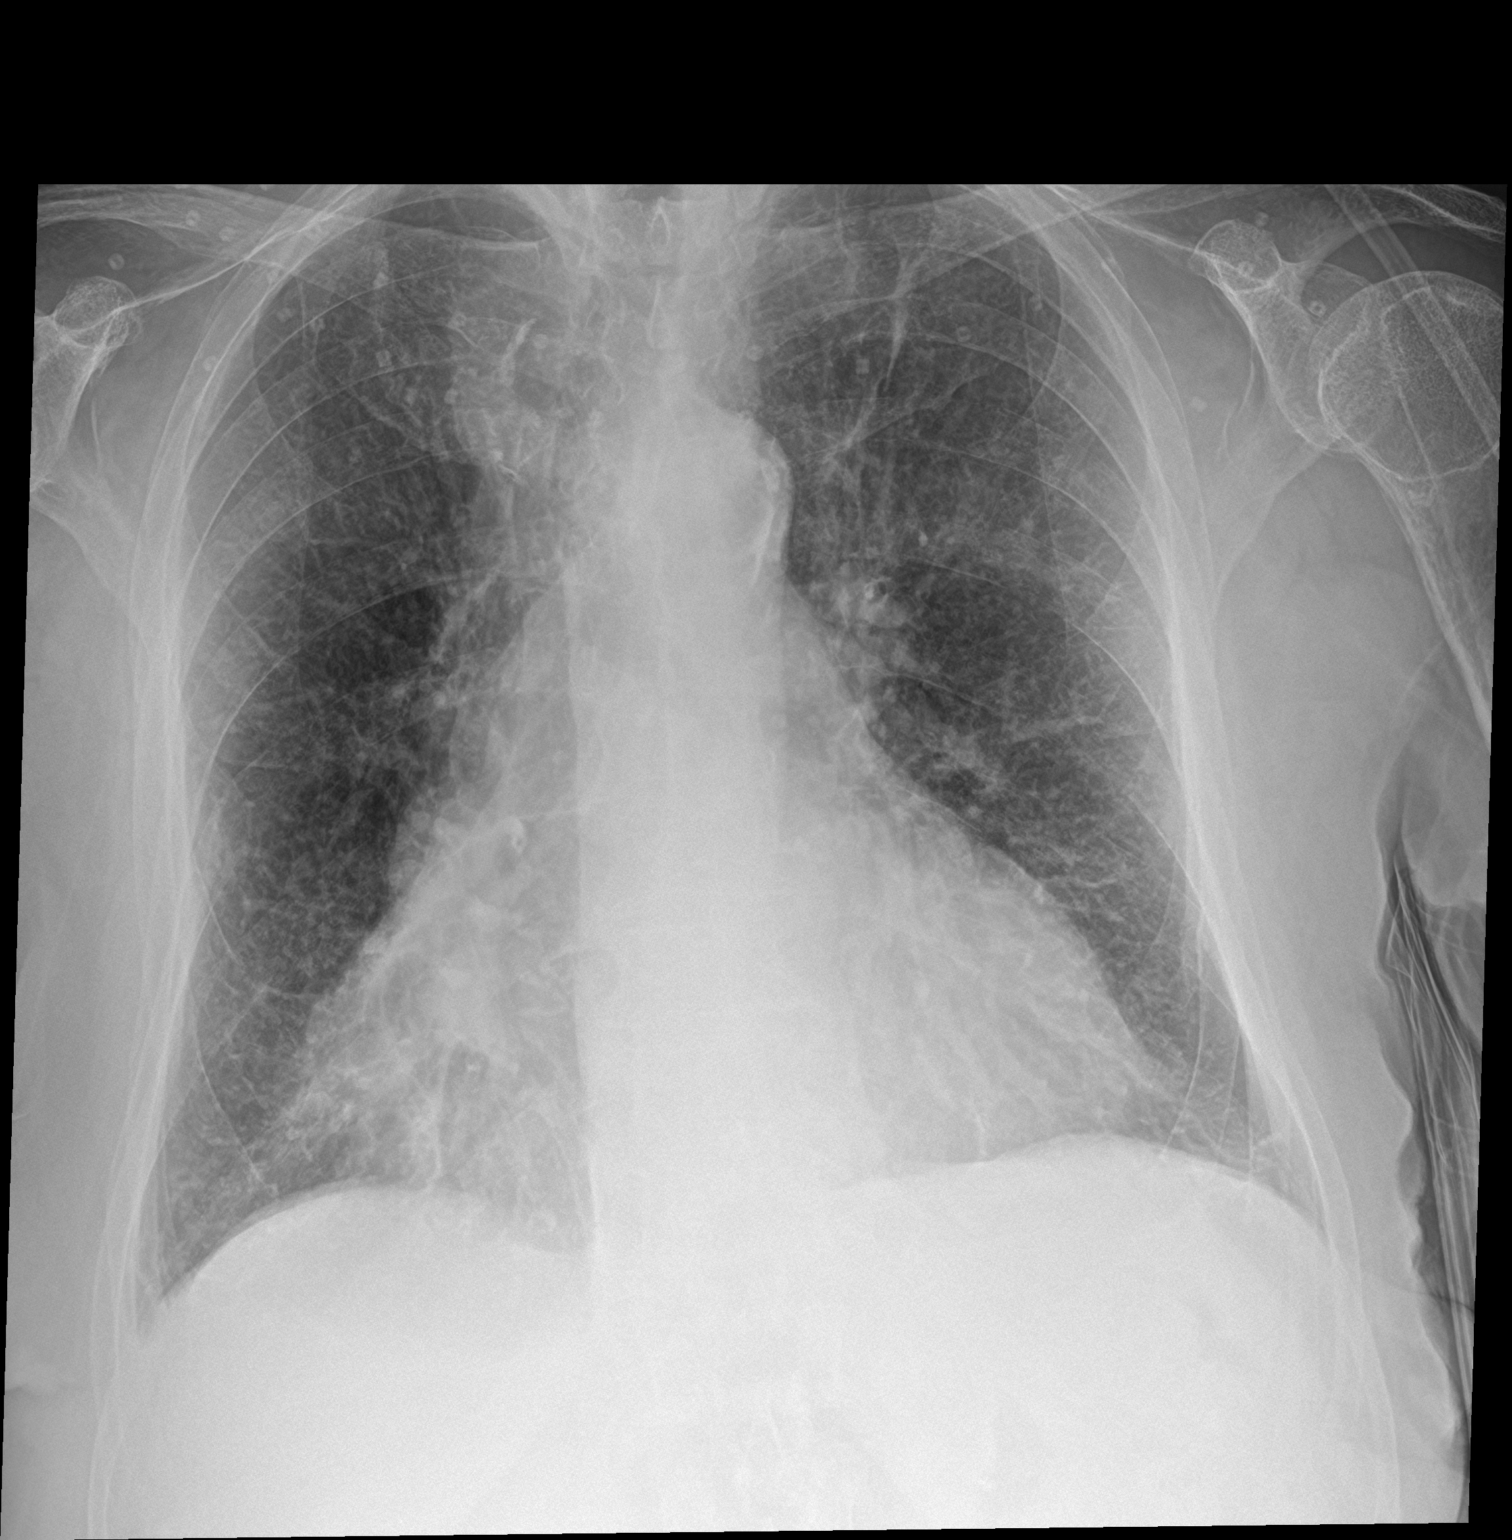

[2 of 2 positions shown; findings below may reference images not displayed]

FINDINGS: Cardiomegaly. No confluent airspace opacities or effusions. No overt
edema. Aortic atherosclerosis. No acute bony abnormality.
IMPRESSION: Cardiomegaly.  No active disease.

## 2023-02-24 ENCOUNTER — Ambulatory Visit: Payer: Medicare Other | Attending: Cardiovascular Disease | Admitting: Cardiovascular Disease

## 2023-02-24 ENCOUNTER — Encounter: Payer: Self-pay | Admitting: Cardiovascular Disease

## 2023-02-24 VITALS — BP 140/60 | HR 61 | Ht 64.0 in | Wt 146.4 lb

## 2023-02-24 DIAGNOSIS — I779 Disorder of arteries and arterioles, unspecified: Secondary | ICD-10-CM | POA: Diagnosis not present

## 2023-02-24 DIAGNOSIS — I350 Nonrheumatic aortic (valve) stenosis: Secondary | ICD-10-CM

## 2023-02-24 DIAGNOSIS — I10 Essential (primary) hypertension: Secondary | ICD-10-CM

## 2023-02-24 DIAGNOSIS — Z79899 Other long term (current) drug therapy: Secondary | ICD-10-CM

## 2023-02-24 DIAGNOSIS — I5032 Chronic diastolic (congestive) heart failure: Secondary | ICD-10-CM | POA: Diagnosis not present

## 2023-02-24 DIAGNOSIS — E785 Hyperlipidemia, unspecified: Secondary | ICD-10-CM

## 2023-02-24 DIAGNOSIS — I4819 Other persistent atrial fibrillation: Secondary | ICD-10-CM | POA: Diagnosis not present

## 2023-02-24 MED ORDER — HYDRALAZINE HCL 100 MG PO TABS: 100.0000 mg | ORAL_TABLET | Freq: Three times a day (TID) | ORAL | 3 refills | Status: DC

## 2023-02-24 MED ORDER — RIVAROXABAN 15 MG PO TABS: 15.0000 mg | ORAL_TABLET | Freq: Every day | ORAL | 3 refills | Status: AC

## 2023-02-24 NOTE — Patient Instructions (Signed)
Medication Instructions:  Your physician recommends that you continue on your current medications as directed. Please refer to the Current Medication list given to you today.  *If you need a refill on your cardiac medications before your next appointment, please call your pharmacy*   Lab Work: Your provider would like for you to have following labs drawn today (CBC, CMP, Lipid, TSH).     Testing/Procedures: None ordered today   Follow-Up: At [facility], you and your health needs are our priority.  As part of our continuing mission to provide you with exceptional heart care, we have created designated Provider Care Teams.  These Care Teams include your primary Cardiologist (physician) and Advanced Practice Providers (APPs -  Physician Assistants and Nurse Practitioners) who all work together to provide you with the care you need, when you need it.  We recommend signing up for the patient portal called "MyChart".  Sign up information is provided on this After Visit Summary.  MyChart is used to connect with patients for Virtual Visits (Telemedicine).  Patients are able to view lab/test results, encounter notes, upcoming appointments, etc.  Non-urgent messages can be sent to your provider as well.   To learn more about what you can do with MyChart, go to [email or web address].    Your next appointment:   6 month(s)  Provider:   You may see Lorine Bears, MD or one of the following Advanced Practice Providers on your designated Care Team:   Nicolasa Ducking, NP Eula Listen, PA-C Cadence Fransico Michael, PA-C Charlsie Quest, NP Carlos Levering, NP

## 2023-02-24 NOTE — Progress Notes (Signed)
Cardiology Office Note   Date:  02/24/2023   ID:  Joan Willis, DOB Jan 22, 1930, MRN [id number]  PCP:  Leanna Sato, MD  Cardiologist:   Lorine Bears, MD   Chief Complaint  Patient presents with   Follow-up    6 month f/u no complaints today. Meds reviewed verbally with pt.      History of Present Illness: Joan Willis is a 89 y.o. female who presents for a follow-up visit regarding persistent atrial fibrillation and chronic diastolic heart failure. 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 She had a rash with Eliquis and was switched to Xarelto.   She is known to have resistant hypertension with atrophied right kidney.  She had renal artery duplex done in July 2021 which showed moderately elevated velocities affecting the left renal artery at 323 cm/s.  Right renal size was only 6.59 cm. We discussed the possibility of proceeding with renal artery angiography and possible revascularization but the patient and daughter preferred a conservative approach considering her age.  She has been maintaining sinus rhythm with amiodarone.  She did develop bradycardia while on small dose carvedilol and this was discontinued during last visit.  Echocardiogram last year showed normal LV systolic function with stable moderate aortic stenosis.  Aortic valve mean gradient was 24 mmHg with a valve area of 1.29.    She has been doing well with no chest pain, shortness of breath or palpitations.   Past Medical History:  Diagnosis Date   (HFpEF) heart failure with preserved ejection fraction (HCC)    a. 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  Diagnosed 9/20; b. CHADS2VASc 6 (CHF, HTN, age x 2, vascular disease, female); c. on Eliquis   Wears hearing aid in right ear    has.  does not wear    Past Surgical History:  Procedure Laterality Date   ABDOMINAL HYSTERECTOMY     APPENDECTOMY     arm surgery     cartotid endaractomy       Current Outpatient Medications  Medication Sig Dispense Refill   acetaminophen (TYLENOL) 325 MG tablet Take 325 mg by mouth every 6 (six) hours as needed for moderate pain.     albuterol (PROVENTIL HFA;VENTOLIN HFA) 108 (90 Base) MCG/ACT inhaler Inhale 2 puffs into the lungs every 6 (six) hours as needed for wheezing or shortness of breath. 1 Inhaler 3   albuterol (PROVENTIL) (2.5 MG/3ML) 0.083% nebulizer solution Inhale 2.5 mLs into the lungs every 4 (four) hours as needed for wheezing or shortness of breath.   3   amiodarone (PACERONE) 200 MG tablet TAKE 1 TABLET BY MOUTH EVERY DAY . STOP SPIRONOLACTONE AND FLECAINIDE 90 tablet 3   amLODipine (NORVASC) 10 MG tablet Take 10 mg by mouth daily.  4   atorvastatin (LIPITOR) 40 MG tablet Take 40 mg by mouth daily.  3   FLOVENT HFA 220 MCG/ACT inhaler Inhale 2 puffs into the lungs daily.  1 Inhaler 10   furosemide (LASIX) 20 MG tablet Take 20 mg by mouth daily.  4   hydrALAZINE (APRESOLINE) 100 MG tablet TAKE 1 TABLET (100 MG TOTAL) BY MOUTH 3 (THREE) TIMES DAILY. 270 tablet 0   isosorbide mononitrate (IMDUR) 60 MG 24 hr tablet Take 1 tablet (60 mg total) by mouth daily. 90 tablet 0   levothyroxine (SYNTHROID, LEVOTHROID) 100 MCG tablet Take 100 mcg by mouth daily.  4   lisinopril (ZESTRIL) 40 MG tablet Take 1 tablet (40 mg total) by mouth daily. 90 tablet 3   montelukast (SINGULAIR) 10 MG tablet Take 10 mg by mouth daily.  4    Vitamin D3 (VITAMIN D) 25 MCG tablet Take 2,000 Units by mouth daily.      XARELTO 15 MG TABS tablet TAKE 1 TABLET (15 MG TOTAL) BY MOUTH DAILY WITH SUPPER. 90 tablet 3   No current facility-administered medications for this visit.    Allergies:   Patient has no known allergies.    Social History:  The patient  reports that she has never smoked. She has never used smokeless tobacco. She reports that she does not drink alcohol and does not use drugs.   Family History:  The patient's family history includes Hypertension in her mother.    ROS:  Please see the history of present illness.   Otherwise, review of systems are positive for none.   All other systems are reviewed and negative.    PHYSICAL EXAM: VS:  BP (!) 140/60 (BP Location: Left Arm, Patient Position: Sitting, Cuff Size: Normal)   Pulse 61   Ht 5\' 4"  (1.626 m)   Wt 146 lb 6 oz (66.4 kg)   SpO2 98%   BMI 25.13 kg/m  , BMI Body mass index is 25.13 kg/m. GEN: Well nourished, well developed, in no acute distress  HEENT: normal  Neck: no JVD, carotid bruits, or masses Cardiac: Regular rate and rhythm; no  rubs, or gallops,no edema .  3/6 systolic murmur in the aortic area which is mid to late peaking. Respiratory:  clear to auscultation bilaterally, normal work of breathing GI: soft, nontender, nondistended, + BS MS: no deformity or atrophy  Skin: warm and dry, no rash Neuro:  Strength and sensation are intact Psych: euthymic mood, full affect   EKG:  EKG is ordered today. The ekg ordered today demonstrates: Sinus rhythm with short PR Left axis deviation Left bundle branch block When compared with ECG of 01-Aug-2021 21:01, PR interval has decreased Left bundle branch block is now Present      Recent Labs: No results found for requested labs within last 365 days.    Lipid Panel    Component Value Date/Time   CHOL 200 08/29/2019 0421   TRIG 98 08/29/2019 0421   HDL 63 08/29/2019 0421   CHOLHDL 3.2  08/29/2019 0421   VLDL 20 08/29/2019 0421   LDLCALC 117 (H) 08/29/2019 0421      Wt Readings from Last 3 Encounters:  02/24/23 146 lb 6 oz (66.4 kg)  01/15/22 151 lb 4 oz (68.6 kg)  09/08/21 154 lb (69.9 kg)           No data to display            ASSESSMENT AND PLAN:  1. Persistent atrial fibrillation: She is maintaining sinus rhythm with amiodarone 200 mg once daily.  Bradycardia improved after stopping carvedilol.  I requested routine labs today.  Continue anticoagulation with Xarelto 15 mg  once daily.  2.  Chronic diastolic heart failure: she appears to be euvolemic on small dose furosemide 20 mg daily.  Check basic metabolic profile.  3.  Aortic stenosis: Moderate aortic stenosis on echocardiogram from last year.  There is likely some progression based on her exam today.  I discussed with her the natural history and management of aortic stenosis.  Overall, I do not think she is a candidate for TAVR and this was discussed with her today and she seems to be in agreement.  Thus, I am not going to obtain an echocardiogram.  4.  Carotid disease status post carotid endarterectomy.  Stable  5.  Essential hypertension: Blood pressure is well controlled.  6.  Hyperlipidemia: Currently on atorvastatin.  I requested a pill profile.  7. Stenosis of the SMA: Does not have any symptoms of chronic mesenteric ischemia. No indication for intervention.  8.  Renal artery stenosis with atrophied right kidney and moderate stenosis affecting the left renal artery.  No indication for revascularization.   Disposition:   FU in 6 months.  Signed,  Lorine Bears, MD  02/24/2023 10:35 AM    [facility]

## 2023-02-25 ENCOUNTER — Other Ambulatory Visit: Payer: Self-pay | Admitting: *Deleted

## 2023-02-25 LAB — COMPREHENSIVE METABOLIC PANEL
ALT: 31 [IU]/L (ref 0–32)
AST: 33 [IU]/L (ref 0–40)
Albumin: 4.1 g/dL (ref 3.6–4.6)
Alkaline Phosphatase: 111 [IU]/L (ref 44–121)
BUN/Creatinine Ratio: 18 (ref 12–28)
BUN: 37 mg/dL — ABNORMAL HIGH (ref 10–36)
Bilirubin Total: 0.7 mg/dL (ref 0.0–1.2)
CO2: 21 mmol/L (ref 20–29)
Calcium: 9.7 mg/dL (ref 8.7–10.3)
Chloride: 99 mmol/L (ref 96–106)
Creatinine, Ser: 2.09 mg/dL — ABNORMAL HIGH (ref 0.57–1.00)
Globulin, Total: 2.6 g/dL (ref 1.5–4.5)
Glucose: 96 mg/dL (ref 70–99)
Potassium: 3.7 mmol/L (ref 3.5–5.2)
Sodium: 141 mmol/L (ref 134–144)
Total Protein: 6.7 g/dL (ref 6.0–8.5)
eGFR: 22 mL/min/{1.73_m2} — ABNORMAL LOW (ref >59)

## 2023-02-25 LAB — CBC
Hematocrit: 40.1 % (ref 34.0–46.6)
Hemoglobin: 13.2 g/dL (ref 11.1–15.9)
MCH: 29.1 pg (ref 26.6–33.0)
MCHC: 32.9 g/dL (ref 31.5–35.7)
MCV: 89 fL (ref 79–97)
Platelets: 249 10*3/uL (ref 150–450)
RBC: 4.53 x10E6/uL (ref 3.77–5.28)
RDW: 13.3 % (ref 11.7–15.4)
WBC: 7 10*3/uL (ref 3.4–10.8)

## 2023-02-25 LAB — LIPID PANEL
Chol/HDL Ratio: 2.6 ratio (ref 0.0–4.4)
Cholesterol, Total: 192 mg/dL (ref 100–199)
HDL: 74 mg/dL (ref >39)
LDL Chol Calc (NIH): 97 mg/dL (ref 0–99)
Triglycerides: 121 mg/dL (ref 0–149)
VLDL Cholesterol Cal: 21 mg/dL (ref 5–40)

## 2023-02-25 LAB — TSH: TSH: 2.31 u[IU]/mL (ref 0.450–4.500)

## 2023-02-25 MED ORDER — FUROSEMIDE 20 MG PO TABS: 20.0000 mg | ORAL_TABLET | ORAL | 4 refills | Status: AC

## 2023-04-27 ENCOUNTER — Other Ambulatory Visit: Payer: Self-pay | Admitting: Cardiovascular Disease

## 2023-04-27 DIAGNOSIS — I4819 Other persistent atrial fibrillation: Secondary | ICD-10-CM

## 2023-10-07 ENCOUNTER — Observation Stay
Admission: EM | Admit: 2023-10-07 | Discharge: 2023-10-08 | Disposition: A | Discharge status: Home / Self Care | Attending: Internal Medicine | Admitting: Internal Medicine

## 2023-10-07 ENCOUNTER — Emergency Department

## 2023-10-07 ENCOUNTER — Other Ambulatory Visit: Payer: Self-pay

## 2023-10-07 ENCOUNTER — Encounter: Payer: Self-pay | Admitting: Emergency Medicine

## 2023-10-07 DIAGNOSIS — E039 Hypothyroidism, unspecified: Secondary | ICD-10-CM | POA: Diagnosis not present

## 2023-10-07 DIAGNOSIS — Z79899 Other long term (current) drug therapy: Secondary | ICD-10-CM | POA: Diagnosis not present

## 2023-10-07 DIAGNOSIS — I480 Paroxysmal atrial fibrillation: Secondary | ICD-10-CM | POA: Diagnosis present

## 2023-10-07 DIAGNOSIS — I10 Essential (primary) hypertension: Secondary | ICD-10-CM | POA: Diagnosis not present

## 2023-10-07 DIAGNOSIS — I110 Hypertensive heart disease with heart failure: Secondary | ICD-10-CM | POA: Diagnosis not present

## 2023-10-07 DIAGNOSIS — R220 Localized swelling, mass and lump, head: Principal | ICD-10-CM

## 2023-10-07 DIAGNOSIS — T783XXA Angioneurotic edema, initial encounter: Principal | ICD-10-CM | POA: Diagnosis present

## 2023-10-07 DIAGNOSIS — J45998 Other asthma: Secondary | ICD-10-CM | POA: Insufficient documentation

## 2023-10-07 DIAGNOSIS — E785 Hyperlipidemia, unspecified: Secondary | ICD-10-CM | POA: Insufficient documentation

## 2023-10-07 DIAGNOSIS — I5030 Unspecified diastolic (congestive) heart failure: Secondary | ICD-10-CM | POA: Insufficient documentation

## 2023-10-07 DIAGNOSIS — J4520 Mild intermittent asthma, uncomplicated: Secondary | ICD-10-CM

## 2023-10-07 DIAGNOSIS — J45909 Unspecified asthma, uncomplicated: Secondary | ICD-10-CM | POA: Insufficient documentation

## 2023-10-07 LAB — CBC WITH DIFFERENTIAL/PLATELET
Abs Immature Granulocytes: 0.01 10*3/uL (ref 0.00–0.07)
Basophils Absolute: 0.1 10*3/uL (ref 0.0–0.1)
Basophils Relative: 1 %
Eosinophils Absolute: 0.1 10*3/uL (ref 0.0–0.5)
Eosinophils Relative: 1 %
HCT: 35.8 % — ABNORMAL LOW (ref 36.0–46.0)
Hemoglobin: 11.4 g/dL — ABNORMAL LOW (ref 12.0–15.0)
Immature Granulocytes: 0 %
Lymphocytes Relative: 21 %
Lymphs Abs: 1.5 10*3/uL (ref 0.7–4.0)
MCH: 28.1 pg (ref 26.0–34.0)
MCHC: 31.8 g/dL (ref 30.0–36.0)
MCV: 88.2 fL (ref 80.0–100.0)
Monocytes Absolute: 0.5 10*3/uL (ref 0.1–1.0)
Monocytes Relative: 7 %
Neutro Abs: 5.1 10*3/uL (ref 1.7–7.7)
Neutrophils Relative %: 70 %
Platelets: 282 10*3/uL (ref 150–400)
RBC: 4.06 MIL/uL (ref 3.87–5.11)
RDW: 15 % (ref 11.5–15.5)
WBC: 7.2 10*3/uL (ref 4.0–10.5)
nRBC: 0 % (ref 0.0–0.2)

## 2023-10-07 LAB — COMPREHENSIVE METABOLIC PANEL WITH GFR
ALT: 19 U/L (ref 0–44)
AST: 28 U/L (ref 15–41)
Albumin: 3.2 g/dL — ABNORMAL LOW (ref 3.5–5.0)
Alkaline Phosphatase: 71 U/L (ref 38–126)
Anion gap: 8 (ref 5–15)
BUN: 28 mg/dL — ABNORMAL HIGH (ref 8–23)
CO2: 26 mmol/L (ref 22–32)
Calcium: 9.1 mg/dL (ref 8.9–10.3)
Chloride: 106 mmol/L (ref 98–111)
Creatinine, Ser: 1.86 mg/dL — ABNORMAL HIGH (ref 0.44–1.00)
GFR, Estimated: 25 mL/min — ABNORMAL LOW (ref >60)
Glucose, Bld: 144 mg/dL — ABNORMAL HIGH (ref 70–99)
Potassium: 3.5 mmol/L (ref 3.5–5.1)
Sodium: 140 mmol/L (ref 135–145)
Total Bilirubin: 1 mg/dL (ref 0.0–1.2)
Total Protein: 6.8 g/dL (ref 6.5–8.1)

## 2023-10-07 LAB — GROUP A STREP BY PCR: Group A Strep by PCR: NOT DETECTED

## 2023-10-07 MED ORDER — MONTELUKAST SODIUM 10 MG PO TABS
10.0000 mg | ORAL_TABLET | Freq: Every day | ORAL | Status: DC
  Administered 2023-10-08: 10 mg via ORAL
  Filled 2023-10-07: qty 1

## 2023-10-07 MED ORDER — FLUTICASONE PROPIONATE HFA 220 MCG/ACT IN AERO: 2.0000 | INHALATION_SPRAY | Freq: Every day | RESPIRATORY_TRACT | Status: DC

## 2023-10-07 MED ORDER — ALBUTEROL SULFATE (2.5 MG/3ML) 0.083% IN NEBU: 2.5000 mL | INHALATION_SOLUTION | RESPIRATORY_TRACT | Status: DC | PRN

## 2023-10-07 MED ORDER — ONDANSETRON HCL 4 MG PO TABS: 4.0000 mg | ORAL_TABLET | Freq: Four times a day (QID) | ORAL | Status: DC | PRN

## 2023-10-07 MED ORDER — ATORVASTATIN CALCIUM 20 MG PO TABS
40.0000 mg | ORAL_TABLET | Freq: Every day | ORAL | Status: DC
  Administered 2023-10-08: 40 mg via ORAL
  Filled 2023-10-07: qty 2

## 2023-10-07 MED ORDER — SODIUM CHLORIDE 0.9 % IV SOLN: INTRAVENOUS | Status: DC

## 2023-10-07 MED ORDER — METHYLPREDNISOLONE SODIUM SUCC 125 MG IJ SOLR
125.0000 mg | Freq: Once | INTRAMUSCULAR | Status: DC
  Filled 2023-10-07: qty 2

## 2023-10-07 MED ORDER — HYDRALAZINE HCL 50 MG PO TABS
100.0000 mg | ORAL_TABLET | Freq: Three times a day (TID) | ORAL | Status: DC
  Administered 2023-10-08: 100 mg via ORAL
  Filled 2023-10-07: qty 2

## 2023-10-07 MED ORDER — VITAMIN D 25 MCG (1000 UNIT) PO TABS
2000.0000 [IU] | ORAL_TABLET | Freq: Every day | ORAL | Status: DC
  Administered 2023-10-08: 2000 [IU] via ORAL
  Filled 2023-10-07: qty 2

## 2023-10-07 MED ORDER — ISOSORBIDE MONONITRATE ER 60 MG PO TB24
60.0000 mg | ORAL_TABLET | Freq: Every day | ORAL | Status: DC
  Administered 2023-10-08: 60 mg via ORAL
  Filled 2023-10-07: qty 1

## 2023-10-07 MED ORDER — MAGNESIUM HYDROXIDE 400 MG/5ML PO SUSP: 30.0000 mL | Freq: Every day | ORAL | Status: DC | PRN

## 2023-10-07 MED ORDER — ACETAMINOPHEN 650 MG RE SUPP: 650.0000 mg | Freq: Four times a day (QID) | RECTAL | Status: DC | PRN

## 2023-10-07 MED ORDER — FUROSEMIDE 40 MG PO TABS: 20.0000 mg | ORAL_TABLET | ORAL | Status: DC

## 2023-10-07 MED ORDER — DIPHENHYDRAMINE HCL 50 MG/ML IJ SOLN
25.0000 mg | Freq: Once | INTRAMUSCULAR | Status: DC
  Filled 2023-10-07: qty 1

## 2023-10-07 MED ORDER — ACETAMINOPHEN 325 MG PO TABS: 650.0000 mg | ORAL_TABLET | Freq: Four times a day (QID) | ORAL | Status: DC | PRN

## 2023-10-07 MED ORDER — METHYLPREDNISOLONE SODIUM SUCC 125 MG IJ SOLR
125.0000 mg | Freq: Once | INTRAMUSCULAR | Status: AC
  Administered 2023-10-08: 125 mg via INTRAVENOUS
  Filled 2023-10-07: qty 2

## 2023-10-07 MED ORDER — AMIODARONE HCL 200 MG PO TABS
200.0000 mg | ORAL_TABLET | Freq: Every day | ORAL | Status: DC
  Administered 2023-10-08: 200 mg via ORAL
  Filled 2023-10-07: qty 1

## 2023-10-07 MED ORDER — LEVOTHYROXINE SODIUM 100 MCG PO TABS
100.0000 ug | ORAL_TABLET | Freq: Every day | ORAL | Status: DC
  Administered 2023-10-08: 100 ug via ORAL
  Filled 2023-10-07: qty 1

## 2023-10-07 MED ORDER — ONDANSETRON HCL 4 MG/2ML IJ SOLN: 4.0000 mg | Freq: Four times a day (QID) | INTRAMUSCULAR | Status: DC | PRN

## 2023-10-07 MED ORDER — AMLODIPINE BESYLATE 5 MG PO TABS
10.0000 mg | ORAL_TABLET | Freq: Every day | ORAL | Status: DC
  Administered 2023-10-08: 10 mg via ORAL
  Filled 2023-10-07: qty 2

## 2023-10-07 MED ORDER — METHYLPREDNISOLONE SODIUM SUCC 125 MG IJ SOLR
80.0000 mg | INTRAMUSCULAR | Status: DC
  Administered 2023-10-08: 80 mg via INTRAVENOUS
  Filled 2023-10-07: qty 2

## 2023-10-07 MED ORDER — TRAZODONE HCL 50 MG PO TABS
25.0000 mg | ORAL_TABLET | Freq: Every evening | ORAL | Status: DC | PRN
Start: 2023-10-07 — End: 2023-10-08

## 2023-10-07 MED ORDER — RIVAROXABAN 15 MG PO TABS: 15.0000 mg | ORAL_TABLET | Freq: Every day | ORAL | Status: DC

## 2023-10-07 NOTE — ED Notes (Signed)
 Pt assisted to bathroom. Pillow and warm blanket provided. Stretcher in lowest position with wheels locked. Side rails up x 2. Call light within reach.

## 2023-10-07 NOTE — ED Provider Notes (Signed)
 [facility] Provider Note    Event Date/Time   First MD Initiated Contact with Patient 10/07/23 2030     (approximate)   History   Chief Complaint Facial Swelling and Sore Throat   HPI  Joan Willis is a 89 y.o. female with past medical history of hypertension, atrial fibrillation on Xarelto , COPD, and CHF who presents to the ED complaining facial swelling.  Patient reports that over the course of the day today she has had increasing pain in the back of her throat as well as swelling over the left side of her face.  She states that symptoms have been gradually worsening and denies any associated itching, rash, nausea, vomiting, diarrhea, or lightheadedness.  She denies any pain or swelling inside of her mouth, states that her throat is sore when she goes to swallow, but she has not noticed any fevers.     Physical Exam   Triage Vital Signs: ED Triage Vitals  Encounter Vitals Group     BP 10/07/23 2016 (!) 160/49     Girls Systolic BP Percentile --      Girls Diastolic BP Percentile --      Boys Systolic BP Percentile --      Boys Diastolic BP Percentile --      Pulse Rate 10/07/23 2016 66     Resp 10/07/23 2016 18     Temp 10/07/23 2016 98.1 F (36.7 C)     Temp Source 10/07/23 2016 Oral     SpO2 10/07/23 2016 97 %     Weight 10/07/23 2017 136 lb (61.7 kg)     Height 10/07/23 2017 5' 5 (1.651 m)     Head Circumference --      Peak Flow --      Pain Score 10/07/23 2017 9     Pain Loc --      Pain Education --      Exclude from Growth Chart --     Most recent vital signs: Vitals:   10/07/23 2016 10/07/23 2330  BP: (!) 160/49 (!) 139/52  Pulse: 66 (!) 55  Resp: 18 14  Temp: 98.1 F (36.7 C)   SpO2: 97% 97%    Constitutional: Alert and oriented. Eyes: Conjunctivae are normal. Head: Atraumatic. Nose: No congestion/rhinnorhea. Mouth/Throat: Mucous membranes are moist.  No gum erythema, edema, or fluctuance noted.  Posterior  oropharynx with mild erythema and edema, no exudates noted. Neck: Tender cervical lymphadenopathy noted on the left.  Mild edema over the left lower jaw. Cardiovascular: Normal rate, regular rhythm. Grossly normal heart sounds.  2+ radial pulses bilaterally. Respiratory: Normal respiratory effort.  No retractions. Lungs CTAB. Gastrointestinal: Soft and nontender. No distention. Musculoskeletal: No lower extremity tenderness nor edema.  Neurologic:  Normal speech and language. No gross focal neurologic deficits are appreciated.    ED Results / Procedures / Treatments   Labs (all labs ordered are listed, but only abnormal results are displayed) Labs Reviewed  CBC WITH DIFFERENTIAL/PLATELET - Abnormal; Notable for the following components:      Result Value   Hemoglobin 11.4 (*)    HCT 35.8 (*)    All other components within normal limits  COMPREHENSIVE METABOLIC PANEL WITH GFR - Abnormal; Notable for the following components:   Glucose, Bld 144 (*)    BUN 28 (*)    Creatinine, Ser 1.86 (*)    Albumin 3.2 (*)    GFR, Estimated 25 (*)    All other  components within normal limits  GROUP A STREP BY PCR     RADIOLOGY CT soft tissue neck reviewed and interpreted by me, shows edema of the face and oropharynx but no focal fluid collection.  PROCEDURES:  Critical Care performed: No  Procedures   MEDICATIONS ORDERED IN ED: Medications - No data to display    IMPRESSION / MDM / ASSESSMENT AND PLAN / ED COURSE  I reviewed the triage vital signs and the nursing notes.                              89 y.o. female with past medical history of hypertension, atrial fibrillation on Xarelto , COPD, and CHF who presents to the ED complaining of about 12 hours of increasing sore throat with left-sided facial pain and swelling.  Patient's presentation is most consistent with acute presentation with potential threat to life or bodily function.  Differential diagnosis includes, but is not  limited to, anaphylaxis, angioedema, pharyngitis, PTA, epiglottitis, parotitis, dental infection.  Patient nontoxic-appearing and in no acute distress, vital signs are unremarkable.  She has tender left-sided cervical lymphadenopathy, mild edema over her left lower face, and mild edema to her posterior oropharynx.  Does not appear consistent with allergic reaction or angioedema, will further assess with CT imaging.  Labs are pending at this time.  Labs with stable CKD, no significant anemia, leukocytosis, or electrolyte abnormality noted.  LFTs are unremarkable.  CT imaging shows subcutaneous edema of the left face, prevertebral area, and posterior oropharynx of unclear etiology.  Infection seems less likely at this time, no focal fluid collections noted, and I would favor angioedema secondary to lisinopril .  She has not had any improvement in her swelling during her time in the ED, case discussed with hospitalist for admission for observation.      FINAL CLINICAL IMPRESSION(S) / ED DIAGNOSES   Final diagnoses:  Facial swelling  Angioedema, initial encounter     Rx / DC Orders   ED Discharge Orders     None        Note:  This document was prepared using Dragon voice recognition software and may include unintentional dictation errors.   Willo Dunnings, MD 10/07/23 2337

## 2023-10-07 NOTE — ED Triage Notes (Addendum)
 Pt arrives POV c/o sudden onset of face/ throat swelling that started around 1700. Pt states her throat feels tight/sore.. denies difficulty breathing at this time. Obvious swelling noted to left side of face. Pt denies allergies or known exacerbating factors. No audible stridor noted. NADN.

## 2023-10-08 DIAGNOSIS — E785 Hyperlipidemia, unspecified: Secondary | ICD-10-CM | POA: Insufficient documentation

## 2023-10-08 DIAGNOSIS — J45909 Unspecified asthma, uncomplicated: Secondary | ICD-10-CM | POA: Insufficient documentation

## 2023-10-08 DIAGNOSIS — I10 Essential (primary) hypertension: Secondary | ICD-10-CM

## 2023-10-08 DIAGNOSIS — T783XXA Angioneurotic edema, initial encounter: Secondary | ICD-10-CM | POA: Diagnosis not present

## 2023-10-08 LAB — CBC
HCT: 35.2 % — ABNORMAL LOW (ref 36.0–46.0)
Hemoglobin: 11.2 g/dL — ABNORMAL LOW (ref 12.0–15.0)
MCH: 28.3 pg (ref 26.0–34.0)
MCHC: 31.8 g/dL (ref 30.0–36.0)
MCV: 88.9 fL (ref 80.0–100.0)
Platelets: 259 10*3/uL (ref 150–400)
RBC: 3.96 MIL/uL (ref 3.87–5.11)
RDW: 14.9 % (ref 11.5–15.5)
WBC: 6 10*3/uL (ref 4.0–10.5)
nRBC: 0 % (ref 0.0–0.2)

## 2023-10-08 LAB — BASIC METABOLIC PANEL WITH GFR
Anion gap: 8 (ref 5–15)
BUN: 26 mg/dL — ABNORMAL HIGH (ref 8–23)
CO2: 26 mmol/L (ref 22–32)
Calcium: 8.8 mg/dL — ABNORMAL LOW (ref 8.9–10.3)
Chloride: 107 mmol/L (ref 98–111)
Creatinine, Ser: 1.58 mg/dL — ABNORMAL HIGH (ref 0.44–1.00)
GFR, Estimated: 31 mL/min — ABNORMAL LOW (ref >60)
Glucose, Bld: 122 mg/dL — ABNORMAL HIGH (ref 70–99)
Potassium: 3.4 mmol/L — ABNORMAL LOW (ref 3.5–5.1)
Sodium: 141 mmol/L (ref 135–145)

## 2023-10-08 LAB — TSH: TSH: 0.992 u[IU]/mL (ref 0.350–4.500)

## 2023-10-08 MED ORDER — FAMOTIDINE 20 MG PO TABS: 20.0000 mg | ORAL_TABLET | Freq: Two times a day (BID) | ORAL | 0 refills | Status: AC

## 2023-10-08 MED ORDER — PREDNISONE 20 MG PO TABS: 20.0000 mg | ORAL_TABLET | Freq: Every day | ORAL | Status: DC

## 2023-10-08 MED ORDER — FAMOTIDINE IN NACL 20-0.9 MG/50ML-% IV SOLN
20.0000 mg | Freq: Two times a day (BID) | INTRAVENOUS | Status: DC
  Administered 2023-10-08: 20 mg via INTRAVENOUS
  Filled 2023-10-08: qty 50

## 2023-10-08 MED ORDER — FAMOTIDINE 20 MG PO TABS
20.0000 mg | ORAL_TABLET | Freq: Two times a day (BID) | ORAL | Status: DC
  Administered 2023-10-08: 20 mg via ORAL
  Filled 2023-10-08: qty 1

## 2023-10-08 MED ORDER — LORATADINE 10 MG PO TABS: 10.0000 mg | ORAL_TABLET | Freq: Every day | ORAL | 0 refills | Status: AC

## 2023-10-08 MED ORDER — FUROSEMIDE 40 MG PO TABS
20.0000 mg | ORAL_TABLET | ORAL | Status: DC
  Administered 2023-10-08: 20 mg via ORAL
  Filled 2023-10-08: qty 1

## 2023-10-08 MED ORDER — LORATADINE 10 MG PO TABS
10.0000 mg | ORAL_TABLET | Freq: Every day | ORAL | Status: DC
  Administered 2023-10-08: 10 mg via ORAL
  Filled 2023-10-08: qty 1

## 2023-10-08 MED ORDER — DIPHENHYDRAMINE HCL 25 MG PO CAPS: 25.0000 mg | ORAL_CAPSULE | Freq: Three times a day (TID) | ORAL | Status: DC

## 2023-10-08 MED ORDER — POTASSIUM CHLORIDE 20 MEQ PO PACK
20.0000 meq | PACK | Freq: Once | ORAL | Status: AC
  Administered 2023-10-08: 20 meq via ORAL
  Filled 2023-10-08: qty 1

## 2023-10-08 MED ORDER — PREDNISONE 20 MG PO TABS: 20.0000 mg | ORAL_TABLET | Freq: Every day | ORAL | 0 refills | Status: AC

## 2023-10-08 MED ORDER — BUDESONIDE 0.5 MG/2ML IN SUSP
0.5000 mg | Freq: Two times a day (BID) | RESPIRATORY_TRACT | Status: DC
  Administered 2023-10-08: 0.5 mg via RESPIRATORY_TRACT
  Filled 2023-10-08: qty 2

## 2023-10-08 MED ORDER — DIPHENHYDRAMINE HCL 50 MG/ML IJ SOLN
25.0000 mg | Freq: Four times a day (QID) | INTRAMUSCULAR | Status: DC
  Administered 2023-10-08 (×2): 25 mg via INTRAVENOUS
  Filled 2023-10-08 (×2): qty 1

## 2023-10-08 NOTE — Assessment & Plan Note (Signed)
-   Will continue Singulair  and bronchodilator therapy.

## 2023-10-08 NOTE — ED Notes (Signed)
 Mansy, MD at bedside

## 2023-10-08 NOTE — Assessment & Plan Note (Signed)
-   The patient will be made to an observation progressive bed. - Will place the patient on IV steroid therapy with Solu-Medrol  as well as H1 and H2 blocker therapy. - Will need ACE level as well as C3 and C4 levels. - Will monitor her airways.

## 2023-10-08 NOTE — Assessment & Plan Note (Signed)
 Will continue statin therapy

## 2023-10-08 NOTE — Assessment & Plan Note (Signed)
-   Will continue amiodarone  and Xarelto .

## 2023-10-08 NOTE — ED Notes (Signed)
Purwick placed at this time.

## 2023-10-08 NOTE — ED Notes (Signed)
 Pt given food to try and eat

## 2023-10-08 NOTE — ED Notes (Signed)
 This tech and tech [place] ensured pt was dry and sheets were changed.

## 2023-10-08 NOTE — Assessment & Plan Note (Signed)
Will continue antihypertensive therapy.

## 2023-10-08 NOTE — Assessment & Plan Note (Signed)
-   Will continue Synthroid and check TSH.

## 2023-10-08 NOTE — H&P (Addendum)
 [place]   PATIENT NAME: Joan Willis    MR#:  [id number]  DATE OF BIRTH:  October 02, 1929  DATE OF ADMISSION:  10/07/2023  PRIMARY CARE PHYSICIAN: Buren Rock HERO, MD   Patient is coming from: Home  REQUESTING/REFERRING PHYSICIAN: Willo Dunnings, MD  CHIEF COMPLAINT:   Chief Complaint  Patient presents with   Facial Swelling   Sore Throat    HISTORY OF PRESENT ILLNESS:  Joan Willis is a 89 y.o.-year-old Caucasian female with medical history significant for essential hypertension, HFpEF, dyslipidemia, hypothyroidism and paroxysmal atrial fibrillation on Xarelto , who presented to the emergency room with acute onset of facial swelling before dinner with increasing back pain and sore throat over the day with associated swelling of the left side of the face.  Symptoms have been gradually worsening without itching or rash.  No fever or chills.  No nausea or vomiting or abdominal pain.  No oral or sublingual swelling.  No chest pain or palpitations.  No cough or wheezing or wheezing or hemoptysis.  She admitted to having strep throat about 3 weeks ago for which she took 10 days of amoxicillin.  ED Course: When the patient came to the ER, BP was 160/49 with otherwise normal vital signs.  Labs revealed borderline potassium of 3.5 and a BUN of 28 with a creatinine 1.86 better than previous levels.  Albumin was 3.2.  CBC showed mild anemia.  Group A strep PCR came back negative. EKG as reviewed by me : None Imaging: CT soft tissue neck without contrast revealed the following: 1. Moderate subcutaneous edema in the left face with milder prevertebral, oropharyngeal, and floor of mouth edema. Findings could represent angioedema or infection. No discrete, drainable fluid collection. Patent airway. 2. Bulky calcific atherosclerosis at the right carotid bifurcation with known severe carotid stenosis noted on 2011 CTA neck.  The patient will be admitted to the observation progressive unit  bed for further evaluation and management. PAST MEDICAL HISTORY:   Past Medical History:  Diagnosis Date   (HFpEF) heart failure with preserved ejection fraction (HCC)    a. 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  Diagnosed 9/20; b. CHADS2VASc 6 (CHF, HTN, age x 2, vascular disease, female); c. on Eliquis    Wears hearing aid in right ear    has.  does not wear    PAST SURGICAL HISTORY:   Past Surgical History:  Procedure Laterality Date   ABDOMINAL HYSTERECTOMY     APPENDECTOMY     arm surgery     cartotid endaractomy      SOCIAL HISTORY:   Social History   Tobacco Use   Smoking status: Never   Smokeless tobacco: Never   Tobacco comments:    pt states she has never smoked  Substance Use Topics   Alcohol use: Never    FAMILY HISTORY:   Family History  Problem Relation Age of Onset   Hypertension Mother     DRUG ALLERGIES:  No Known Allergies  REVIEW OF SYSTEMS:   ROS As per history of present illness. All pertinent  systems were reviewed above. Constitutional, HEENT, cardiovascular, respiratory, GI, GU, musculoskeletal, neuro, psychiatric, endocrine, integumentary and hematologic systems were reviewed and are otherwise negative/unremarkable except for positive findings mentioned above in the HPI.   MEDICATIONS AT HOME:   Prior to Admission medications   Medication Sig Start Date End Date Taking? Authorizing Provider  acetaminophen  (TYLENOL ) 325 MG tablet Take 325 mg by mouth every 6 (six) hours as needed for moderate pain.   Yes [provider]  albuterol  (PROVENTIL  HFA;VENTOLIN   HFA) 108 (90 Base) MCG/ACT inhaler Inhale 2 puffs into the lungs every 6 (six) hours as needed for wheezing or shortness of breath. 10/11/17  Yes Linard Alm NOVAK, MD  albuterol  (PROVENTIL ) (2.5 MG/3ML) 0.083% nebulizer solution Inhale 2.5 mLs into the lungs every 4 (four) hours as needed for wheezing or shortness of breath.  09/13/17  Yes [provider]  amiodarone  (PACERONE ) 200 MG tablet TAKE 1 TABLET BY MOUTH EVERY DAY . STOP SPIRONOLACTONE  AND FLECAINIDE  04/27/23  Yes Darron Deatrice LABOR, MD  amLODipine  (NORVASC ) 10 MG tablet Take 10 mg by mouth daily. 05/04/17  Yes [provider]  atorvastatin  (LIPITOR) 40 MG tablet Take 40 mg by mouth daily. 04/18/17  Yes [provider]  FLOVENT  HFA 220 MCG/ACT inhaler Inhale 2 puffs into the lungs daily. 01/17/18  Yes Linard Alm NOVAK, MD  furosemide  (LASIX ) 20 MG tablet Take 1 tablet (20 mg total) by mouth every other day. 02/25/23  Yes Darron Deatrice LABOR, MD  hydrALAZINE  (APRESOLINE ) 100 MG tablet Take 1 tablet (100 mg total) by mouth 3 (three) times daily. 02/24/23  Yes Darron Deatrice LABOR, MD  isosorbide  mononitrate (IMDUR ) 60 MG 24 hr tablet Take 1 tablet (60 mg total) by mouth daily. 03/10/21  Yes Darron Deatrice LABOR, MD  levothyroxine  (SYNTHROID , LEVOTHROID) 100 MCG tablet Take 100 mcg by mouth daily. 04/29/17  Yes [provider]  lidocaine (XYLOCAINE) 2 % solution Use as directed 15 mLs in the mouth or throat every 4 (four) hours as needed. 08/23/23  Yes [provider]  montelukast  (SINGULAIR ) 10 MG tablet Take 10 mg by mouth daily. 07/19/17  Yes [provider]  Rivaroxaban  (XARELTO ) 15 MG TABS tablet Take 1 tablet (15 mg total) by mouth daily with supper. 02/24/23  Yes Darron Deatrice LABOR, MD  Vitamin D3 (VITAMIN D ) 25 MCG tablet Take 2,000 Units by mouth daily.    Yes [provider]      VITAL SIGNS:  Blood pressure (!) 167/57, pulse 61, temperature 97.9 F (36.6 C), temperature source Oral, resp.  rate 17, height 5' 5 (1.651 m), weight 61.7 kg, SpO2 95%.  PHYSICAL EXAMINATION:  Physical Exam  GENERAL:  89 y.o.-year-old Caucasian female patient lying in the bed with no acute distress.  EYES: Pupils equal, round, reactive to light and accommodation. No scleral icterus. Extraocular muscles intact.  HEENT: Head atraumatic, normocephalic.  Mild left facial swelling without tenderness.  No tongue or buccal floor no uvular swelling.  Oropharynx and nasopharynx clear.  NECK:  Supple, no jugular venous distention. No thyroid  enlargement, no tenderness.  LUNGS: Normal breath sounds bilaterally, no wheezing, rales,rhonchi or crepitation. No use of accessory muscles of respiration.  CARDIOVASCULAR: Regular rate and rhythm, S1, S2 normal.  2-3/6 systolic ejection murmur at the left lower sternal border without rubs, or gallops.  ABDOMEN: Soft, nondistended, nontender. Bowel sounds present. No organomegaly or mass.  EXTREMITIES: No pedal edema, cyanosis, or clubbing.  NEUROLOGIC: Cranial nerves II through XII are intact.  Muscle strength 5/5 in all extremities. Sensation intact. Gait not checked.  PSYCHIATRIC: The patient is alert and oriented x 3.  Normal affect and good eye contact. SKIN: No obvious rash, lesion, or ulcer.   LABORATORY PANEL:   CBC Recent Labs  Lab 10/08/23 0337  WBC 6.0  HGB 11.2*  HCT 35.2*  PLT 259   ------------------------------------------------------------------------------------------------------------------  Chemistries  Recent Labs  Lab 10/07/23 2035 10/08/23 0337  NA 140 141  K 3.5 3.4*  CL 106 107  CO2 26 26  GLUCOSE 144* 122*  BUN 28* 26*  CREATININE 1.86* 1.58*  CALCIUM  9.1 8.8*  AST 28  --   ALT 19  --   ALKPHOS 71  --   BILITOT 1.0  --    ------------------------------------------------------------------------------------------------------------------  Cardiac Enzymes No results for input(s): TROPONINI in the last 168  hours. ------------------------------------------------------------------------------------------------------------------  RADIOLOGY:  CT SOFT TISSUE NECK WO CONTRAST Result Date: 10/07/2023 CLINICAL DATA:  Soft tissue infection suspected, neck, xray done. EXAM: CT NECK WITHOUT CONTRAST TECHNIQUE: Multidetector CT imaging of the neck was performed following the standard protocol without intravenous contrast. RADIATION DOSE REDUCTION: This exam was performed according to the departmental dose-optimization program which includes automated exposure control, adjustment of the mA and/or kV according to patient size and/or use of iterative reconstruction technique. COMPARISON:  Soft tissue infection suspected, neck, xray done FINDINGS: Pharynx and larynx: Mild prevertebral edema as well as edema involving the oropharynx edema. Also, suspect mild edema involving the right greater than left floor mouth. Salivary glands: No inflammation, mass, or stone. Thyroid : Normal. Lymph nodes: None enlarged or abnormal density. Vascular: Nondiagnostic evaluation for stenosis given noncontrast technique. Bulky calcific atherosclerosis at the right carotid bifurcation with known severe carotid stenosis noted on 2011 CTA neck. Limited intracranial: Negative. Visualized orbits: Negative. Mastoids and visualized paranasal sinuses: Clear. Skeleton: No acute or aggressive process. Upper chest: Visualized lung apices are clear. Other: Moderate edema in the left face, greatest overlying the left mandible. IMPRESSION: 1. Moderate subcutaneous edema in the left face with milder prevertebral, oropharyngeal, and floor of mouth edema. Findings could represent angioedema or infection. No discrete, drainable fluid collection. Patent airway. 2. Bulky calcific atherosclerosis at the right carotid bifurcation with known severe carotid stenosis noted on 2011 CTA neck. Electronically Signed   By: Gilmore GORMAN Molt M.D.   On: 10/07/2023 22:33       IMPRESSION AND PLAN:  Assessment and Plan: * Angioedema - The patient will be made to an observation progressive bed. - Will place the patient on IV steroid therapy with Solu-Medrol  as well as H1 and H2 blocker therapy. - Will need ACE level as well as C3 and C4 levels. - Will monitor her airways.  Paroxysmal atrial fibrillation (HCC) - Will continue amiodarone  and Xarelto .  Hypothyroidism Will continue Synthroid  and check TSH.  Essential hypertension - Will continue anti-hypertensive therapy.  Asthma, chronic - Will continue Singulair  and bronchodilator therapy.  Dyslipidemia - Will continue statin therapy.   DVT prophylaxis: Lovenox .  Advanced Care Planning:  Code Status: The patient is DNR and DNI.  This was discussed with the patient. Family Communication:  The plan of care was discussed in details with the patient (and family). I answered all questions. The patient agreed to proceed with the above mentioned plan. Further management will depend upon hospital course. Disposition Plan: Back to previous home environment Consults called: none.  All the records are reviewed and case discussed with ED provider.  Status is: Observation  I  certify that at the time of admission, it is my clinical judgment that the patient will require hospital care extending less than 2 midnights.                            Dispo: The patient is from: Home              Anticipated d/c is to: Home              Patient currently is not medically stable to d/c.              Difficult to place patient: No  Madison DELENA Peaches M.D on 10/08/2023 at 4:56 AM  [facility]   From 7 PM-7 AM, contact night-coverage [email or web address]  CC: Primary care physician; [place] HERO, MD

## 2023-10-08 NOTE — ED Notes (Signed)
 Pt underwear and pants soiled w/ urine. Clothing removed. Pt cleaned and New undergarments placed. Pt requests daughter be called to bring her more clothes.

## 2023-10-08 NOTE — Discharge Summary (Signed)
 Physician Discharge Summary   Patient: Joan Willis MRN: [id number] DOB: April 29, 1929  Admit date:     10/07/2023  Discharge date: 10/08/23  Discharge Physician: Joan Willis   PCP: Joan Rock HERO, MD   Recommendations at discharge:   follow-up PCP in 1 to 2 week  Discharge Diagnoses: Principal Problem:   Angioedema Active Problems:   Paroxysmal atrial fibrillation (HCC)   Hypothyroidism   Essential hypertension   Dyslipidemia   Asthma, chronic  Joan Willis is a 89 y.o.-year-old Caucasian female with medical history significant for essential hypertension, HFpEF, dyslipidemia, hypothyroidism and paroxysmal atrial fibrillation on Xarelto , who presented to the emergency room with acute onset of facial swelling before dinner with increasing back pain and sore throat over the day with associated swelling of the left side of the face.  Symptoms have been gradually worsening without itching or rash.  Patient denies any new medications or any food that could have caused swelling. She recent finish course of antibiotic with amoxicillin for strep throat.  Imaging: CT soft tissue neck without contrast revealed the following: 1. Moderate subcutaneous edema in the left face with milder prevertebral, oropharyngeal, and floor of mouth edema. Findings could represent angioedema or infection. No discrete, drainable fluid collection. Patent airway. 2. Bulky calcific atherosclerosis at the right carotid bifurcation with known severe carotid stenosis noted on 2011 CTA neck.   Angioedema-- unclear etiology - switch to PO prednisone , Pepcid and Claritin  - patient tolerated PO diet well. No respiratory distress. Vitals remains stable and facial swelling resolved   Paroxysmal atrial fibrillation (HCC) - continue amiodarone  and Xarelto .   Hypothyroidism - continue Synthroid     Essential hypertension - continue anti-hypertensive therapy.   Asthma, chronic -  continue Singulair  and  bronchodilator therapy.   Dyslipidemia - continue statin therapy.  Overall hemodynamically stable. Will discharge to home. Discharge plan was discussed with patient and daughter agreeable.       Disposition: Home Diet recommendation:  Discharge Diet Orders (From admission, onward)     Start     Ordered   10/08/23 0000  Diet - low sodium heart healthy        10/08/23 1233           Cardiac diet DISCHARGE MEDICATION: Allergies as of 10/08/2023   No Known Allergies      Medication List     TAKE these medications    acetaminophen  325 MG tablet Commonly known as: TYLENOL  Take 325 mg by mouth every 6 (six) hours as needed for moderate pain.   albuterol  (2.5 MG/3ML) 0.083% nebulizer solution Commonly known as: PROVENTIL  Inhale 2.5 mLs into the lungs every 4 (four) hours as needed for wheezing or shortness of breath.   albuterol  108 (90 Base) MCG/ACT inhaler Commonly known as: VENTOLIN  HFA Inhale 2 puffs into the lungs every 6 (six) hours as needed for wheezing or shortness of breath.   amiodarone  200 MG tablet Commonly known as: PACERONE  TAKE 1 TABLET BY MOUTH EVERY DAY . STOP SPIRONOLACTONE  AND FLECAINIDE    amLODipine  10 MG tablet Commonly known as: NORVASC  Take 10 mg by mouth daily.   atorvastatin  40 MG tablet Commonly known as: LIPITOR Take 40 mg by mouth daily.   famotidine 20 MG tablet Commonly known as: PEPCID Take 1 tablet (20 mg total) by mouth 2 (two) times daily for 4 days.   Flovent  HFA 220 MCG/ACT inhaler Generic drug: fluticasone  Inhale 2 puffs into the lungs daily.   furosemide  20 MG tablet  Commonly known as: LASIX  Take 1 tablet (20 mg total) by mouth every other day.   hydrALAZINE  100 MG tablet Commonly known as: APRESOLINE  Take 1 tablet (100 mg total) by mouth 3 (three) times daily.   isosorbide  mononitrate 60 MG 24 hr tablet Commonly known as: IMDUR  Take 1 tablet (60 mg total) by mouth daily.   levothyroxine  100 MCG  tablet Commonly known as: SYNTHROID  Take 100 mcg by mouth daily.   lidocaine 2 % solution Commonly known as: XYLOCAINE Use as directed 15 mLs in the mouth or throat every 4 (four) hours as needed.   loratadine 10 MG tablet Commonly known as: CLARITIN Take 1 tablet (10 mg total) by mouth daily. Start taking on: October 09, 2023   montelukast  10 MG tablet Commonly known as: SINGULAIR  Take 10 mg by mouth daily.   predniSONE  20 MG tablet Commonly known as: DELTASONE  Take 1 tablet (20 mg total) by mouth daily with breakfast for 4 days. Start taking on: October 09, 2023   Rivaroxaban  15 MG Tabs tablet Commonly known as: Xarelto  Take 1 tablet (15 mg total) by mouth daily with supper.   vitamin D3 25 MCG tablet Commonly known as: CHOLECALCIFEROL  Take 2,000 Units by mouth daily.        Follow-up Information     Joan Rock HERO, MD. Schedule an appointment as soon as possible for a visit in 1 week(s).   Specialty: Family Medicine Contact information: [address] [place] [address] [phone]                Discharge Exam: Joan Willis   10/07/23 2017  Weight: 61.7 kg   Alert and oriented times three respiratory clear to auscultation cardiovascular both heart sounds normal facial swelling improved. Oral mucosa soft. No orals or lip swelling neuro- grossly intact  Condition at discharge: fair  The results of significant diagnostics from this hospitalization (including imaging, microbiology, ancillary and laboratory) are listed below for reference.   Imaging Studies: CT SOFT TISSUE NECK WO CONTRAST Result Date: 10/07/2023 CLINICAL DATA:  Soft tissue infection suspected, neck, xray done. EXAM: CT NECK WITHOUT CONTRAST TECHNIQUE: Multidetector CT imaging of the neck was performed following the standard protocol without intravenous contrast. RADIATION DOSE REDUCTION: This exam was performed according to the departmental dose-optimization program which includes  automated exposure control, adjustment of the mA and/or kV according to patient size and/or use of iterative reconstruction technique. COMPARISON:  Soft tissue infection suspected, neck, xray done FINDINGS: Pharynx and larynx: Mild prevertebral edema as well as edema involving the oropharynx edema. Also, suspect mild edema involving the right greater than left floor mouth. Salivary glands: No inflammation, mass, or stone. Thyroid : Normal. Lymph nodes: None enlarged or abnormal density. Vascular: Nondiagnostic evaluation for stenosis given noncontrast technique. Bulky calcific atherosclerosis at the right carotid bifurcation with known severe carotid stenosis noted on 2011 CTA neck. Limited intracranial: Negative. Visualized orbits: Negative. Mastoids and visualized paranasal sinuses: Clear. Skeleton: No acute or aggressive process. Upper chest: Visualized lung apices are clear. Other: Moderate edema in the left face, greatest overlying the left mandible. IMPRESSION: 1. Moderate subcutaneous edema in the left face with milder prevertebral, oropharyngeal, and floor of mouth edema. Findings could represent angioedema or infection. No discrete, drainable fluid collection. Patent airway. 2. Bulky calcific atherosclerosis at the right carotid bifurcation with known severe carotid stenosis noted on 2011 CTA neck. Electronically Signed   By: Gilmore GORMAN Molt M.D.   On: 10/07/2023 22:33  Microbiology: Results for orders placed or performed during the hospital encounter of 10/07/23  Group A Strep by PCR ([facility] Only)     Status: None   Collection Time: 10/07/23  8:52 PM   Specimen: Throat; Sterile Swab  Result Value Ref Range Status   Group A Strep by PCR NOT DETECTED NOT DETECTED Final    Comment: Performed at [facility], [address] [place], [place] [address]    Labs: CBC: Recent Labs  Lab 10/07/23 2035 10/08/23 0337  WBC 7.2 6.0  NEUTROABS 5.1  --   HGB 11.4* 11.2*  HCT 35.8* 35.2*   MCV 88.2 88.9  PLT 282 259   Basic Metabolic Panel: Recent Labs  Lab 10/07/23 2035 10/08/23 0337  NA 140 141  K 3.5 3.4*  CL 106 107  CO2 26 26  GLUCOSE 144* 122*  BUN 28* 26*  CREATININE 1.86* 1.58*  CALCIUM  9.1 8.8*   Liver Function Tests: Recent Labs  Lab 10/07/23 2035  AST 28  ALT 19  ALKPHOS 71  BILITOT 1.0  PROT 6.8  ALBUMIN 3.2*   Discharge time spent: greater than 30 minutes.  Signed: Leita Blanch, MD [facility] 10/08/2023

## 2023-10-10 LAB — ANGIOTENSIN CONVERTING ENZYME: Angiotensin-Converting Enzyme: <5 U/L — ABNORMAL LOW (ref 14–82)

## 2023-10-11 ENCOUNTER — Encounter: Payer: Self-pay | Admitting: *Deleted

## 2023-10-11 LAB — C3 COMPLEMENT: C3 Complement: 96 mg/dL (ref 82–167)

## 2023-10-11 LAB — C4 COMPLEMENT: Complement C4, Body Fluid: 15 mg/dL (ref 12–38)

## 2023-10-13 ENCOUNTER — Encounter: Payer: Self-pay | Admitting: Medical

## 2023-10-13 ENCOUNTER — Ambulatory Visit: Attending: Medical | Admitting: Medical

## 2023-10-13 VITALS — BP 158/60 | HR 54 | Ht 65.0 in | Wt 141.2 lb

## 2023-10-13 DIAGNOSIS — I5032 Chronic diastolic (congestive) heart failure: Secondary | ICD-10-CM

## 2023-10-13 DIAGNOSIS — I779 Disorder of arteries and arterioles, unspecified: Secondary | ICD-10-CM | POA: Diagnosis not present

## 2023-10-13 DIAGNOSIS — E782 Mixed hyperlipidemia: Secondary | ICD-10-CM

## 2023-10-13 DIAGNOSIS — I4819 Other persistent atrial fibrillation: Secondary | ICD-10-CM

## 2023-10-13 DIAGNOSIS — I350 Nonrheumatic aortic (valve) stenosis: Secondary | ICD-10-CM

## 2023-10-13 DIAGNOSIS — I10 Essential (primary) hypertension: Secondary | ICD-10-CM

## 2023-10-13 NOTE — Progress Notes (Signed)
 Cardiology Office Note   Date:  10/13/2023  ID:  YARIELIS FUNARO, DOB 08-18-1929, MRN [id number] PCP: Buren Rock HERO, MD  [facility] Providers Cardiologist:  Deatrice Cage, MD   History of Present Illness Joan Willis is a 89 y.o. 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   The patient had a rash with Eliquis  and was switched to Xarelto .   She has known resistant HTN with atrophied kidney. She had a renal artery  duplex done in July 2021 which showed moderately elevated velocities affecting the left renal artery at 323 cm/s. Right renal size was only 6/59cm. We discussed the possibility of proceeding with renal artery angiography and possible revascularization but the patient and daughter preferred a conservative approach given her age.   The has been in NSR on amiodarone . She has bradycardia on small dose of Coreg . Echo in 2023 showed normal LVSF with stable moderate AS, mean gradient of with a valve area of 1.29.   The patient was last seen 02/2023 and was stable from a cardiac perspective.   She was recently admitted late June 2025 with angioedema.   Today, the patient reports he has been OK. Patient denies any further issues with her mouth. No pain, swelling or trouble swallowing. The patient denies chest pain or SOB. She is needing tooth extraction, unsure if she will need to hold Xarelto . She denies lower leg edema. She denies dizziness or lightheadedness. BP is mildly elevated. BP at home is 130/60.   Studies Reviewed EKG Interpretation Date/Time:  Thursday October 13 2023 11:10:40 EDT Ventricular Rate:  54 PR Interval:  128 QRS Duration:  140 QT Interval:  512 QTC Calculation: 485 R Axis:   -35  Text Interpretation: Sinus bradycardia Left axis deviation Left  ventricular hypertrophy with QRS widening and repolarization abnormality ( R in aVL , Cornell product ) Cannot rule out Septal infarct , age undetermined When compared with ECG of 24-Feb-2023 10:34, PR interval has increased Left bundle branch block is no longer Present Minimal criteria for Septal infarct are now Present Confirmed by Franchester, Dwana Garin ([id number]) on 10/13/2023 11:13:56 AM    Echo 12/2021   1. Left ventricular ejection fraction, by estimation, is 60 to 65%. The  left ventricle has normal function. The left ventricle has no regional  wall motion abnormalities. Left ventricular diastolic parameters are  consistent with Grade II diastolic  dysfunction (pseudonormalization).   2. Right ventricular systolic function is normal. The right ventricular  size is normal. There is mildly elevated pulmonary artery systolic  pressure. The estimated right ventricular systolic pressure is 44.7 mmHg.   3. Left atrial size was mildly dilated.   4. The mitral valve is normal in structure. Mild mitral valve  regurgitation. No evidence of mitral stenosis.   5. The aortic valve is normal in structure. There is moderate  calcification of the aortic valve. Aortic valve regurgitation is not  visualized. Moderate aortic valve stenosis. Aortic valve area, by VTI  measures 1.29 cm. Aortic valve mean gradient  measures 24.5 mmHg. Aortic valve Vmax measures 3.29 m/s.   6. The inferior vena cava is normal in size with greater than 50%  respiratory variability, suggesting right atrial pressure of 3 mmHg.   7. Sinus bradycardia noted, rate 42 bpm   Echo 12/2020   1.  Left ventricular ejection fraction, by estimation, is 60 to 65%. The  left ventricle has normal function. The left ventricle has no regional  wall motion abnormalities. There is mild left ventricular hypertrophy.  Left ventricular diastolic parameters  are consistent with Grade II diastolic dysfunction (pseudonormalization).   2. Right ventricular  systolic function is normal. The right ventricular  size is normal. There is moderately elevated pulmonary artery systolic  pressure.   3. Left atrial size was mildly dilated.   4. The mitral valve is normal in structure. Mild mitral valve  regurgitation. No evidence of mitral stenosis.   5. The aortic valve is calcified. Aortic valve regurgitation is not  visualized. Moderate aortic valve stenosis. Aortic valve area, by VTI  measures 1.85 cm. Aortic valve mean gradient measures 22.5 mmHg.   6. The inferior vena cava is normal in size with greater than 50%  respiratory variability, suggesting right atrial pressure of 3 mmHg.      Physical Exam VS:  BP (!) 158/60 (BP Location: Left Arm, Patient Position: Sitting, Cuff Size: Normal)   Pulse (!) 54   Ht 5' 5 (1.651 m)   Wt 141 lb 4 oz (64.1 kg)   SpO2 97%   BMI 23.51 kg/m        Wt Readings from Last 3 Encounters:  10/13/23 141 lb 4 oz (64.1 kg)  10/07/23 136 lb (61.7 kg)  02/24/23 146 lb 6 oz (66.4 kg)    GEN: Well nourished, well developed in no acute distress NECK: No JVD; No carotid bruits CARDIAC: RRR, + murmurs, no rubs, gallops RESPIRATORY:  Clear to auscultation without rales, wheezing or rhonchi  ABDOMEN: Soft, non-tender, non-distended EXTREMITIES:  No edema; No deformity   ASSESSMENT AND PLAN  Persistent Atrial fibrillation The patient is maintaining sinus bradycardia. We will continue amiodarone  200mg  daily. Continue Xarelto  15mg  daily. Patient says she is needing teeth extraction, however unsure if dentist will want to hold Xarelto . I advised the dental office send over official pre-op request.   Chronic diastolic heart failure The patient is euvolemic on exam. Continue lasix  20mg  daily.   Moderate aortic stenosis Echo in 2023 showed LVEF 60-65%, with moderate AS. Per Dr. Renato prior discussion the patient is not a good TAVR candidate. No plan to repeat echo as symptoms are stable.    Carotid disease s/p  endarterectomy No dizziness and lightheadedness reported. Continue Lipitor.   HTN BP is elevated, but reports it is better at home 130s/60s. We will continue amlodipine  10mg  daily, Hydralazine  100mg  TID, Imdur  60mg  daily and lasix .   HLD LDL 97. Continue Lipitor 40mg  daily.   SMA stenosis No abdominal pain reported.   Renal artery stenosis  RAS with atrophied right kidney and moderate stenosis affecting the left renal artery. No indication for revascularization.       Dispo: Follow-up in 6 months  Signed, Kavir Savoca VEAR Fishman, PA-C

## 2023-10-13 NOTE — Patient Instructions (Signed)
 Medication Instructions:  Your physician recommends that you continue on your current medications as directed. Please refer to the Current Medication list given to you today.   *If you need a refill on your cardiac medications before your next appointment, please call your pharmacy*  Lab Work: None ordered at this time   Follow-Up: At [facility], you and your health needs are our priority.  As part of our continuing mission to provide you with exceptional heart care, our providers are all part of one team.  This team includes your primary Cardiologist (physician) and Advanced Practice Providers or APPs (Physician Assistants and Nurse Practitioners) who all work together to provide you with the care you need, when you need it.  Your next appointment:   6 month(s)  Provider:   You may see Deatrice Cage, MD or one of the following Advanced Practice Providers on your designated Care Team:   Lonni Meager, NP Lesley Maffucci, PA-C Bernardino Bring, PA-C Cadence Brookfield, PA-C Tylene Lunch, NP Barnie Hila, NP   We recommend signing up for the patient portal called MyChart.  Sign up information is provided on this After Visit Summary.  MyChart is used to connect with patients for Virtual Visits (Telemedicine).  Patients are able to view lab/test results, encounter notes, upcoming appointments, etc.  Non-urgent messages can be sent to your provider as well.   To learn more about what you can do with MyChart, go to [email or web address].

## 2023-10-24 ENCOUNTER — Other Ambulatory Visit: Payer: Self-pay | Admitting: Cardiovascular Disease

## 2023-10-24 DIAGNOSIS — I4819 Other persistent atrial fibrillation: Secondary | ICD-10-CM

## 2023-11-09 ENCOUNTER — Telehealth: Payer: Self-pay | Admitting: Cardiovascular Disease

## 2023-11-09 NOTE — Telephone Encounter (Signed)
 Pharmacy please advise on holding Xarelto  prior to Dental Extraction - Amount of Teeth to be Pulled:  11 extractions and bone recontouring  scheduled for TBD. Last labs 10/08/2023. Thank you.

## 2023-11-09 NOTE — Telephone Encounter (Signed)
   Pre-operative Risk Assessment    Patient Name: Joan Willis  DOB: Aug 19, 1929 MRN: [id number]   Date of last office visit: 10/13/23 Date of next office visit: na  Request for Surgical Clearance    Procedure:  Dental Extraction - Amount of Teeth to be Pulled:  11 extractions and bone recontouring  Date of Surgery:  Clearance TBD                                Surgeon:  Dr Comer Molt Surgeon's Group or Practice Name:  [facility] Phone number:  [phone] Fax number:  [phone]   Type of Clearance Requested:   - Medical    Type of Anesthesia:  4% articaine 1:100,000 with epinephrine   Additional requests/questions:  Please advise surgeon/provider what medications should be held.  SignedRosina Stamps   11/09/2023, 9:17 AM

## 2023-11-09 NOTE — Telephone Encounter (Signed)
 On review of chart, patient is on Xarelto .  Please confirm with dentist if they wish us  to recommend holding so that I can contact pharmacy. Thank you!

## 2023-11-09 NOTE — Telephone Encounter (Signed)
 Per DDS need recommendations to hold blood thinner.   I will also confirm with preop APP if the pt will need an appt, tele or in office.

## 2023-11-14 NOTE — Telephone Encounter (Signed)
     Primary Cardiologist: Deatrice Cage, MD  Chart reviewed as part of pre-operative protocol coverage. Given past medical history and time since last visit, based on ACC/AHA guidelines, Joan Willis would be at acceptable risk for the planned procedure without further cardiovascular testing.   Patient has not had an Afib/aflutter ablation within the last 3 months or DCCV within the last 30 days   Patient does not require pre-op antibiotics for dental procedure.   Per office protocol, patient can hold Xarelto  for 3 days prior to procedure.   Patient will not need bridging with Lovenox  (enoxaparin ) around procedure.  I will route this recommendation to the requesting party via Epic fax function and remove from pre-op pool.  Please call with questions.  Joan Willis. Sigrid Schwebach NP-C     11/14/2023, 12:02 PM [facility] [address] Office [phone] Fax [phone]

## 2023-11-14 NOTE — Telephone Encounter (Signed)
 Patient with diagnosis of atrial fibrillation on Xarelto  for anticoagulation.    Procedure:  Dental Extraction - Amount of Teeth to be Pulled:  11 extractions and bone recontouring   Date of Surgery:  Clearance TBD   CHA2DS2-VASc Score = 6   This indicates a 9.7% annual risk of stroke. The patient's score is based upon: CHF History: 1 HTN History: 1 Diabetes History: 0 Stroke History: 0 Vascular Disease History: 1 Age Score: 2 Gender Score: 1    CrCl 23 Platelet count 259  Patient has not had an Afib/aflutter ablation within the last 3 months or DCCV within the last 30 days  Patient does not require pre-op antibiotics for dental procedure.  Per office protocol, patient can hold Xarelto  for 3 days prior to procedure.   Patient will not need bridging with Lovenox  (enoxaparin ) around procedure.  **This guidance is not considered finalized until pre-operative APP has relayed final recommendations.**

## 2024-03-30 ENCOUNTER — Other Ambulatory Visit: Payer: Self-pay | Admitting: Cardiovascular Disease

## 2024-03-30 DIAGNOSIS — I4819 Other persistent atrial fibrillation: Secondary | ICD-10-CM

## 2024-03-30 NOTE — Telephone Encounter (Signed)
 Xarelto  15mg  refill request received. Pt is 89 years old, weight-64.1kg, Crea-1.58 on 10/08/23, last seen by Cadence Furth on 10/13/23, Diagnosis-Afib, CrCl- 22.99 mL/min; Dose is appropriate based on dosing criteria. Will send in refill to requested pharmacy.

## 2024-04-21 ENCOUNTER — Other Ambulatory Visit: Payer: Self-pay | Admitting: Cardiovascular Disease

## 2024-08-01 ENCOUNTER — Ambulatory Visit: Admitting: Cardiovascular Disease
